# Patient Record
Sex: Male | Born: 1950 | Race: Black or African American | Hispanic: No | Smoking: Former smoker
Health system: Southern US, Community
[De-identification: ages and names within clinical notes are randomized; demographics above are authoritative.]

## PROBLEM LIST (undated history)

## (undated) DIAGNOSIS — K219 Gastro-esophageal reflux disease without esophagitis: Secondary | ICD-10-CM

## (undated) DIAGNOSIS — E039 Hypothyroidism, unspecified: Secondary | ICD-10-CM

## (undated) DIAGNOSIS — I10 Essential (primary) hypertension: Secondary | ICD-10-CM

## (undated) DIAGNOSIS — D3A026 Benign carcinoid tumor of the rectum: Secondary | ICD-10-CM

## (undated) DIAGNOSIS — N400 Enlarged prostate without lower urinary tract symptoms: Secondary | ICD-10-CM

## (undated) DIAGNOSIS — R7303 Prediabetes: Secondary | ICD-10-CM

## (undated) DIAGNOSIS — K922 Gastrointestinal hemorrhage, unspecified: Secondary | ICD-10-CM

## (undated) DIAGNOSIS — E785 Hyperlipidemia, unspecified: Secondary | ICD-10-CM

## (undated) HISTORY — DX: Essential (primary) hypertension: I10

## (undated) HISTORY — DX: Gastrointestinal hemorrhage, unspecified: K92.2

## (undated) HISTORY — DX: Hyperlipidemia, unspecified: E78.5

## (undated) HISTORY — DX: Benign prostatic hyperplasia without lower urinary tract symptoms: N40.0

## (undated) HISTORY — DX: Prediabetes: R73.03

## (undated) HISTORY — DX: Benign carcinoid tumor of the rectum: D3A.026

## (undated) HISTORY — PX: TONSILLECTOMY: SUR1361

## (undated) HISTORY — DX: Hypothyroidism, unspecified: E03.9

---

## 2006-04-02 ENCOUNTER — Emergency Department (HOSPITAL_COMMUNITY): Admission: EM | Admit: 2006-04-02 | Discharge: 2006-04-03 | Payer: Self-pay | Admitting: Emergency Medicine

## 2009-10-24 ENCOUNTER — Emergency Department (HOSPITAL_COMMUNITY): Admission: EM | Admit: 2009-10-24 | Discharge: 2009-10-24 | Payer: Self-pay | Admitting: Emergency Medicine

## 2009-10-27 ENCOUNTER — Emergency Department (HOSPITAL_COMMUNITY): Admission: EM | Admit: 2009-10-27 | Discharge: 2009-10-27 | Payer: Self-pay | Admitting: Emergency Medicine

## 2009-10-28 ENCOUNTER — Ambulatory Visit (HOSPITAL_COMMUNITY): Admission: RE | Admit: 2009-10-28 | Discharge: 2009-10-28 | Payer: Self-pay | Admitting: Emergency Medicine

## 2011-02-21 LAB — CBC
HCT: 39.3 % (ref 39.0–52.0)
Hemoglobin: 13.2 g/dL (ref 13.0–17.0)
MCHC: 33.7 g/dL (ref 30.0–36.0)
MCV: 88.7 fL (ref 78.0–100.0)
RDW: 12.5 % (ref 11.5–15.5)

## 2011-02-21 LAB — DIFFERENTIAL
Basophils Absolute: 0 10*3/uL (ref 0.0–0.1)
Basophils Relative: 1 % (ref 0–1)
Eosinophils Absolute: 0.6 10*3/uL (ref 0.0–0.7)
Eosinophils Relative: 8 % — ABNORMAL HIGH (ref 0–5)
Monocytes Absolute: 0.5 10*3/uL (ref 0.1–1.0)

## 2011-02-21 LAB — BASIC METABOLIC PANEL
BUN: 15 mg/dL (ref 6–23)
CO2: 31 mEq/L (ref 19–32)
Glucose, Bld: 92 mg/dL (ref 70–99)
Potassium: 3.8 mEq/L (ref 3.5–5.1)
Sodium: 139 mEq/L (ref 135–145)

## 2012-09-02 LAB — COMPREHENSIVE METABOLIC PANEL
AST: 24 U/L
Albumin: 5.3
BUN: 10 mg/dL (ref 4–21)
Calcium: 9.6 mg/dL
Chloride: 105 mmol/L
Glucose: 85 mg/dL
LDL Cholesterol: 164 mg/dL
Potassium: 4.2 mmol/L
Sodium: 140 mmol/L (ref 137–147)

## 2012-09-02 LAB — CBC: HCT: 41 %

## 2012-09-26 ENCOUNTER — Encounter: Payer: Self-pay | Admitting: *Deleted

## 2012-10-15 ENCOUNTER — Encounter: Payer: Self-pay | Admitting: Gastroenterology

## 2012-10-15 ENCOUNTER — Ambulatory Visit (INDEPENDENT_AMBULATORY_CARE_PROVIDER_SITE_OTHER): Payer: Self-pay | Admitting: Gastroenterology

## 2012-10-15 ENCOUNTER — Other Ambulatory Visit: Payer: Self-pay | Admitting: Gastroenterology

## 2012-10-15 VITALS — BP 154/72 | HR 61 | Temp 97.2°F | Ht 66.0 in | Wt 203.4 lb

## 2012-10-15 DIAGNOSIS — K429 Umbilical hernia without obstruction or gangrene: Secondary | ICD-10-CM

## 2012-10-15 DIAGNOSIS — R195 Other fecal abnormalities: Secondary | ICD-10-CM

## 2012-10-15 NOTE — Patient Instructions (Signed)
We have scheduled you for a colonoscopy with Dr. Darrick Penna. See separate instructions.  Hernia A hernia occurs when an internal organ pushes out through a weak spot in the abdominal wall. Hernias most commonly occur in the groin and around the navel. Hernias often can be pushed back into place (reduced). Most hernias tend to get worse over time. Some abdominal hernias can get stuck in the opening (irreducible or incarcerated hernia) and cannot be reduced. An irreducible abdominal hernia which is tightly squeezed into the opening is at risk for impaired blood supply (strangulated hernia). A strangulated hernia is a medical emergency. Because of the risk for an irreducible or strangulated hernia, surgery may be recommended to repair a hernia. CAUSES   Heavy lifting.  Prolonged coughing.  Straining to have a bowel movement.  A cut (incision) made during an abdominal surgery. HOME CARE INSTRUCTIONS   Bed rest is not required. You may continue your normal activities.  Avoid lifting more than 10 pounds (4.5 kg) or straining.  Cough gently. If you are a smoker it is best to stop. Even the best hernia repair can break down with the continual strain of coughing. Even if you do not have your hernia repaired, a cough will continue to aggravate the problem.  Do not wear anything tight over your hernia. Do not try to keep it in with an outside bandage or truss. These can damage abdominal contents if they are trapped within the hernia sac.  Eat a normal diet.  Avoid constipation. Straining over long periods of time will increase hernia size and encourage breakdown of repairs. If you cannot do this with diet alone, stool softeners may be used. SEEK IMMEDIATE MEDICAL CARE IF:   You have a fever.  You develop increasing abdominal pain.  You feel nauseous or vomit.  Your hernia is stuck outside the abdomen, looks discolored, feels hard, or is tender.  You have any changes in your bowel habits or in  the hernia that are unusual for you.  You have increased pain or swelling around the hernia.  You cannot push the hernia back in place by applying gentle pressure while lying down. MAKE SURE YOU:   Understand these instructions.  Will watch your condition.  Will get help right away if you are not doing well or get worse. Document Released: 11/06/2005 Document Revised: 01/29/2012 Document Reviewed: 06/25/2008 [facility] Patient Information 2013 [place], [place].

## 2012-10-15 NOTE — Assessment & Plan Note (Signed)
Colonoscopy in the near future for Hemoccult-positive stool.  I have discussed the risks, alternatives, benefits with regards to but not limited to the risk of reaction to medication, bleeding, infection, perforation and the patient is agreeable to proceed. Written consent to be obtained.

## 2012-10-15 NOTE — Progress Notes (Signed)
Faxed to PCP

## 2012-10-15 NOTE — Assessment & Plan Note (Signed)
Small umbilical hernia. Discussed warning signs. Handout provided.

## 2012-10-15 NOTE — Progress Notes (Signed)
Primary Care Physician:  Tylene Fantasia., PA  Primary Gastroenterologist:  Jonette Eva, MD  Chief Complaint  Patient presents with  . Rectal Bleeding    Heme + cards    HPI:  Tim Cunningham is a 61 y.o. male here to schedule colonoscopy. Recently evaluated [facility] [employer]. He was Hemoccult-positive. No constipation, diarrhea, melena, brbpr.  Occasional soreness around umbilicus. Noticed with lifting. No heartburn. No vomiting, dysphagia. No weight loss. No prior colonoscopy.  Colon cancer in extended family member, maternal great uncle.  Current Outpatient Prescriptions  Medication Sig Dispense Refill  . aspirin 81 MG tablet Take 81 mg by mouth daily.      . pravastatin (PRAVACHOL) 10 MG tablet Take 10 mg by mouth daily.        Allergies as of 10/15/2012  . (No Known Allergies)    Past Medical History  Diagnosis Date  . Hyperlipidemia     Past Surgical History  Procedure Date  . Tonsillectomy     Family History  Problem Relation Age of Onset  . Colon cancer Other     maternal great uncle  . Lung cancer Other     uncles    History   Social History  . Marital Status: Married    Spouse Name: N/A    Number of Children: 5  . Years of Education: N/A   Occupational History  . unemployed    Social History Main Topics  . Smoking status: Former Games developer  . Smokeless tobacco: Not on file     Comment: quit 1999  . Alcohol Use: Yes     Comment: 1-2 drinks a month  . Drug Use: No  . Sexually Active: Not on file   Other Topics Concern  . Not on file   Social History Narrative  . No narrative on file      ROS:  General: Negative for anorexia, weight loss, fever, chills, fatigue, weakness. Eyes: Negative for vision changes.  ENT: Negative for hoarseness, difficulty swallowing , nasal congestion. CV: Negative for chest pain, angina, palpitations, dyspnea on exertion, peripheral edema.  Respiratory: Negative for dyspnea at rest,  dyspnea on exertion, cough, sputum, wheezing.  GI: See history of present illness. GU:  Negative for dysuria, hematuria, urinary incontinence, urinary frequency, nocturnal urination.  MS: Negative for joint pain, low back pain.  Derm: Negative for rash or itching.  Neuro: Negative for weakness, abnormal sensation, seizure, frequent headaches, memory loss, confusion.  Psych: Negative for anxiety, depression, suicidal ideation, hallucinations.  Endo: Negative for unusual weight change.  Heme: Negative for bruising or bleeding. Allergy: Negative for rash or hives.    Physical Examination:  BP 154/72  Pulse 61  Temp 97.2 F (36.2 C) (Temporal)  Ht 5\' 6"  (1.676 m)  Wt 203 lb 6.4 oz (92.262 kg)  BMI 32.83 kg/m2   General: Well-nourished, well-developed in no acute distress.  Head: Normocephalic, atraumatic.   Eyes: Conjunctiva pink, no icterus. Mouth: Oropharyngeal mucosa moist and pink , no lesions erythema or exudate. Neck: Supple without thyromegaly, masses, or lymphadenopathy.  Lungs: Clear to auscultation bilaterally.  Heart: Regular rate and rhythm, no murmurs rubs or gallops.  Abdomen: Bowel sounds are normal, nontender, nondistended, no hepatosplenomegaly or masses, no abdominal bruits, no rebound or guarding.  Pea-sized easily reducible umbilical hernia. Rectal: defer Extremities: No lower extremity edema. No clubbing or deformities.  Neuro: Alert and oriented x 4 , grossly normal neurologically.  Skin: Warm and dry, no rash  or jaundice.   Psych: Alert and cooperative, normal mood and affect.  Labs: From 09/02/2012. White blood cell count 6400, hemoglobin 13.7, MCV 86.4, platelets 202,000, sodium 140, potassium 4.2, BUN 10, creatinine 0.79, total bilirubin 0.6, alkaline phosphatase 76, AST 24, ALT 26, albumin 5.3, glucose 85, TSH 5.131, PSA 2.96, total cholesterol 238, HDL 35, LDL 164.  Imaging Studies: No results found.

## 2012-10-20 DIAGNOSIS — D3A026 Benign carcinoid tumor of the rectum: Secondary | ICD-10-CM

## 2012-10-20 HISTORY — DX: Benign carcinoid tumor of the rectum: D3A.026

## 2012-10-25 ENCOUNTER — Encounter (HOSPITAL_COMMUNITY): Payer: Self-pay | Admitting: Pharmacy Technician

## 2012-10-31 MED ORDER — SODIUM CHLORIDE 0.45 % IV SOLN
INTRAVENOUS | Status: DC
  Administered 2012-11-01: 12:00:00 via INTRAVENOUS

## 2012-11-01 ENCOUNTER — Encounter (HOSPITAL_COMMUNITY): Payer: Self-pay

## 2012-11-01 ENCOUNTER — Encounter (HOSPITAL_COMMUNITY)
Admission: RE | Disposition: A | Discharge status: Home / Self Care | Payer: Self-pay | Source: Ambulatory Visit | Attending: Gastroenterology

## 2012-11-01 ENCOUNTER — Ambulatory Visit (HOSPITAL_COMMUNITY)
Admission: RE | Admit: 2012-11-01 | Discharge: 2012-11-01 | Disposition: A | Discharge status: Home / Self Care | Payer: Self-pay | Source: Ambulatory Visit | Attending: Gastroenterology | Admitting: Gastroenterology

## 2012-11-01 DIAGNOSIS — K2990 Gastroduodenitis, unspecified, without bleeding: Secondary | ICD-10-CM | POA: Insufficient documentation

## 2012-11-01 DIAGNOSIS — K5730 Diverticulosis of large intestine without perforation or abscess without bleeding: Secondary | ICD-10-CM

## 2012-11-01 DIAGNOSIS — D126 Benign neoplasm of colon, unspecified: Secondary | ICD-10-CM

## 2012-11-01 DIAGNOSIS — D129 Benign neoplasm of anus and anal canal: Secondary | ICD-10-CM | POA: Insufficient documentation

## 2012-11-01 DIAGNOSIS — K2970 Gastritis, unspecified, without bleeding: Secondary | ICD-10-CM | POA: Insufficient documentation

## 2012-11-01 DIAGNOSIS — K648 Other hemorrhoids: Secondary | ICD-10-CM | POA: Insufficient documentation

## 2012-11-01 DIAGNOSIS — R195 Other fecal abnormalities: Secondary | ICD-10-CM

## 2012-11-01 DIAGNOSIS — D3A029 Benign carcinoid tumor of the large intestine, unspecified portion: Secondary | ICD-10-CM | POA: Insufficient documentation

## 2012-11-01 DIAGNOSIS — D128 Benign neoplasm of rectum: Secondary | ICD-10-CM | POA: Insufficient documentation

## 2012-11-01 DIAGNOSIS — K2980 Duodenitis without bleeding: Secondary | ICD-10-CM

## 2012-11-01 DIAGNOSIS — D649 Anemia, unspecified: Secondary | ICD-10-CM

## 2012-11-01 DIAGNOSIS — K222 Esophageal obstruction: Secondary | ICD-10-CM | POA: Insufficient documentation

## 2012-11-01 DIAGNOSIS — K429 Umbilical hernia without obstruction or gangrene: Secondary | ICD-10-CM

## 2012-11-01 HISTORY — PX: COLONOSCOPY: SHX5424

## 2012-11-01 HISTORY — PX: ESOPHAGOGASTRODUODENOSCOPY: SHX5428

## 2012-11-01 SURGERY — COLONOSCOPY
Anesthesia: Moderate Sedation

## 2012-11-01 MED ORDER — MIDAZOLAM HCL 5 MG/5ML IJ SOLN
INTRAMUSCULAR | Status: AC
  Filled 2012-11-01: qty 10

## 2012-11-01 MED ORDER — BUTAMBEN-TETRACAINE-BENZOCAINE 2-2-14 % EX AERO
INHALATION_SPRAY | CUTANEOUS | Status: DC | PRN
  Administered 2012-11-01: 2 via TOPICAL

## 2012-11-01 MED ORDER — MEPERIDINE HCL 100 MG/ML IJ SOLN
INTRAMUSCULAR | Status: AC
  Filled 2012-11-01: qty 2

## 2012-11-01 MED ORDER — MEPERIDINE HCL 100 MG/ML IJ SOLN
INTRAMUSCULAR | Status: DC | PRN
  Administered 2012-11-01 (×2): 25 mg via INTRAVENOUS

## 2012-11-01 MED ORDER — MIDAZOLAM HCL 5 MG/5ML IJ SOLN
INTRAMUSCULAR | Status: DC | PRN
  Administered 2012-11-01: 2 mg via INTRAVENOUS
  Administered 2012-11-01 (×2): 1 mg via INTRAVENOUS
  Administered 2012-11-01: 2 mg via INTRAVENOUS

## 2012-11-01 MED ORDER — STERILE WATER FOR IRRIGATION IR SOLN
Status: DC | PRN
  Administered 2012-11-01: 12:00:00

## 2012-11-01 NOTE — Op Note (Addendum)
[facility] [address] [place], [address]   COLONOSCOPY PROCEDURE REPORT  PATIENT: Tim Cunningham, Tim Cunningham  MR#: [id number] BIRTHDATE: 03-06-1951 , 61  yrs. old GENDER: Male ENDOSCOPIST: Jonette Eva, MD REFERRED [id number] Muse, PA PROCEDURE DATE:  11/01/2012 PROCEDURE:   Colonoscopy with cold biopsy polypectomy and Colonoscopy with biopsy INDICATIONS:anemia, HEME POS STOOLS.  TAKES ASA. MEDICATIONS: Demerol 50 mg IV and Versed 5 mg IV  DESCRIPTION OF PROCEDURE:    Physical exam was performed.  Informed consent was obtained from the patient after explaining the benefits, risks, and alternatives to procedure.  The patient was connected to monitor and placed in left lateral position. Continuous oxygen was provided by nasal cannula and IV medicine administered through an indwelling cannula.  After administration of sedation and rectal exam, the patients rectum was intubated and the Pentax Colonoscope [id number]  colonoscope was advanced under direct visualization to the ileum.  The scope was removed slowly by carefully examining the color, texture, anatomy, and integrity mucosa on the way out.  The patient was recovered in endoscopy and discharged home in satisfactory condition.     COLON FINDINGS: Two smooth sessile polyps ranging between 3-71mm in size were found in the sigmoid colon and rectum.  A polypectomy was performed with cold forceps.  , Small lipoma was found at the hepatic flexure and in rectum seen upon the retroflexed view. Multiple biopsies were performed using cold forceps.  , Moderate diverticulosis was noted throughout the entire examined colon.  , Small internal hemorrhoids were found.  , and The mucosa appeared normal in the terminal ileum.   20 cm visualized.  PREP QUALITY: good. CECAL W/D TIME: 24 minutes  COMPLICATIONS: None  ENDOSCOPIC IMPRESSION: 1.   Two sessile polyps ranging between 3-13mm in size were found in the sigmoid colon and  rectum; polypectomy was performed with cold forceps 2.   Small lipoma WITH PILLOW LIKE TEXTURE at the hepatic flexure and in rectum seen upon the retroflexed view; multiple TUNNEL biopsies were performed using cold forceps 3.   Moderate diverticulosis was noted throughout the entire examined colon 4.   Small internal hemorrhoids-MAY ACCOUNT FOR HEME POS STOOLS     RECOMMENDATIONS: AWAIT BIOPSY HIGH FIBER DIET NO SOURCE FOR ANEMIA IDENTIFIED, PROCEED TO EGD TCS IN 10 YEARS       _______________________________ Rosalie DoctorJonette Eva, MD 11/01/2012 2:21 PM Revised: 11/01/2012 2:21 PM    PATIENT NAME:  Tim Cunningham, Tim Cunningham MR#: [id number]

## 2012-11-01 NOTE — Op Note (Signed)
[facility] [address] [place], [address]   ENDOSCOPY PROCEDURE REPORT  PATIENT: Tim Cunningham, Tim Cunningham  MR#: [id number] BIRTHDATE: 1951-01-08 , 61  yrs. old GENDER: Male  ENDOSCOPIST: Jonette Eva, MD REFERRED [id number] Muse, PA  PROCEDURE DATE: 11/01/2012 PROCEDURE:   EGD w/ biopsy  INDICATIONS:anemia, heme pos stools. TAKES ASPIRIN DAILY. MEDICATIONS: TCS+ Versed 1mg  IV TOPICAL ANESTHETIC:   Cetacaine Spray  DESCRIPTION OF PROCEDURE:     Physical exam was performed.  Informed consent was obtained from the patient after explaining the benefits, risks, and alternatives to the procedure.  The patient was connected to the monitor and placed in the left lateral position.  Continuous oxygen was provided by nasal cannula and IV medicine administered through an indwelling cannula.  After administration of sedation, the patients esophagus was intubated and the EC-3890Li ([id number]), EG-2990i ([id number]), and EG-2990i ([id number])  endoscope was advanced under direct visualization to the second portion of the duodenum.  The scope was removed slowly by carefully examining the color, texture, anatomy, and integrity of the mucosa on the way out.  The patient was recovered in endoscopy and discharged home in satisfactory condition.    ESOPHAGUS: A mildly severe Schatzki ring was found at the gastroesophageal junction.   A small hiatal hernia was noted.  STOMACH: Mild non-erosive gastritis (inflammation) was found in the gastric antrum.  Multiple biopsies were performed using cold forceps.  DUODENUM: Duodenitis with bleeding was noted in the duodenal bulb. The duodenal mucosa showed no abnormalities in the 2nd part of the duodenum.  Cold forcep biopsies were taken in the second portion.   COMPLICATIONS:   None  ENDOSCOPIC IMPRESSION: Schatzki ring was found MILD GASTRITIS/DUODENIIS NO OBVIOUS SOURCE FOR ANEMIA IDENTIFIE. HEME POS STOOLS MAY BE DUE TO  GASTRITIS/DUODENITIS/HEMORRHOIDS.   RECOMMENDATIONS: FOLLOW A HIGH FIBER/LOW FAT DIET.  AVOID ITEMS THAT CAUSE BLOATING.   BIOPSY RESULTS SHOULD BE BACK IN 7 DAYS.  If no source for your ANEMIA/HEME POS STOOLS found, then PT NEEDS [facility] ENDOSCOPY.  FOLLOW UP IN 3 MOS.   REPEAT EXAM:   _______________________________ Rosalie DoctorJonette Eva, MD 11/01/2012 2:19 PM       PATIENT NAME:  Marcelis, Wissner MR#: [id number]

## 2012-11-01 NOTE — H&P (Signed)
  Primary Care Physician:  Tylene Fantasia., PA Primary Gastroenterologist:  Dr. Darrick Penna  Pre-Procedure History & Physical: HPI:  Tim Cunningham is a 61 y.o. male here for Anemia/heme pos stools.  Past Medical History  Diagnosis Date  . Hyperlipidemia     Past Surgical History  Procedure Date  . Tonsillectomy     Prior to Admission medications   Medication Sig Start Date End Date Taking? Authorizing Provider  aspirin 81 MG tablet Take 81 mg by mouth every other day.    Yes Historical Provider, MD  pravastatin (PRAVACHOL) 10 MG tablet Take 10 mg by mouth daily.   Yes Historical Provider, MD    Allergies as of 10/15/2012  . (No Known Allergies)    Family History  Problem Relation Age of Onset  . Colon cancer Other     maternal great uncle  . Lung cancer Other     uncles    History   Social History  . Marital Status: Divorced    Spouse Name: N/A    Number of Children: 5  . Years of Education: N/A   Occupational History  . unemployed    Social History Main Topics  . Smoking status: Former Smoker -- 0.5 packs/day for 10 years    Types: Cigarettes    Quit date: 11/01/1981  . Smokeless tobacco: Not on file     Comment: quit 1999  . Alcohol Use: Yes     Comment: 1-2 drinks a month  . Drug Use: No  . Sexually Active: Yes    Birth Control/ Protection: None   Other Topics Concern  . Not on file   Social History Narrative  . No narrative on file    Review of Systems: See HPI, otherwise negative ROS   Physical Exam: BP 154/81  Pulse 63  Temp 97.7 F (36.5 C) (Oral)  Resp 16  Ht 5\' 6"  (1.676 m)  Wt 203 lb (92.08 kg)  BMI 32.76 kg/m2  SpO2 97% General:   Alert,  pleasant and cooperative in NAD Head:  Normocephalic and atraumatic. Neck:  Supple; Lungs:  Clear throughout to auscultation.    Heart:  Regular rate and rhythm. Abdomen:  Soft, nontender and nondistended. Normal bowel sounds, without guarding, and without rebound.   Neurologic:  Alert and   oriented x4;  grossly normal neurologically.  Impression/Plan:    HEME POS STOOLS/anemia HB 13.2 ON ASA  PLAN:  1.TCS?EGD

## 2012-11-05 ENCOUNTER — Encounter (HOSPITAL_COMMUNITY): Payer: Self-pay | Admitting: Gastroenterology

## 2012-11-06 ENCOUNTER — Telehealth: Payer: Self-pay | Admitting: Gastroenterology

## 2012-11-11 MED ORDER — OMEPRAZOLE 20 MG PO CPDR: DELAYED_RELEASE_CAPSULE | ORAL | Status: DC

## 2012-11-11 NOTE — Telephone Encounter (Signed)
Called patient TO DISCUSS RESULTS. LVM-CALL [phone] TO DISCUSS.  His stomach Bx shows mild gastritis. Start OMEPRAZOLE EVERY MORNING. He had A HYPERPLASTIC POLYP removed from hIS colon. 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 HE NEEDS A CT ABD/PELVIS W/ IVC. He needs a RECTAL ULTRASOUND(DR. OUTLAW) TO EXCISE THE LESION. HE WILL NEED TO SEE DR. Mariel Sleet.

## 2012-11-12 ENCOUNTER — Other Ambulatory Visit: Payer: Self-pay | Admitting: Gastroenterology

## 2012-11-12 ENCOUNTER — Telehealth (HOSPITAL_COMMUNITY): Payer: Self-pay | Admitting: Oncology

## 2012-11-12 DIAGNOSIS — K629 Disease of anus and rectum, unspecified: Secondary | ICD-10-CM

## 2012-11-12 NOTE — Telephone Encounter (Addendum)
PT Called TO DISCUSS RESULTS. EXPLAINED FINDINGS ON EGD/TCS. NEEDS TO TAKE OMEPRAZOLE TO PREVENT REFLUX AND PREVENT ULCERS. NEEDS CARCINOID IN THE RECTUM TO BE ADDRESSED. ASSURED PT IT IS NOT SERIOUS BUT IT NEEDS TO BE ADDRESSED. EXPLAINED HE WILL NEED A CT SCAN, AND APPTS WITH DRS. OUTLAW AND NEIJSTROM.  PT'S MILD ANEMIA MOST LIKELY DUE TO NSAID GASTRITIS +/- RECTAL CARCINOID. OPV IN 3 MOS E 30 W/ SLF DX: ANEMIA, RECTAL CARCINOID.

## 2012-11-15 ENCOUNTER — Encounter: Payer: Self-pay | Admitting: Gastroenterology

## 2012-11-15 NOTE — Telephone Encounter (Signed)
Patient is scheduled for CT abd/pel on Tues Dec 31st at 11:00 Referrals have been sent to Dr. Mariel Sleet and Dr. Dulce Sellar and Patient is aware

## 2012-11-15 NOTE — Telephone Encounter (Signed)
Pt is aware of OV on 3/6 at 10 with SF and appt card was mailed

## 2012-11-19 ENCOUNTER — Ambulatory Visit (HOSPITAL_COMMUNITY)
Admission: RE | Admit: 2012-11-19 | Discharge: 2012-11-19 | Disposition: A | Discharge status: Home / Self Care | Payer: Self-pay | Source: Ambulatory Visit | Attending: Gastroenterology | Admitting: Gastroenterology

## 2012-11-19 DIAGNOSIS — K6289 Other specified diseases of anus and rectum: Secondary | ICD-10-CM | POA: Insufficient documentation

## 2012-11-19 DIAGNOSIS — K629 Disease of anus and rectum, unspecified: Secondary | ICD-10-CM

## 2012-11-19 MED ORDER — IOHEXOL 300 MG/ML  SOLN
100.0000 mL | Freq: Once | INTRAMUSCULAR | Status: AC | PRN
  Administered 2012-11-19: 100 mL via INTRAVENOUS

## 2012-11-21 NOTE — Telephone Encounter (Signed)
PLEASE CALL PT.  HIS CT DID NOT SHOW ANY EVIDENCE FOR THE CARCINOID TUMOR INHIS ABDOMEN OR PELVIS. HE SHOULD COMPLETE THE RECTAL U/S AND NEW PT VISIT WITH DRS. OUTLAW/NEIJSTROM. OPV W/ SLF IN [facility] 2014.

## 2012-11-21 NOTE — Telephone Encounter (Signed)
Results faxed to PCP, appt made 

## 2012-11-21 NOTE — Telephone Encounter (Signed)
LMOM to call.

## 2012-11-22 ENCOUNTER — Telehealth: Payer: Self-pay | Admitting: Gastroenterology

## 2012-11-22 NOTE — Telephone Encounter (Signed)
LMOM to call.

## 2012-11-22 NOTE — Telephone Encounter (Signed)
Pt returned call and was informed.  

## 2012-11-22 NOTE — Telephone Encounter (Signed)
I received a call from Dr Hulen Shouts office this morning. They were letting us know that patient had no showed his appointment yesterday where we had referred him.

## 2012-11-23 NOTE — Telephone Encounter (Signed)
Contact pt to [facility]

## 2012-11-25 NOTE — Telephone Encounter (Signed)
Patient is R/S for 01/27 with Dr. Dulce Sellar, Patients daughter Lowanda Foster would like to talk to Dr. Darrick Penna she has questions Regarding her father and Mr. Novelo is giving permission for Dr. Darrick Penna to talk to her she can be reached at [phone]

## 2012-11-25 NOTE — Telephone Encounter (Signed)
REVIEWED.  

## 2012-12-02 ENCOUNTER — Ambulatory Visit (HOSPITAL_COMMUNITY): Payer: Self-pay | Admitting: Oncology

## 2012-12-02 NOTE — Telephone Encounter (Signed)
SPOKE TO PT'S DAUGHTER. EXPLAINED FINDINGS IN ENDOSCOPY. SHE HAD QUESTIONS ABOUT GALLSTONES ON CT, HIATAL HERNIA, AND MANAGEMENT OF HIS RECTAL CARCINOID. ANSWERED QUESTIONS. SHE VOICED HER UNDERSTANDING. HIATAL HERNIA DON'T USUALLY CAUSE GREAT CONCERN OR NEED SURGERY. THEY DO NOT LIMIT HIS ACTIVITY. HE ONLY NEEDS SURGERY FOR GALLSTONES IF HE IS SYMPTOMATIC. HE MAY HAVE HIS CARCINOID REMOVED VIA RUS OR SURGERY. HE SHOULD SEE AN ONC MD DUE TO THE FACT THAT CARCINOID IS A TUMOR.

## 2012-12-09 ENCOUNTER — Encounter (HOSPITAL_COMMUNITY): Payer: Self-pay | Admitting: Pharmacy Technician

## 2012-12-11 ENCOUNTER — Ambulatory Visit (HOSPITAL_COMMUNITY)
Admission: RE | Admit: 2012-12-11 | Discharge: 2012-12-11 | Disposition: A | Discharge status: Home / Self Care | Payer: Self-pay | Source: Ambulatory Visit | Attending: Gastroenterology | Admitting: Gastroenterology

## 2012-12-11 ENCOUNTER — Encounter (HOSPITAL_COMMUNITY): Payer: Self-pay

## 2012-12-11 ENCOUNTER — Encounter (HOSPITAL_COMMUNITY)
Admission: RE | Disposition: A | Discharge status: Home / Self Care | Payer: Self-pay | Source: Ambulatory Visit | Attending: Gastroenterology

## 2012-12-11 ENCOUNTER — Encounter (HOSPITAL_COMMUNITY): Payer: Self-pay | Admitting: *Deleted

## 2012-12-11 DIAGNOSIS — K620 Anal polyp: Secondary | ICD-10-CM | POA: Insufficient documentation

## 2012-12-11 DIAGNOSIS — K621 Rectal polyp: Secondary | ICD-10-CM | POA: Insufficient documentation

## 2012-12-11 DIAGNOSIS — D3A026 Benign carcinoid tumor of the rectum: Secondary | ICD-10-CM | POA: Insufficient documentation

## 2012-12-11 HISTORY — PX: EUS: SHX5427

## 2012-12-11 HISTORY — DX: Gastro-esophageal reflux disease without esophagitis: K21.9

## 2012-12-11 SURGERY — ULTRASOUND, LOWER GI TRACT, ENDOSCOPIC
Anesthesia: Moderate Sedation

## 2012-12-11 MED ORDER — MIDAZOLAM HCL 10 MG/2ML IJ SOLN
INTRAMUSCULAR | Status: AC
  Filled 2012-12-11: qty 4

## 2012-12-11 MED ORDER — FENTANYL CITRATE 0.05 MG/ML IJ SOLN
INTRAMUSCULAR | Status: DC | PRN
  Administered 2012-12-11 (×2): 25 ug via INTRAVENOUS

## 2012-12-11 MED ORDER — FENTANYL CITRATE 0.05 MG/ML IJ SOLN
INTRAMUSCULAR | Status: AC
  Filled 2012-12-11: qty 4

## 2012-12-11 MED ORDER — SPOT INK MARKER SYRINGE KIT
PACK | SUBMUCOSAL | Status: AC
  Filled 2012-12-11: qty 5

## 2012-12-11 MED ORDER — DIPHENHYDRAMINE HCL 50 MG/ML IJ SOLN
INTRAMUSCULAR | Status: AC
  Filled 2012-12-11: qty 1

## 2012-12-11 MED ORDER — SODIUM CHLORIDE 0.9 % IV SOLN
INTRAVENOUS | Status: DC
  Administered 2012-12-11: 500 mL via INTRAVENOUS

## 2012-12-11 MED ORDER — MIDAZOLAM HCL 10 MG/2ML IJ SOLN
INTRAMUSCULAR | Status: DC | PRN
  Administered 2012-12-11 (×2): 2 mg via INTRAVENOUS

## 2012-12-11 NOTE — H&P (Signed)
Patient interval history reviewed.  Patient examined again.  There has been no change from documented H/P dated 11/22/12 (scanned into chart from our office) except as documented above.  Assessment:  1.  Rectal carcinoid.  Plan:  1.  Endorectal ultrasound to better assess size and potential resectability. 2.  Sigmoidoscopy with polypectomy of carcinoid, if seems appropriate after endorectal ultrasound. 3.  Risks (bleeding, infection, bowel perforation that could require surgery, sedation-related changes in cardiopulmonary systems), benefits (identification and possible treatment of source of symptoms, exclusion of certain causes of symptoms), and alternatives (watchful waiting, radiographic imaging studies, empiric medical treatment) of endorectal ultrasound as well as possible sigmoidoscopy with polypectomy and tattoo of polypectomy site were explained to patient/daughter in detail, and patient wishes to proceed.

## 2012-12-11 NOTE — Op Note (Signed)
[facility] [address] [place], [address]   OPERATIVE PROCEDURE REPORT  PATIENT: Tim Cunningham, Tim Cunningham  MR#: [id number] BIRTHDATE: [birth year]  GENDER: Male ENDOSCOPIST: Willis Modena, MD REFERRED BY:  Jonette Eva, M.D. PROCEDURE DATE:  12/11/2012 PROCEDURE:   Endorectal ultrasound; sigmoidoscopy with snare polypectomy; sigmoidoscopy with submucosal injection ASA CLASS:   Class II INDICATIONS:1.  rectal carcinoid. MEDICATIONS: Fentanyl 50 mcg IV, Versed 4 mg IV  DESCRIPTION OF PROCEDURE:   After the risks benefits and alternatives of the procedure were thoroughly explained, informed consent was obtained.  Throughout the procedure, the patients blood pressure, pulse and oxygen saturations were monitored continuously. Under direct visualization, the Radial EUS [id number]  endoscope was introduced through the anus  and advanced to the sigmoid colon .  Water was used as necessary to provide an acoustic interface. Imaging was obtained at 7.5 and . Upon completion of the imaging, water was removed and the patient was sent to the recovery room in satisfactory condition.    FINDINGS:   Digital rectal exam normal.  Radial EUS scope inserted into the rectum, and water gently instilled in the lumen to facilitate acoustic coupling.  The polyp was endoscopically and endoultrasonographically visualized in the distal rectum, best seen in retroflexed views; approximately 8mm in maximal diameter, originates in submucosa and extends into the mucosa; much of the submucosa and the entire muscularis propria are deep to the lesion. There is no neighboring adenopathy. After completion of this exam, scopes were exchanged in favor of a diagnostic endoscope.  The polyp was removed with snare cautery without difficulty; 3cc of [place] Ink were injected around the rim of the polypectomy site. There were no immediate complications.  ENDOSCOPIC IMPRESSION: Small distal rectal  carcinoid, removed and polypectomy site tattooed as above.  RECOMMENDATIONS: 1.  Watch for potential complications of procedure. 2.  Await polypectomy results. 3.  Will discuss with Dr. Darrick Penna. 4.  Consider repeat visualization of this area by Dr. Darrick Penna in 1-2 years, perhaps in conjunction with a complete colonoscopy.   _______________________________ eSigned:  Willis Modena, MD 12/11/2012 9:14 AM   CC:

## 2012-12-12 ENCOUNTER — Encounter (HOSPITAL_COMMUNITY): Payer: Self-pay | Admitting: Gastroenterology

## 2012-12-19 ENCOUNTER — Telehealth: Payer: Self-pay | Admitting: *Deleted

## 2012-12-19 NOTE — Telephone Encounter (Signed)
Called. All circuits are busy now.

## 2012-12-19 NOTE — Telephone Encounter (Signed)
Called pt's number. He said he has no questions, but his daughter would like to speak with [place] and it is OK. Said her number was in our files. I called the number for Maurilio Puryear at [phone] and was told that no one by that name lives there.

## 2012-12-19 NOTE — Telephone Encounter (Signed)
I called pt back and told him to have his daughter call me and I will be glad to speak with her.

## 2012-12-19 NOTE — Telephone Encounter (Signed)
Britney, pt's daughter returned my call. She wanted to know why pt was scheduled for Dr. 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 She has some questions and would like for Dr. Darrick Penna to call her sometime tomorrow at [phone].

## 2012-12-19 NOTE — Telephone Encounter (Signed)
Ms Nill called today regarding her dad. She would like a call back from Dr Darrick Penna as to why he is going to see Dr Mariel Sleet. Please follow up. Thank you.

## 2012-12-20 NOTE — Telephone Encounter (Addendum)
CALLED PT'S DAUGHTER.  I have attempted without success to contact this patient by phone. NO VOICEMAIL SETUP.

## 2012-12-23 NOTE — Telephone Encounter (Signed)
I called Tim Cunningham. She said she will be at the following phone number until 3:00 PM today. [phone].

## 2012-12-24 NOTE — Telephone Encounter (Signed)
CALLED PT'S DAUGHTER.  I have attempted without success to contact this patient by phone. NO VOICEMAIL SETUP. 

## 2012-12-25 ENCOUNTER — Encounter (HOSPITAL_COMMUNITY): Payer: Self-pay | Admitting: Oncology

## 2012-12-25 ENCOUNTER — Encounter (HOSPITAL_COMMUNITY): Payer: Self-pay | Attending: Oncology | Admitting: Oncology

## 2012-12-25 VITALS — BP 126/73 | HR 63 | Temp 97.3°F | Resp 18 | Ht 66.5 in | Wt 199.0 lb

## 2012-12-25 DIAGNOSIS — K449 Diaphragmatic hernia without obstruction or gangrene: Secondary | ICD-10-CM | POA: Insufficient documentation

## 2012-12-25 DIAGNOSIS — C7A026 Malignant carcinoid tumor of the rectum: Secondary | ICD-10-CM

## 2012-12-25 DIAGNOSIS — Z09 Encounter for follow-up examination after completed treatment for conditions other than malignant neoplasm: Secondary | ICD-10-CM | POA: Insufficient documentation

## 2012-12-25 DIAGNOSIS — D3A026 Benign carcinoid tumor of the rectum: Secondary | ICD-10-CM | POA: Insufficient documentation

## 2012-12-25 DIAGNOSIS — E7800 Pure hypercholesterolemia, unspecified: Secondary | ICD-10-CM | POA: Insufficient documentation

## 2012-12-25 DIAGNOSIS — Z859 Personal history of malignant neoplasm, unspecified: Secondary | ICD-10-CM | POA: Insufficient documentation

## 2012-12-25 DIAGNOSIS — K5900 Constipation, unspecified: Secondary | ICD-10-CM | POA: Insufficient documentation

## 2012-12-25 DIAGNOSIS — D3A00 Benign carcinoid tumor of unspecified site: Secondary | ICD-10-CM

## 2012-12-25 NOTE — Progress Notes (Signed)
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 Dulce Sellar however with tumor nests seen at the resection border. He presented to the health Department and was found to have blood in his stool after a routine evaluation. He was then referred to Dr. Darrick Penna who did EGD and colonoscopy. EGD revealed benign findings with mild gastritis/duodenitis. Colonoscopy however revealed 2 polyps one in the sigmoid colon one of the rectum that were removed. The rectal lesion revealed carcinoid. He was then referred to Dr. Dulce Sellar on January 22 try to remove the base of the lesion. The above findings were found. He is asymptomatic presently and was asymptomatic before the above procedure. #2 hypercholesterolemia on therapy #3 excessive weight #4 hiatal hernia #5 gallstones, asymptomatic #6 large callus medial right foot secondary to his work #7 occasional constipation  Very pleasant gentleman accompanied by his daughter [place]. He is totally asymptomatic on oncology review of systems. Specifically he has no flushing, no diarrhea, no loss of appetite, no weight reduction etc.  Both parents are deceased. He was one of 5 sons born to his parents. They know nothing about the health history of his father. His mother died of some type of intra-abdominal cancer. This occurred in 2000. He smoked for approximately 15 years one half pack of cigarettes a day. He does not use alcohol. He is retired. He worked at [employer] for many years.  BP 126/73  Pulse 63  Temp 97.3 F (36.3 C) (Oral)  Resp 18  Ht 5' 6.5" (1.689 m)  Wt 199 lb (90.266 kg)  BMI 31.64 kg/m2  Pleasant gentleman in no acute distress. Teeth in fair repair. Throat is clear. Pupils equally round and reactive to light. He is alert and oriented. Eyes appear to have cataracts bilaterally. Facial symmetry is intact. He has no obvious thyromegaly. Lungs are clear. Heart shows a regular rhythm and rate without murmur rub or gallop. Abdomen is  soft and nontender without hepatosplenomegaly. Bowel sounds are diminished but present. He has no leg edema. Pulses are 2+ and symmetrical. Medial right foot shows one and half centimeter callus extending 1 cm off the skin line.  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 He would like to try the same his rectum if possible. CAT scan with dye did not reveal metastatic disease. He needs a serotonin level and urine for 5 HIAA. I would like to discuss his case with Dr. Dulce Sellar. We may get a general surgical consultation potentially. Presently he appears asymptomatic from these gallstones as well. We will see him back and he will bring his urine Friday and we will check his blood work on that same day.

## 2012-12-25 NOTE — Patient Instructions (Addendum)
[facility] Discharge Instructions  RECOMMENDATIONS MADE BY THE CONSULTANT AND ANY TEST RESULTS WILL BE SENT TO YOUR REFERRING PHYSICIAN.  EXAM FINDINGS BY THE PHYSICIAN TODAY AND SIGNS OR SYMPTOMS TO REPORT TO CLINIC OR PRIMARY PHYSICIAN: Exam and discussion by MD.  Tim Cunningham had a carcinoid tumor in your rectum.  This is a less common area for this to occur.  It's a low-grade malignancy and we will need to check some blood work and have you bring in a 24 hour urine collection.  If the levels are abnormal we may need to do some additional testing.   MEDICATIONS PRESCRIBED:  none  INSTRUCTIONS GIVEN AND DISCUSSED: Report changes in bowel habits, diarrhea, flushing sensation, etc.  SPECIAL INSTRUCTIONS/FOLLOW-UP: Return in 6 months with another urine collection and blood work and to be seen in follow-up by PA.  Thank you for choosing [facility] to provide your oncology and hematology care.  To afford each patient quality time with our providers, please arrive at least 15 minutes before your scheduled appointment time.  With your help, our goal is to use those 15 minutes to complete the necessary work-up to ensure our physicians have the information they need to help with your evaluation and healthcare recommendations.    Effective January 1st, 2014, we ask that you re-schedule your appointment with our physicians should you arrive 10 or more minutes late for your appointment.  We strive to give you quality time with our providers, and arriving late affects you and other patients whose appointments are after yours.    Again, thank you for choosing [facility].  Our hope is that these requests will decrease the amount of time that you wait before being seen by our physicians.       _____________________________________________________________  Should you have questions after your visit to [facility], please contact our office at [phone]  [phone] between the hours of 8:30 a.m. and 5:00 p.m.  Voicemails left after 4:30 p.m. will not be returned until the following business day.  For prescription refill requests, have your pharmacy contact our office with your prescription refill request.      24-Hour Urine Collection HOME CARE  When you get up in the morning on the day you do this test, pee (urinate) in the toilet and flush. Make a note of the time. This will be your start time on the day of collection and the end time on the next morning.   From then on, save all your pee (urine) in the plastic jug that was given to you.   You should stop collecting your pee 24 hours after you started.   If the plastic jug that is given to you already has liquid in it, that is okay. Do not throw out the liquid or rinse out the jug. Some tests need the liquid to be added to your pee.   Keep your plastic jug cool (in an ice chest or the refrigerator) during the test.   When the 24 hours is over, bring your plastic jug to the clinic lab. Keep the jug cool (in an ice chest) while you are bringing it to the lab.  Document Released: 02/02/2009 Document Revised: 01/29/2012 Document Reviewed: 02/02/2009 [facility] Patient Information 2013 [place], [place].   Carcinoid Syndrome Carcinoid syndrome is a group of symptoms (the main one is flushing) caused by tumors called carcinoids. Carcinoids secrete hormones (serotonin) and chemicals that cause the symptoms. This happens  after they have spread (metastasized). The tumors can occur anywhere. But about 70% of them start out in the appendix or small bowel. Carcinoid syndrome usually affects adults ages 63 to 107, and affects both sexes equally. Carcinoids grow slowly and are often not cancerous (benign). But they can spread and are then cancerous (malignant). In a few cases, carcinoid cells spread to other body parts. There they produce secondary, hormone-producing (serotonin) tumors.   CAUSES   There are no  preventive measures which can be taken to prevent this illness. But smoking, increasing age, and a family history for this type of tumor seem to be connected with increased occurrence. Symptoms are caused by secretions of the tumor. Some of the triggers which cause the tumor to release the hormone and bring on symptoms are:  Heavy exercise.   Tomatoes.   Pineapple.   Alcohol.   Plums.   Walnuts.   Bananas.   Avocados.   Cheese.  SYMPTOMS  Many people have no symptoms. The main symptom is flushing (like a hot flash) of the head and neck. Others may have symptoms that include:  Flushed skin on the head and neck.   Diarrhea with abdominal cramps.   Irregular heartbeat.   Low blood pressure.   Unexplained weight loss.   Watery eyes.   Respiratory symptoms similar to asthma.   Nausea and vomiting.   Sexual dysfunction in men.   Disease of the heart valves.  DIAGNOSIS   Your caregiver will do a physical exam and ask questions about your symptoms. A number of medical tests will be done.    Nonfunctioning carcinoids. This is a carcinoid which is not producing hormones or symptoms. These can be detected similarly to other growths by angiography, CT, or MRI (all specialized x-rays), depending on the site. Small-bowel carcinoids may show abnormalities on barium x-ray studies. A final diagnosis is made by taking a part of the growth out and looking at it under a microscope.   Functioning carcinoids. These produce hormones and problems. They are suspected on the basis of the symptoms. The diagnosis is confirmed by a urine check which shows increased amounts of the serotonin breakdown product, 5-hydroxyindoleacetic acid (5-HIAA). A test is performed after the patient has stayed away from serotonin-containing foods (for example, bananas, tomatoes, plums, avocados, pineapples, eggplant, and walnuts) for 3 days to avoid false-positive results. On the 3rd day, a 24-h urine sample is  collected for testing. Normal excretion of 5-HIAA is < 10 mg/day (< 52 mol/day). In patients with carcinoid syndrome it is usually > 50 mg/day (> 260 mol/ day).   Tests with calcium gluconate, catecholamines, pentagastrin, or alcohol have been used to cause flushing. These may help in the diagnosis in some patients but must be performed with care.   Localization of the tumor may require an extensive evaluation, including laparotomy. A liver scan may help show spreading (metastases).   The rare tumors mentioned above must be excluded by the correct examinations.  The tests first help diagnose the cancer and then determine if it has spread (staging).   TREATMENT   Treatment varies and depends on:  The location and size of the tumor.   Any spread of the cancer.   Your health, age, and preferences.  Treatment may include:  Surgery.   Anticancer medications (chemotherapy).   Other medications.  Treatments listed below may be used to control symptoms:  Antidiarrheal medications.   Medications to prevent serotonin production.   Cortisone medications to  reduce inflammation.   Medications to prevent flushed skin.   Anticancer medications (they do not cure these tumors, but may help symptoms).   Avoid foods that trigger symptoms.   Include at least 2 servings of protein a day.   Multivitamins and niacin supplements.   Do not drink alcohol.   Resume your normal activities once symptoms improve. Avoid strenuous exercise.  Surgery is curative if the entire tumor can be removed. Sometimes it can spread to local lymph nodes (your glands), which is cured with surgical removal. If surgery is not completely successful, the symptoms can often be helped with the medications listed above. Chemotherapy or medical treatment of the tumor is usually unsuccessful.   RELATED COMPLICATIONS  High blood pressure.   Bowel obstruction.   Disease of the heart valves.   Renal failure.   Risk  for stroke, blood clots, and similar disorders.   Heart failure.   Hives (angioedema).  If the carcinoid tumor can be removed completely, there can be a cure. Even removing a large part of a tumor that has spread, helps cut down on symptoms. This is because there is less tumor size to produce the hormone causing the symptoms. FOR MORE INFORMATION National Cancer Institute: www.cancer.gov Document Released: 06/20/2004 Document Revised: 01/29/2012 Document Reviewed: 10/30/2008 [facility] Patient Information 2013 [place], [place].

## 2012-12-27 ENCOUNTER — Encounter (HOSPITAL_BASED_OUTPATIENT_CLINIC_OR_DEPARTMENT_OTHER): Payer: Self-pay

## 2012-12-27 DIAGNOSIS — C7A026 Malignant carcinoid tumor of the rectum: Secondary | ICD-10-CM

## 2012-12-27 DIAGNOSIS — D3A00 Benign carcinoid tumor of unspecified site: Secondary | ICD-10-CM

## 2012-12-27 LAB — COMPREHENSIVE METABOLIC PANEL
ALT: 35 U/L (ref 0–53)
Calcium: 9.9 mg/dL (ref 8.4–10.5)
Creatinine, Ser: 0.93 mg/dL (ref 0.50–1.35)
GFR calc Af Amer: >90 mL/min (ref >90)
GFR calc non Af Amer: 88 mL/min — ABNORMAL LOW (ref >90)
Glucose, Bld: 103 mg/dL — ABNORMAL HIGH (ref 70–99)
Sodium: 138 mEq/L (ref 135–145)
Total Protein: 7.8 g/dL (ref 6.0–8.3)

## 2012-12-27 LAB — CBC WITH DIFFERENTIAL/PLATELET
Basophils Absolute: 0 10*3/uL (ref 0.0–0.1)
Eosinophils Absolute: 0.2 10*3/uL (ref 0.0–0.7)
Eosinophils Relative: 3 % (ref 0–5)
HCT: 41 % (ref 39.0–52.0)
Lymphs Abs: 1.9 10*3/uL (ref 0.7–4.0)
MCH: 29.3 pg (ref 26.0–34.0)
MCV: 87 fL (ref 78.0–100.0)
Monocytes Absolute: 0.7 10*3/uL (ref 0.1–1.0)
Platelets: 183 10*3/uL (ref 150–400)
RDW: 12.5 % (ref 11.5–15.5)

## 2012-12-27 NOTE — Progress Notes (Signed)
Tim Cunningham presented for labwork. Labs per MD order drawn via Peripheral Line 25 gauge needle inserted in RAC.  Procedure without incident.  Patient tolerated procedure well.  Urine collection brought in by patient, new specimen bottle given to patient for future labs.

## 2012-12-31 LAB — SEROTONIN SERUM: Serotonin, Serum: 127 ng/mL (ref 56–244)

## 2013-01-03 ENCOUNTER — Other Ambulatory Visit (HOSPITAL_COMMUNITY): Payer: Self-pay | Admitting: Oncology

## 2013-01-03 NOTE — Progress Notes (Signed)
After discussing his case with Dr. Dulce Sellar, we both feel that he can be followed with periodic sigmoidoscopies/colonoscopies for this carcinoid process. We both agree that he does not need definitive surgical intervention other than when he has had done already. It is of note that his serum seroton and urine for 5 HIAA as well as in the normal range.  We may never need to definitively operated on this gentleman potentially.

## 2013-01-04 ENCOUNTER — Other Ambulatory Visit: Payer: Self-pay

## 2013-01-06 ENCOUNTER — Ambulatory Visit (HOSPITAL_COMMUNITY): Payer: Self-pay | Admitting: Oncology

## 2013-01-06 ENCOUNTER — Encounter (HOSPITAL_COMMUNITY): Payer: Self-pay | Admitting: Oncology

## 2013-01-16 NOTE — Progress Notes (Signed)
TCS DEC 2013 LIPOMA, CARCINOID in rectum, HYPERPLASTIC POLYPS CT DEC 2013 NO METASTATIC DISEASE JAN 2014 ENDOCSCOPIC MUCOSAL RESECTION (WO)  EGD DEC 2013 GASTRITIS, NL DUO Bx   REVIEWED.

## 2013-01-22 ENCOUNTER — Encounter: Payer: Self-pay | Admitting: Gastroenterology

## 2013-01-23 ENCOUNTER — Encounter: Payer: Self-pay | Admitting: Gastroenterology

## 2013-01-23 ENCOUNTER — Ambulatory Visit (INDEPENDENT_AMBULATORY_CARE_PROVIDER_SITE_OTHER): Payer: Self-pay | Admitting: Gastroenterology

## 2013-01-23 VITALS — BP 153/71 | HR 63 | Temp 98.4°F | Ht 66.0 in | Wt 204.2 lb

## 2013-01-23 DIAGNOSIS — R195 Other fecal abnormalities: Secondary | ICD-10-CM

## 2013-01-23 DIAGNOSIS — Z1211 Encounter for screening for malignant neoplasm of colon: Secondary | ICD-10-CM | POA: Insufficient documentation

## 2013-01-23 DIAGNOSIS — D3A026 Benign carcinoid tumor of the rectum: Secondary | ICD-10-CM

## 2013-01-23 MED ORDER — OMEPRAZOLE 20 MG PO CPDR: 20.0000 mg | DELAYED_RELEASE_CAPSULE | Freq: Every morning | ORAL | Status: DC

## 2013-01-23 NOTE — Progress Notes (Signed)
  Subjective:    Patient ID: Tim Cunningham, male    DOB: 12-22-1950, 62 y.o.   MRN: [id number]  PCP: [facility] 1o ONC: Mariel Sleet ADDITIONAL GI DOC: OUTLAW  HPI PT SAW DR. Dulce Sellar FOR EXCISION. MARGINS STILL HAD TUMOR. SAW DR. Mariel Sleet, WHO FELT TOTAL EXCISION NEEDED. URINE 5-HIAA AND BLOOD:SEROTININ LEVELS ARE NL. NEXT APPT WITH [facility]-AUG 2014.  PT DENIES FEVER, CHILLS, BRBPR, nausea, vomiting, melena, diarrhea, constipation, abd pain, problems swallowing, problems with sedation DURING LAST TCS/EGD, heartburn or indigestion.  Past Medical History  Diagnosis Date  . Hyperlipidemia   . GERD (gastroesophageal reflux disease)   . 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 Tonsillectomy    . Colonoscopy  11/01/2012    WUJ:WJXBJ internal hemorrhoids/Moderate diverticulosis/Small lipoma WITH PILLOW LIKE TEXTURE at the hepatic flexure/Two sessile polyps ranging between 3-59mm   . Esophagogastroduodenoscopy  11/01/2012    YNW:GNFAOZHY ring was found/MILD GASTRITIS/DUODENIIS  . Eus  12/11/2012    RECTAL CARCINOID EXCISED-MARGINS NOT CLEAR    No Known Allergies  Current Outpatient Prescriptions  Medication Sig Dispense Refill  . aspirin EC 81 MG tablet Take 81 mg by mouth every other day.      Marland Kitchen azelastine (ASTELIN) 137 MCG/SPRAY nasal spray Place 1 spray into the nose daily as needed. For allergies.      Marland Kitchen loratadine (CLARITIN) 10 MG tablet Take 10 mg by mouth daily as needed. For allergies.      . Multiple Vitamin (MULTIVITAMIN WITH MINERALS) TABS Take 1 tablet by mouth every morning.      Marland Kitchen omeprazole (PRILOSEC) 20 MG capsule Take 20 mg by mouth every morning.      . pravastatin (PRAVACHOL) 20 MG tablet Take 20 mg by mouth every morning.       No current facility-administered medications for this visit.       Review of Systems     Objective:   Physical Exam  Vitals reviewed. Constitutional: He is oriented to person, place, and time. He  appears well-nourished. No distress.  HENT:  Head: Normocephalic and atraumatic.  Mouth/Throat: Oropharynx is clear and moist. No oropharyngeal exudate.  Eyes: Pupils are equal, round, and reactive to light. No scleral icterus.  Neck: Normal range of motion. Neck supple.  Cardiovascular: Normal rate, regular rhythm and normal heart sounds.   Pulmonary/Chest: Effort normal and breath sounds normal. No respiratory distress.  Abdominal: Bowel sounds are normal. He exhibits no distension.  Musculoskeletal: He exhibits no edema.  Lymphadenopathy:    He has no cervical adenopathy.  Neurological: He is alert and oriented to person, place, and time.  NO FOCAL DEFICITS   Psychiatric: He has a normal mood and affect.          Assessment & Plan:

## 2013-01-23 NOTE — Patient Instructions (Signed)
THE RECTAL LESION WAS NOT COMPLETELY REMOVED.  I WILL CALL YOUR DAUGHTER AFTER I SPEAK WITH DRS. OUTLAW AND NEIJSTROM.  FOLLOW A HIGH FIBER DIET. SEE INFO BELOW.  CONTINUE PRILOSEC EVERY MORNING.  FOLLOW UP IN 6 MOS.  High-Fiber Diet A high-fiber diet changes your normal diet to include more whole grains, legumes, fruits, and vegetables. Changes in the diet involve replacing refined carbohydrates with unrefined foods. The calorie level of the diet is essentially unchanged. The Dietary Reference Intake (recommended amount) for adult males is 38 grams per day. For adult females, it is 25 grams per day. Pregnant and lactating women should consume 28 grams of fiber per day. Fiber is the intact part of a plant that is not broken down during digestion. Functional fiber is fiber that has been isolated from the plant to provide a beneficial effect in the body. PURPOSE  Increase stool bulk.   Ease and regulate bowel movements.   Lower cholesterol.  INDICATIONS THAT YOU NEED MORE FIBER  Constipation and hemorrhoids.   Uncomplicated diverticulosis (intestine condition) and irritable bowel syndrome.   Weight management.   As a protective measure against hardening of the arteries (atherosclerosis), diabetes, and cancer.   GUIDELINES FOR INCREASING FIBER IN THE DIET  Start adding fiber to the diet slowly. A gradual increase of about 5 more grams (2 slices of whole-wheat bread, 2 servings of most fruits or vegetables, or 1 bowl of high-fiber cereal) per day is best. Too rapid an increase in fiber may result in constipation, flatulence, and bloating.   Drink enough water and fluids to keep your urine clear or pale yellow. Water, juice, or caffeine-free drinks are recommended. Not drinking enough fluid may cause constipation.   Eat a variety of high-fiber foods rather than one type of fiber.   Try to increase your intake of fiber through using high-fiber foods rather than fiber pills or  supplements that contain small amounts of fiber.   The goal is to change the types of food eaten. Do not supplement your present diet with high-fiber foods, but replace foods in your present diet.  INCLUDE A VARIETY OF FIBER SOURCES  Replace refined and processed grains with whole grains, canned fruits with fresh fruits, and incorporate other fiber sources. White rice, white breads, and most bakery goods contain little or no fiber.   Brown whole-grain rice, buckwheat oats, and many fruits and vegetables are all good sources of fiber. These include: broccoli, Brussels sprouts, cabbage, cauliflower, beets, sweet potatoes, white potatoes (skin on), carrots, tomatoes, eggplant, squash, berries, fresh fruits, and dried fruits.   Cereals appear to be the richest source of fiber. Cereal fiber is found in whole grains and bran. Bran is the fiber-rich outer coat of cereal grain, which is largely removed in refining. In whole-grain cereals, the bran remains. In breakfast cereals, the largest amount of fiber is found in those with "bran" in their names. The fiber content is sometimes indicated on the label.   You may need to include additional fruits and vegetables each day.   In baking, for 1 cup white flour, you may use the following substitutions:   1 cup whole-wheat flour minus 2 tablespoons.   1/2 cup white flour plus 1/2 cup whole-wheat flour.

## 2013-01-23 NOTE — Progress Notes (Signed)
Faxed to PCP, Dr Mariel Sleet, Dr Dulce Sellar

## 2013-01-23 NOTE — Assessment & Plan Note (Addendum)
   WILL DISCUSS FUTURE MANAGEMENT AND NEED FOR COMPLETE EXCSIONS WITH DRs: NEIJSTROM/OUTLAW. REVIEWED MANAGEMENT FROM 2010 TO PRESENT. PT UNDERSTAND THE MARGINS ARE NOT CLEAR. WILL CALL DAUGHTER AFTER I SPEAK WITH DRS, OUTLAW AND NEIJSTROM.   OPV IN 6 MOS. CBC IN 6 MOS.

## 2013-01-23 NOTE — Assessment & Plan Note (Signed)
NEXT SCREENING TCS 2023 HIGH FIBER DIET

## 2013-01-23 NOTE — Assessment & Plan Note (Signed)
DUE TO GASTRITIS,POLYPS, CARCINOID OF THW RECTUM. HB IMPROVED TO 13.8.  CONTINUE OMP. REFILL x1 YEAR.

## 2013-02-07 NOTE — Progress Notes (Signed)
Reminders in epic °

## 2013-06-11 ENCOUNTER — Other Ambulatory Visit (HOSPITAL_COMMUNITY): Payer: Self-pay

## 2013-06-18 ENCOUNTER — Other Ambulatory Visit (HOSPITAL_COMMUNITY): Payer: Self-pay

## 2013-06-25 ENCOUNTER — Ambulatory Visit (HOSPITAL_COMMUNITY): Payer: Self-pay | Admitting: Oncology

## 2013-07-01 ENCOUNTER — Encounter (HOSPITAL_COMMUNITY): Payer: Self-pay

## 2013-07-03 ENCOUNTER — Telehealth: Payer: Self-pay | Admitting: Gastroenterology

## 2013-07-03 ENCOUNTER — Encounter: Payer: Self-pay | Admitting: Gastroenterology

## 2013-07-03 NOTE — Telephone Encounter (Signed)
Pt is on Sept recall for OV with SF and to have CBC

## 2013-07-04 ENCOUNTER — Encounter (HOSPITAL_COMMUNITY): Payer: Self-pay | Attending: Internal Medicine

## 2013-07-04 DIAGNOSIS — C7A026 Malignant carcinoid tumor of the rectum: Secondary | ICD-10-CM

## 2013-07-04 DIAGNOSIS — D3A00 Benign carcinoid tumor of unspecified site: Secondary | ICD-10-CM

## 2013-07-04 DIAGNOSIS — Z09 Encounter for follow-up examination after completed treatment for conditions other than malignant neoplasm: Secondary | ICD-10-CM | POA: Insufficient documentation

## 2013-07-04 LAB — CBC
HCT: 40.8 % (ref 39.0–52.0)
MCH: 29.5 pg (ref 26.0–34.0)
MCHC: 33.3 g/dL (ref 30.0–36.0)
MCV: 88.5 fL (ref 78.0–100.0)
Platelets: 181 10*3/uL (ref 150–400)
RDW: 12.6 % (ref 11.5–15.5)

## 2013-07-04 LAB — COMPREHENSIVE METABOLIC PANEL
ALT: 33 U/L (ref 0–53)
AST: 31 U/L (ref 0–37)
Albumin: 4.1 g/dL (ref 3.5–5.2)
Alkaline Phosphatase: 95 U/L (ref 39–117)
BUN: 11 mg/dL (ref 6–23)
Chloride: 99 mEq/L (ref 96–112)
Potassium: 3.7 mEq/L (ref 3.5–5.1)
Sodium: 136 mEq/L (ref 135–145)
Total Bilirubin: 0.7 mg/dL (ref 0.3–1.2)
Total Protein: 7.6 g/dL (ref 6.0–8.3)

## 2013-07-04 NOTE — Telephone Encounter (Addendum)
PLEASE CALL PT. We will wait to see his results from the blood he had drawn today.

## 2013-07-04 NOTE — Progress Notes (Signed)
Tim Cunningham presented for labwork. Labs per MD order drawn via Peripheral Line 23 gauge needle inserted in right antecubital.  Good blood return present. Procedure without incident.  Needle removed intact. Patient tolerated procedure well.

## 2013-07-04 NOTE — Telephone Encounter (Signed)
Pt just had a CBC drawn today from oncologist.

## 2013-07-04 NOTE — Telephone Encounter (Signed)
Called and LMOM that we will review CBC from oncologist.

## 2013-07-07 LAB — SEROTONIN SERUM: Serotonin, Serum: 150 ng/mL (ref 56–244)

## 2013-07-16 ENCOUNTER — Encounter (HOSPITAL_BASED_OUTPATIENT_CLINIC_OR_DEPARTMENT_OTHER): Payer: Self-pay | Admitting: Oncology

## 2013-07-16 ENCOUNTER — Encounter (HOSPITAL_COMMUNITY): Payer: Self-pay | Admitting: Oncology

## 2013-07-16 VITALS — BP 149/66 | HR 58 | Temp 97.5°F | Resp 14 | Wt 196.0 lb

## 2013-07-16 DIAGNOSIS — D3A026 Benign carcinoid tumor of the rectum: Secondary | ICD-10-CM

## 2013-07-16 DIAGNOSIS — C7A026 Malignant carcinoid tumor of the rectum: Secondary | ICD-10-CM

## 2013-07-16 NOTE — Patient Instructions (Addendum)
[facility] Discharge Instructions  RECOMMENDATIONS MADE BY THE CONSULTANT AND ANY TEST RESULTS WILL BE SENT TO YOUR REFERRING PHYSICIAN.  EXAM FINDINGS BY THE PHYSICIAN TODAY AND SIGNS OR SYMPTOMS TO REPORT TO CLINIC OR PRIMARY PHYSICIAN: Exam and discussion by Dellis Anes, [name].  Will need to get you scheduled for CT Scan of your abdomen and pelvis.  MEDICATIONS PRESCRIBED:  none  INSTRUCTIONS GIVEN AND DISCUSSED: Report increased bowel movements, pain or other problems.  SPECIAL INSTRUCTIONS/FOLLOW-UP: CT of Abdomen and Pelvis in next few weeks and in 6months blood work, 24 hour Urine collection and follow-up.   Thank you for choosing [facility] to provide your oncology and hematology care.  To afford each patient quality time with our providers, please arrive at least 15 minutes before your scheduled appointment time.  With your help, our goal is to use those 15 minutes to complete the necessary work-up to ensure our physicians have the information they need to help with your evaluation and healthcare recommendations.    Effective January 1st, 2014, we ask that you re-schedule your appointment with our physicians should you arrive 10 or more minutes late for your appointment.  We strive to give you quality time with our providers, and arriving late affects you and other patients whose appointments are after yours.    Again, thank you for choosing [facility].  Our hope is that these requests will decrease the amount of time that you wait before being seen by our physicians.       _____________________________________________________________  Should you have questions after your visit to [facility], please contact our office at [phone] between the hours of 8:30 a.m. and 5:00 p.m.  Voicemails left after 4:30 p.m. will not be returned until the following business day.  For prescription refill requests, have your pharmacy  contact our office with your prescription refill request.

## 2013-07-16 NOTE — Progress Notes (Signed)
MUSE,[name], [name] [address] [place] [address]  Carcinoid tumor of rectum - Plan: CBC with Differential, Comprehensive metabolic panel, Serotonin serum, 5 HIAA, quantitative, urine, 24 hour, Chromogranin A, CT Abdomen Pelvis W Contrast  CURRENT THERAPY: Surveillance  INTERVAL HISTORY: Tim Cunningham 62 y.o. 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 Dulce Sellar however with tumor nests seen at the resection border. He presented to the health Department and was found to have blood in his stool after a routine evaluation. He was then referred to Dr. Darrick Penna who did EGD and colonoscopy. EGD revealed benign findings with mild gastritis/duodenitis. Colonoscopy however revealed 2 polyps one in the sigmoid colon one of the rectum that were removed. The rectal lesion revealed carcinoid. He was then referred to Dr. Dulce Sellar on 12/11/2012 try to remove the base of the lesion. The above findings were found. He remains asymptomatic presently and was asymptomatic before the above procedure.    According to Dr. Mariel Sleet on his noted dated 01/03/2013: "After discussing his case with Dr. Dulce Sellar, we both feel that he can be followed with periodic sigmoidoscopies/colonoscopies for this carcinoid process. We both agree that he does not need definitive surgical intervention other than when he has had done already. It is of note that his serum seroton and urine for 5 HIAA as well as in the normal range.  We may never need to definitively operated on this gentleman potentially."  I personally reviewed and went over laboratory results with the patient.  His lab work is solid and WNL.    I personally reviewed and went over radiographic studies with the patient.  His last CT scan was in Dec 2013.    The patient denies any complaints and ROS questioning is negative.  He is accompanied with his daughter.  I provided them education regarding lab results,  future surveillance plan, and education regarding carcinoid tumor.  Surveillance protocol:   CT abd/pelvis with contrast scan every 6 months x 2-3 years and then annually or clinically indicated. Labs every 6 months: CBC diff, CMET, Serum serotonin, Chromogranin A, and 24 hour urine collection for 5HIAA Sigmoidoscopy or other visualization technique by GI specialist, Dr. Darrick Penna, at her discretion.   Past Medical History  Diagnosis Date  . Hyperlipidemia   . GERD (gastroesophageal reflux disease)   . 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     has No Known Allergies.  Tim Cunningham does not currently have medications on file.  Past Surgical History  Procedure Laterality Date  . Tonsillectomy    . Colonoscopy  11/01/2012    UEA:VWUJW internal hemorrhoids/Moderate diverticulosis/Small lipoma WITH PILLOW LIKE TEXTURE at the hepatic flexure/Two sessile polyps ranging between 3-39mm   . Esophagogastroduodenoscopy  11/01/2012    JXB:JYNWGNFA ring was found/MILD GASTRITIS/DUODENIIS  . Eus  12/11/2012    RECTAL CARCINOID EXCISED-MARGINS NOT CLEAR    Denies any headaches, dizziness, double vision, fevers, chills, night sweats, nausea, vomiting, diarrhea, constipation, chest pain, heart palpitations, shortness of breath, blood in stool, black tarry stool, urinary pain, urinary burning, urinary frequency, hematuria.   PHYSICAL EXAMINATION  ECOG PERFORMANCE STATUS: 0 - Asymptomatic  Filed Vitals:   07/16/13 0921  BP: 149/66  Pulse: 58  Temp: 97.5 F (36.4 C)  Resp: 14  GENERAL:alert, no distress, well nourished, well developed, comfortable, cooperative, obese and smiling SKIN: skin color, texture, turgor are normal, no rashes or significant lesions HEAD: Normocephalic, No masses, lesions, tenderness or abnormalities EYES: normal, PERRLA,  EOMI, Conjunctiva are pink and non-injected EARS: External ears normal OROPHARYNX:mucous membranes are moist  NECK: supple, no adenopathy, thyroid normal size, non-tender, without nodularity, no stridor, non-tender, trachea midline LYMPH:  no palpable lymphadenopathy, no hepatosplenomegaly BREAST:not examined LUNGS: clear to auscultation and percussion HEART: regular rate & rhythm, no murmurs, no gallops, S1 normal and S2 normal ABDOMEN:abdomen soft, non-tender, normal bowel sounds, no masses or organomegaly and no hepatosplenomegaly, small umbilical hernia with defect measuring 1 cm or less. BACK: Back symmetric, no curvature., No CVA tenderness EXTREMITIES:less then 2 second capillary refill, no joint deformities, effusion, or inflammation, no edema, no skin discoloration, no clubbing, no cyanosis  NEURO: alert & oriented x 3 with fluent speech, no focal motor/sensory deficits, gait normal    LABORATORY DATA: CBC    Component Value Date/Time   WBC 6.3 07/04/2013 0850   RBC 4.61 07/04/2013 0850   HGB 13.6 07/04/2013 0850   HCT 40.8 07/04/2013 0850   HCT 41 09/02/2012 1056   PLT 181 07/04/2013 0850   MCV 88.5 07/04/2013 0850   MCV 86.4 09/02/2012 1056   MCH 29.5 07/04/2013 0850   MCHC 33.3 07/04/2013 0850   RDW 12.6 07/04/2013 0850   LYMPHSABS 1.9 12/27/2012 1114   MONOABS 0.7 12/27/2012 1114   EOSABS 0.2 12/27/2012 1114   BASOSABS 0.0 12/27/2012 1114      Chemistry      Component Value Date/Time   NA 136 07/04/2013 0850   NA 140 09/02/2012 1058   K 3.7 07/04/2013 0850   K 4.2 09/02/2012 1058   CL 99 07/04/2013 0850   CL 105 09/02/2012 1058   CO2 27 07/04/2013 0850   BUN 11 07/04/2013 0850   BUN 10 09/02/2012 1058   CREATININE 0.82 07/04/2013 0850   CREATININE 0.79 09/02/2012 1058   GLU 85 09/02/2012 1058      Component Value Date/Time   CALCIUM 9.5 07/04/2013 0850   CALCIUM 9.6 09/02/2012 1058   ALKPHOS 95 07/04/2013 0850   ALKPHOS 76 09/02/2012 1058   AST 31 07/04/2013 0850   AST 24  09/02/2012 1058   ALT 33 07/04/2013 0850   BILITOT 0.7 07/04/2013 0850   BILITOT 0.6 09/02/2012 1058     Results for Tim Cunningham, Tim Cunningham (MRN [id number]) as of 07/16/2013 09:23  Ref. Range 07/04/2013 08:50  Serotonin, Serum Latest Range: 56-244 ng/mL 150   Results for Tim Cunningham, Tim Cunningham (MRN [id number]) as of 07/16/2013 09:23  Ref. Range 07/04/2013 08:50  5-HIAA, 24 Hr Urine Latest Range: <=6.0 mg/24 h 3.7     PATHOLOGY:  12/11/2012  Diagnosis Rectum, polyp(s) - LOW GRADE, WELL DIFFERENTIATED NEUROENDOCRINE TUMOR (CARCINOID) (0.8 CM). - TUMOR FOCALLY EXTENDS TO CAUTERIZED TISSUE EDGE/POLYPECTOMY MARGIN. - SEE COMMENT. Microscopic Comment The morphology is diagnostic of carcinoid tumor. There are no atypical findings identified. Although the tumor is almost entirely excised, there are a few nests of tumor that are focally present at the cauterized tissue edge/polypectomy margin. (CR:kh 12-12-12) [place] RUND DO Pathologist, Electronic Signature (Case signed 12/12/2012)    ASSESSMENT:  1. 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 Dulce Sellar however with tumor nests seen at the resection border. He presented to the health Department and was found to have blood in his stool after a routine evaluation. He was  then referred to Dr. Darrick Penna who did EGD and colonoscopy. EGD revealed benign findings with mild gastritis/duodenitis. Colonoscopy however revealed 2 polyps one in the sigmoid colon one of the rectum that were removed. The rectal lesion revealed carcinoid. He was then referred to Dr. Dulce Sellar on 12/11/2012 try to remove the base of the lesion. The above findings were found. He remains asymptomatic presently and was asymptomatic before the above procedure. 2. Small umbilical hernia, asymptomatic.  Patient Active Problem List   Diagnosis Date Noted  . Colon cancer screening 01/23/2013  . Carcinoid tumor of rectum 12/25/2012  . Umbilical hernia without obstruction or gangrene 10/15/2012  .  Heme positive stool 10/15/2012    PLAN:  1. I personally reviewed and went over laboratory results with the patient. 2. I personally reviewed and went over radiographic studies with the patient. 3. Labs in 6 months: CBC diff, CMET, Serum serotonin, Chromogranin A, and 24 hour urine collection for 5-HIAA 4. CT scan of abd/pelvis with contrast in 1-2 months for surveillance. 5. Defer frequency of visualization of rectum/colon to GI, Dr. Darrick Penna.  6. Return in 6 months for follow-up   THERAPY PLAN:  Since there is a lack of evidence for a solid surveillance program with those with Carcinoid Tumor, will loosely base our protocol with this patient on NCCN guidelines.  Will perform CT of abd/pelvis every 6 months x 2-3 years and then annually or clinically indicated.  Will perform labs every 6 months as mentioned above.  Will defer frequency of sigmoidoscopy/colonoscopy to GI specialist, Dr. Darrick Penna.  We will see him bi-annually, unless there are issues moving forward.   All questions were answered. The patient knows to call the clinic with any problems, questions or concerns. We can certainly see the patient much sooner if necessary.  Patient and plan discussed with Dr. Erline Hau and he is in agreement with the aforementioned.   KEFALAS,THOMAS

## 2013-08-11 ENCOUNTER — Ambulatory Visit (HOSPITAL_COMMUNITY): Payer: Self-pay

## 2013-08-26 ENCOUNTER — Ambulatory Visit (HOSPITAL_COMMUNITY)
Admission: RE | Admit: 2013-08-26 | Discharge: 2013-08-26 | Disposition: A | Discharge status: Home / Self Care | Payer: Self-pay | Source: Ambulatory Visit | Attending: Oncology | Admitting: Oncology

## 2013-08-26 DIAGNOSIS — K5730 Diverticulosis of large intestine without perforation or abscess without bleeding: Secondary | ICD-10-CM | POA: Insufficient documentation

## 2013-08-26 DIAGNOSIS — K429 Umbilical hernia without obstruction or gangrene: Secondary | ICD-10-CM | POA: Insufficient documentation

## 2013-08-26 DIAGNOSIS — D3A026 Benign carcinoid tumor of the rectum: Secondary | ICD-10-CM | POA: Insufficient documentation

## 2013-08-26 MED ORDER — IOHEXOL 300 MG/ML  SOLN
100.0000 mL | Freq: Once | INTRAMUSCULAR | Status: AC | PRN
  Administered 2013-08-26: 100 mL via INTRAVENOUS

## 2013-09-25 ENCOUNTER — Other Ambulatory Visit: Payer: Self-pay

## 2014-01-16 ENCOUNTER — Encounter (HOSPITAL_COMMUNITY): Payer: Self-pay | Attending: Hematology and Oncology

## 2014-01-16 ENCOUNTER — Other Ambulatory Visit (HOSPITAL_COMMUNITY): Payer: Self-pay | Admitting: Oncology

## 2014-01-16 DIAGNOSIS — D3A026 Benign carcinoid tumor of the rectum: Secondary | ICD-10-CM | POA: Insufficient documentation

## 2014-01-16 DIAGNOSIS — E876 Hypokalemia: Secondary | ICD-10-CM

## 2014-01-16 DIAGNOSIS — C7A026 Malignant carcinoid tumor of the rectum: Secondary | ICD-10-CM

## 2014-01-16 LAB — COMPREHENSIVE METABOLIC PANEL
ALT: 36 U/L (ref 0–53)
AST: 31 U/L (ref 0–37)
Albumin: 4.1 g/dL (ref 3.5–5.2)
Alkaline Phosphatase: 91 U/L (ref 39–117)
BUN: 10 mg/dL (ref 6–23)
CALCIUM: 9.4 mg/dL (ref 8.4–10.5)
CO2: 29 meq/L (ref 19–32)
CREATININE: 0.96 mg/dL (ref 0.50–1.35)
Chloride: 102 mEq/L (ref 96–112)
GFR, EST NON AFRICAN AMERICAN: 86 mL/min — AB (ref >90)
GLUCOSE: 104 mg/dL — AB (ref 70–99)
Potassium: 3.6 mEq/L — ABNORMAL LOW (ref 3.7–5.3)
SODIUM: 141 meq/L (ref 137–147)
TOTAL PROTEIN: 7.8 g/dL (ref 6.0–8.3)
Total Bilirubin: 0.6 mg/dL (ref 0.3–1.2)

## 2014-01-16 LAB — CBC WITH DIFFERENTIAL/PLATELET
BASOS ABS: 0 10*3/uL (ref 0.0–0.1)
Basophils Relative: 1 % (ref 0–1)
EOS ABS: 0.3 10*3/uL (ref 0.0–0.7)
EOS PCT: 5 % (ref 0–5)
HEMATOCRIT: 41.8 % (ref 39.0–52.0)
Hemoglobin: 14.1 g/dL (ref 13.0–17.0)
LYMPHS ABS: 1.6 10*3/uL (ref 0.7–4.0)
LYMPHS PCT: 29 % (ref 12–46)
MCH: 29.8 pg (ref 26.0–34.0)
MCHC: 33.7 g/dL (ref 30.0–36.0)
MCV: 88.4 fL (ref 78.0–100.0)
MONO ABS: 0.4 10*3/uL (ref 0.1–1.0)
Monocytes Relative: 8 % (ref 3–12)
Neutro Abs: 3.2 10*3/uL (ref 1.7–7.7)
Neutrophils Relative %: 58 % (ref 43–77)
PLATELETS: 181 10*3/uL (ref 150–400)
RBC: 4.73 MIL/uL (ref 4.22–5.81)
RDW: 12.4 % (ref 11.5–15.5)
WBC: 5.5 10*3/uL (ref 4.0–10.5)

## 2014-01-16 MED ORDER — POTASSIUM CHLORIDE ER 10 MEQ PO TBCR: 10.0000 meq | EXTENDED_RELEASE_TABLET | Freq: Two times a day (BID) | ORAL | Status: DC

## 2014-01-16 NOTE — Progress Notes (Signed)
Labs drawn today for cbc/diff,cmp,serotonin,chromogranin A

## 2014-01-18 LAB — SEROTONIN SERUM: Serotonin, Serum: 93 ng/mL (ref 56–244)

## 2014-01-22 LAB — CHROMOGRANIN A: Chromogranin A: 6.4 ng/mL (ref 1.9–15.0)

## 2014-01-26 ENCOUNTER — Encounter (HOSPITAL_COMMUNITY): Payer: Self-pay | Attending: Hematology and Oncology

## 2014-01-26 ENCOUNTER — Encounter (HOSPITAL_COMMUNITY): Payer: Self-pay

## 2014-01-26 VITALS — BP 172/84 | HR 57 | Temp 97.6°F | Resp 18 | Wt 193.5 lb

## 2014-01-26 DIAGNOSIS — D3A026 Benign carcinoid tumor of the rectum: Secondary | ICD-10-CM | POA: Insufficient documentation

## 2014-01-26 DIAGNOSIS — K429 Umbilical hernia without obstruction or gangrene: Secondary | ICD-10-CM

## 2014-01-26 NOTE — Addendum Note (Signed)
Addended by: Joie Bimler on: 01/26/2014 10:21 AM   Modules accepted: Orders

## 2014-01-26 NOTE — Progress Notes (Signed)
Tim Cunningham  OFFICE PROGRESS NOTE  Tim Cunningham,Tim D., [name] [address] [place]  [address]  DIAGNOSIS: No diagnosis found.  Chief Complaint  Patient presents with  . Rectal carcinoid, positive margin on resection, no postop tr    No postop treatment.    CURRENT THERAPY: Watchful expectation.  INTERVAL HISTORY: Tim Cunningham 63 y.o. male returns for followup of rectal carcinoma, status post incomplete resection transanally in February of 2014 been followed conservatively. Last CAT scan on 33/12/9516 showed an umbilical hernia with possible gallstones. GI followup was recommended at the time of his last visit in August 2014.  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 Appetite is good with no nausea, vomiting, diarrhea, constipation, melena, hematochezia, hematuria, fever, night sweats, skin rash, lower extremity swelling or redness, chest pain, PND, orthopnea, headache, or seizures.  MEDICAL HISTORY: Past Medical History  Diagnosis Date  . Hyperlipidemia   . GERD (gastroesophageal reflux disease)   . 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  Colonoscopy  revealed 2 polyps one in the sigmoid colon one of the rectum that were removed. The rectal lesion revealed carcinoid. He was then referred to Dr. Paulita Fujita on 12/11/2012 try to remove the base of the lesion. 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 Paulita Fujita however with tumor nests seen at the resection border.  According to Dr. Tressie Stalker on his noted dated 01/03/2013: "After discussing his case with Dr. Paulita Fujita, we both feel that he can be followed with periodic sigmoidoscopies/colonoscopies for this carcinoid process. We both agree that he does not need definitive surgical intervention other than when  he has had done already. It is of note that his serum seroton and urine for 5 HIAA as well as in the normal range. We may never need to definitively operated on this gentleman potentially Surveillance protocol:  CT abd/pelvis with contrast scan every 6 months x 2-3 years and then annually or clinically indicated.  Labs every 6 months: CBC diff, CMET, Serum serotonin, Chromogranin A, and 24 hour urine collection for 5HIAA  Sigmoidoscopy or other visualization technique by GI specialist, Dr. Oneida Alar, at her discretion.     ALLERGIES:  has No Known Allergies.  MEDICATIONS: has a current medication list which includes the following prescription(s): aspirin ec, azelastine, loratadine, multivitamin with minerals, omeprazole, potassium chloride, and pravastatin.  SURGICAL HISTORY:  Past Surgical History  Procedure Laterality Date  . Tonsillectomy    . Colonoscopy  11/01/2012    ACZ:YSAYT internal hemorrhoids/Moderate diverticulosis/Small lipoma WITH PILLOW LIKE TEXTURE at the hepatic flexure/Two sessile polyps ranging between 3-38m   . Esophagogastroduodenoscopy  11/01/2012    SKZS:WFUXNATFring was found/MILD GASTRITIS/DUODENIIS  . Eus  12/11/2012    RECTAL CARCINOID EXCISED-MARGINS NOT CLEAR    FAMILY HISTORY: family history includes Cancer in his maternal uncle and mother; Colon cancer in his other; Lung cancer in his other.  SOCIAL HISTORY:  reports that he quit smoking about 32 years ago. His smoking use included Cigarettes. He has a 5 pack-year smoking history. He has never used smokeless tobacco. He reports that he drinks alcohol. He reports that he does not use illicit drugs.  REVIEW OF SYSTEMS:  Other than that discussed above is noncontributory.  PHYSICAL EXAMINATION: ECOG PERFORMANCE STATUS: 0 -  Asymptomatic  There were no vitals taken for this visit.  GENERAL:alert, no distress and comfortable SKIN: skin color, texture, turgor are normal, no rashes or significant lesions EYES:  PERLA; Conjunctiva are pink and non-injected, sclera clear OROPHARYNX:no exudate, no erythema on lips, buccal mucosa, or tongue. NECK: supple, thyroid normal size, non-tender, without nodularity. No masses CHEST:  Normal AP diameter with no breast masses. LYMPH:  no palpable lymphadenopathy in the cervical, axillary or inguinal LUNGS: clear to auscultation and percussion with normal breathing effort HEART: regular rate & rhythm and no murmurs. No diastolic murmur. ABDOMEN:abdomen soft, non-tender and normal bowel sounds MUSCULOSKELETAL:no cyanosis of digits and no clubbing. Range of motion normal.  NEURO: alert & oriented x 3 with fluent speech, no focal motor/sensory deficits   LABORATORY DATA: Infusion on 01/16/2014  Component Date Value Ref Range Status  . WBC 01/16/2014 5.5  4.0 - 10.5 K/uL Final  . RBC 01/16/2014 4.73  4.22 - 5.81 MIL/uL Final  . Hemoglobin 01/16/2014 14.1  13.0 - 17.0 g/dL Final  . HCT 01/16/2014 41.8  39.0 - 52.0 % Final  . MCV 01/16/2014 88.4  78.0 - 100.0 fL Final  . MCH 01/16/2014 29.8  26.0 - 34.0 pg Final  . MCHC 01/16/2014 33.7  30.0 - 36.0 g/dL Final  . RDW 01/16/2014 12.4  11.5 - 15.5 % Final  . Platelets 01/16/2014 181  150 - 400 K/uL Final  . Neutrophils Relative % 01/16/2014 58  43 - 77 % Final  . Neutro Abs 01/16/2014 3.2  1.7 - 7.7 K/uL Final  . Lymphocytes Relative 01/16/2014 29  12 - 46 % Final  . Lymphs Abs 01/16/2014 1.6  0.7 - 4.0 K/uL Final  . Monocytes Relative 01/16/2014 8  3 - 12 % Final  . Monocytes Absolute 01/16/2014 0.4  0.1 - 1.0 K/uL Final  . Eosinophils Relative 01/16/2014 5  0 - 5 % Final  . Eosinophils Absolute 01/16/2014 0.3  0.0 - 0.7 K/uL Final  . Basophils Relative 01/16/2014 1  0 - 1 % Final  . Basophils Absolute 01/16/2014 0.0  0.0 - 0.1 K/uL Final  . Sodium 01/16/2014 141  137 - 147 mEq/L Final  . Potassium 01/16/2014 3.6* 3.7 - 5.3 mEq/L Final  . Chloride 01/16/2014 102  96 - 112 mEq/L Final  . CO2 01/16/2014 29  19 -  32 mEq/L Final  . Glucose, Bld 01/16/2014 104* 70 - 99 mg/dL Final  . BUN 01/16/2014 10  6 - 23 mg/dL Final  . Creatinine, Ser 01/16/2014 0.96  0.50 - 1.35 mg/dL Final  . Calcium 01/16/2014 9.4  8.4 - 10.5 mg/dL Final  . Total Protein 01/16/2014 7.8  6.0 - 8.3 g/dL Final  . Albumin 01/16/2014 4.1  3.5 - 5.2 g/dL Final  . AST 01/16/2014 31  0 - 37 U/L Final  . ALT 01/16/2014 36  0 - 53 U/L Final  . Alkaline Phosphatase 01/16/2014 91  39 - 117 U/L Final  . Total Bilirubin 01/16/2014 0.6  0.3 - 1.2 mg/dL Final  . GFR calc non Af Amer 01/16/2014 86* >90 mL/min Final  . GFR calc Af Amer 01/16/2014 >90  >90 mL/min Final   Comment: (NOTE)                          The eGFR has been calculated using the CKD EPI equation.  This calculation has not been validated in all clinical situations.                          eGFR's persistently <90 mL/min signify possible Chronic Kidney                          Disease.  . Serotonin, Serum 01/16/2014 93  56 - 244 ng/mL Final   Performed at [facility]  . Chromogranin A 01/16/2014 6.4  1.9 - 15.0 ng/mL Final   Comment: (NOTE)                          This test was performed using a laboratory developed                          electrochemiluminescent method. Values obtained with different assay                          methods cannot be used interchangeably. Chromogranin A levels,                          regardless of value, should not be interpreted as absolute evidence of                          the presence or absence of disease.                          This test was developed and its performance characteristics have been                          determined by [employer], [place]. Performance characteristics refer to the analytical                          performance of the test.                          Performed at [facility]: for SHEP, PORTER ([id number]) Patient Name: BALDOMERO, MIRARCHI Accession #: [id number] DOB: 11-30-1950 Age: 62 Gender: M Client Name [facility] Collected Date: 12/11/2012 Received Date: 12/11/2012 Physician:William M. Paulita Fujita, MD Chart #: MRN # : [id number] Physician cc: Race: B Visit #: [id number] REPORT OF SURGICAL PATHOLOGY FINAL DIAGNOSIS Diagnosis Rectum, polyp(s) - LOW GRADE, WELL DIFFERENTIATED NEUROENDOCRINE TUMOR (CARCINOID) (0.8 CM). - TUMOR FOCALLY EXTENDS TO CAUTERIZED TISSUE EDGE/POLYPECTOMY MARGIN. - SEE COMMENT. Microscopic Comment The morphology is diagnostic of carcinoid tumor. There are no atypical findings identified. Although the tumor is almost entirely excised, there are a few nests of tumor that are focally present at the cauterized tissue edge/polypectomy margin. (CR:kh 12-12-12) [place] RUND DO Pathologist, Electronic Signature (Case signed 12/12/2012) Specimen Gross and Clinical Information Specimen(s) Obtained: Rectum, polyp(s) Specimen Clinical Information polyp/rectal carcinoid (cm) Gross Received in formalin is a 0.8 x 0.7 x 0.4 cm rubbery, tan red mucosal polyp. The base is inked and the polyp  is sectioned and entirely submitted in one cassette. (GP:eps 12/11/12) Report signed out from the following location(s) [address] [place], [place], [place] [address]. CLIA #: [id number], [place], [place], [place] [address]. CLIA #:  Urinalysis No results found for this basename: colorurine, appearanceur, labspec, phurine, glucoseu, hgbur, bilirubinur, ketonesur, proteinur, urobilinogen, nitrite, leukocytesur    RADIOGRAPHIC STUDIES: No results found.  ASSESSMENT:  1. 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 Paulita Fujita however with tumor nests seen at the resection border, no evidence of disease with plans to see Dr. Paulita Fujita within the next month for repeat  anoscopy. 2. Small umbilical hernia, asymptomatic   PLAN:  #1. Followup in 6 months with CBC, chem profile, chromogranin A level, and serotonin level.   All questions were answered. The patient knows to call the clinic with any problems, questions or concerns. We can certainly see the patient much sooner if necessary.   I spent 25 minutes counseling the patient face to face. The total time spent in the appointment was 30 minutes.    Doroteo Bradford, MD 01/26/2014 9:18 AM

## 2014-01-26 NOTE — Patient Instructions (Signed)
[facility] Discharge Instructions  RECOMMENDATIONS MADE BY THE CONSULTANT AND ANY TEST RESULTS WILL BE SENT TO YOUR REFERRING PHYSICIAN.  EXAM FINDINGS BY THE PHYSICIAN TODAY AND SIGNS OR SYMPTOMS TO REPORT TO CLINIC OR PRIMARY PHYSICIAN: Exam and findings as discussed by Dr. Barnet Glasgow.  MEDICATIONS PRESCRIBED:  None  INSTRUCTIONS/FOLLOW-UP:  Return in 6 months for a follow-up visit with the doctor and blood work.  Thank you for choosing [facility] to provide your oncology and hematology care.  To afford each patient quality time with our providers, please arrive at least 15 minutes before your scheduled appointment time.  With your help, our goal is to use those 15 minutes to complete the necessary work-up to ensure our physicians have the information they need to help with your evaluation and healthcare recommendations.    Effective January 1st, 2014, we ask that you re-schedule your appointment with our physicians should you arrive 10 or more minutes late for your appointment.  We strive to give you quality time with our providers, and arriving late affects you and other patients whose appointments are after yours.    Again, thank you for choosing [facility].  Our hope is that these requests will decrease the amount of time that you wait before being seen by our physicians.       _____________________________________________________________  Should you have questions after your visit to [facility], please contact our office at [phone] between the hours of 8:30 a.m. and 5:00 p.m.  Voicemails left after 4:30 p.m. will not be returned until the following business day.  For prescription refill requests, have your pharmacy contact our office with your prescription refill request.

## 2014-02-10 NOTE — Telephone Encounter (Signed)
Reminder in epic °

## 2014-02-10 NOTE — Telephone Encounter (Addendum)
CONTACT PT TO [facility] E30 RECTAL CARCINOID IN OCT 2015.

## 2014-02-16 ENCOUNTER — Other Ambulatory Visit: Payer: Self-pay

## 2014-02-16 MED ORDER — OMEPRAZOLE 20 MG PO CPDR: 20.0000 mg | DELAYED_RELEASE_CAPSULE | Freq: Every morning | ORAL | Status: DC

## 2014-07-22 ENCOUNTER — Encounter: Payer: Self-pay | Admitting: Gastroenterology

## 2014-07-24 ENCOUNTER — Encounter (HOSPITAL_COMMUNITY): Payer: BC Managed Care – PPO | Attending: Hematology and Oncology

## 2014-07-24 DIAGNOSIS — E785 Hyperlipidemia, unspecified: Secondary | ICD-10-CM | POA: Insufficient documentation

## 2014-07-24 DIAGNOSIS — K429 Umbilical hernia without obstruction or gangrene: Secondary | ICD-10-CM | POA: Insufficient documentation

## 2014-07-24 DIAGNOSIS — C7A026 Malignant carcinoid tumor of the rectum: Secondary | ICD-10-CM | POA: Insufficient documentation

## 2014-07-24 DIAGNOSIS — F430 Acute stress reaction: Secondary | ICD-10-CM | POA: Diagnosis not present

## 2014-07-24 DIAGNOSIS — D3A026 Benign carcinoid tumor of the rectum: Secondary | ICD-10-CM

## 2014-07-24 LAB — CBC WITH DIFFERENTIAL/PLATELET
Basophils Absolute: 0 10*3/uL (ref 0.0–0.1)
Basophils Relative: 1 % (ref 0–1)
EOS PCT: 3 % (ref 0–5)
Eosinophils Absolute: 0.2 10*3/uL (ref 0.0–0.7)
HCT: 40.3 % (ref 39.0–52.0)
HEMOGLOBIN: 13.8 g/dL (ref 13.0–17.0)
LYMPHS ABS: 1.8 10*3/uL (ref 0.7–4.0)
Lymphocytes Relative: 32 % (ref 12–46)
MCH: 30 pg (ref 26.0–34.0)
MCHC: 34.2 g/dL (ref 30.0–36.0)
MCV: 87.6 fL (ref 78.0–100.0)
Monocytes Absolute: 0.4 10*3/uL (ref 0.1–1.0)
Monocytes Relative: 7 % (ref 3–12)
Neutro Abs: 3.3 10*3/uL (ref 1.7–7.7)
Neutrophils Relative %: 57 % (ref 43–77)
Platelets: 162 10*3/uL (ref 150–400)
RBC: 4.6 MIL/uL (ref 4.22–5.81)
RDW: 12.7 % (ref 11.5–15.5)
WBC: 5.8 10*3/uL (ref 4.0–10.5)

## 2014-07-24 LAB — COMPREHENSIVE METABOLIC PANEL
ALK PHOS: 88 U/L (ref 39–117)
ALT: 28 U/L (ref 0–53)
AST: 26 U/L (ref 0–37)
Albumin: 4 g/dL (ref 3.5–5.2)
Anion gap: 11 (ref 5–15)
BUN: 12 mg/dL (ref 6–23)
CALCIUM: 9.2 mg/dL (ref 8.4–10.5)
CO2: 27 mEq/L (ref 19–32)
Chloride: 103 mEq/L (ref 96–112)
Creatinine, Ser: 0.91 mg/dL (ref 0.50–1.35)
GFR, EST NON AFRICAN AMERICAN: 88 mL/min — AB (ref >90)
GLUCOSE: 103 mg/dL — AB (ref 70–99)
POTASSIUM: 3.8 meq/L (ref 3.7–5.3)
Sodium: 141 mEq/L (ref 137–147)
Total Bilirubin: 0.4 mg/dL (ref 0.3–1.2)
Total Protein: 7.4 g/dL (ref 6.0–8.3)

## 2014-07-24 NOTE — Progress Notes (Signed)
Tim Cunningham presented for labwork. Labs per MD order drawn via Peripheral Line 25 gauge needle inserted in rt ac.  Good blood return present. Procedure without incident.  Needle removed intact. Patient tolerated procedure well.

## 2014-07-28 LAB — SEROTONIN SERUM: Serotonin, Serum: 185 ng/mL (ref 56–244)

## 2014-07-29 ENCOUNTER — Encounter (HOSPITAL_COMMUNITY): Payer: Self-pay

## 2014-07-29 ENCOUNTER — Encounter (HOSPITAL_BASED_OUTPATIENT_CLINIC_OR_DEPARTMENT_OTHER): Payer: BC Managed Care – PPO

## 2014-07-29 VITALS — BP 142/77 | HR 58 | Temp 97.9°F | Resp 18 | Wt 193.0 lb

## 2014-07-29 DIAGNOSIS — D3A026 Benign carcinoid tumor of the rectum: Secondary | ICD-10-CM | POA: Diagnosis not present

## 2014-07-29 LAB — CHROMOGRANIN A: Chromogranin A: 5 ng/mL (ref <=15)

## 2014-07-29 NOTE — Progress Notes (Signed)
[facility]  OFFICE PROGRESS NOTE  MUSE,[name], [name] [address] [place] [address]  DIAGNOSIS: No diagnosis found.  No chief complaint on file.   CURRENT THERAPY: Watchful expectation  INTERVAL HISTORY: Tim Cunningham 63 y.o. 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 Last CAT scan on 17/02/9448 showed an umbilical hernia with possible gallstones. He offers no new complaints. Appetite is good with regular bowel movements and no episodes of incontinence or rectal bleeding. He denies any abdominal pain, nausea, vomiting, diarrhea, constipation, lower extremity swelling or redness, skin rash, hot flashes, cough, wheezing, PND, orthopnea, palpitations, skin rash, headache, or seizures.    MEDICAL HISTORY: Past Medical History  Diagnosis Date  . Hyperlipidemia   . GERD (gastroesophageal reflux disease)   . 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    ALLERGIES:  has No Known Allergies.  MEDICATIONS: has a current medication list which includes the following prescription(s): aspirin ec, azelastine, loratadine, multivitamin with minerals, omeprazole, and pravastatin.  SURGICAL HISTORY:  Past Surgical History  Procedure Laterality Date  . Tonsillectomy    . Colonoscopy  11/01/2012    QPR:FFMBW internal hemorrhoids/Moderate diverticulosis/Small lipoma WITH PILLOW LIKE TEXTURE at the hepatic flexure/Two sessile polyps ranging between 3-47m   . Esophagogastroduodenoscopy  11/01/2012    SGYK:ZLDJTTSVring was found/MILD GASTRITIS/DUODENIIS  . Eus  12/11/2012    RECTAL CARCINOID EXCISED-MARGINS NOT CLEAR    FAMILY HISTORY: family history  includes Cancer in his maternal uncle and mother; Colon cancer in his other; Lung cancer in his other.  SOCIAL HISTORY:  reports that he quit smoking about 32 years ago. His smoking use included Cigarettes. He has a 5 pack-year smoking history. He has never used smokeless tobacco. He reports that he drinks alcohol. He reports that he does not use illicit drugs.  REVIEW OF SYSTEMS:  Other than that discussed above is noncontributory.  PHYSICAL EXAMINATION: ECOG PERFORMANCE STATUS: 0 - Asymptomatic  Blood pressure 142/77, pulse 58, temperature 97.9 F (36.6 C), temperature source Oral, resp. rate 18, weight 193 lb (87.544 kg).  GENERAL:alert, no distress and comfortable SKIN: skin color, texture, turgor are normal, no rashes or significant lesions EYES: PERLA; Conjunctiva are pink and non-injected, sclera clear SINUSES: No redness or tenderness over maxillary or ethmoid sinuses OROPHARYNX:no exudate, no erythema on lips, buccal mucosa, or tongue. NECK: supple, thyroid normal size, non-tender, without nodularity. No masses CHEST: Increased AP diameter with no breast masses. LYMPH:  no palpable lymphadenopathy in the cervical, axillary or inguinal LUNGS: clear to auscultation and percussion with normal breathing effort HEART: regular rate & rhythm and no murmurs. ABDOMEN:abdomen soft, non-tender and normal bowel sounds. Small reducible umbilical hernia. MUSCULOSKELETAL:no cyanosis of digits and no clubbing. Range of motion normal.  NEURO: alert & oriented x 3 with fluent speech, no focal motor/sensory deficits   LABORATORY DATA: Lab on 07/24/2014  Component Date Value Ref Range Status  . WBC 07/24/2014 5.8  4.0 - 10.5 K/uL Final  . RBC 07/24/2014 4.60  4.22 - 5.81 MIL/uL Final  . Hemoglobin 07/24/2014 13.8  13.0 - 17.0 g/dL Final  . HCT 07/24/2014 40.3  39.0 - 52.0 % Final  . MCV 07/24/2014  87.6  78.0 - 100.0 fL Final  . MCH 07/24/2014 30.0  26.0 - 34.0 pg Final  . MCHC 07/24/2014  34.2  30.0 - 36.0 g/dL Final  . RDW 07/24/2014 12.7  11.5 - 15.5 % Final  . Platelets 07/24/2014 162  150 - 400 K/uL Final  . Neutrophils Relative % 07/24/2014 57  43 - 77 % Final  . Neutro Abs 07/24/2014 3.3  1.7 - 7.7 K/uL Final  . Lymphocytes Relative 07/24/2014 32  12 - 46 % Final  . Lymphs Abs 07/24/2014 1.8  0.7 - 4.0 K/uL Final  . Monocytes Relative 07/24/2014 7  3 - 12 % Final  . Monocytes Absolute 07/24/2014 0.4  0.1 - 1.0 K/uL Final  . Eosinophils Relative 07/24/2014 3  0 - 5 % Final  . Eosinophils Absolute 07/24/2014 0.2  0.0 - 0.7 K/uL Final  . Basophils Relative 07/24/2014 1  0 - 1 % Final  . Basophils Absolute 07/24/2014 0.0  0.0 - 0.1 K/uL Final  . Sodium 07/24/2014 141  137 - 147 mEq/L Final  . Potassium 07/24/2014 3.8  3.7 - 5.3 mEq/L Final  . Chloride 07/24/2014 103  96 - 112 mEq/L Final  . CO2 07/24/2014 27  19 - 32 mEq/L Final  . Glucose, Bld 07/24/2014 103* 70 - 99 mg/dL Final  . BUN 07/24/2014 12  6 - 23 mg/dL Final  . Creatinine, Ser 07/24/2014 0.91  0.50 - 1.35 mg/dL Final  . Calcium 07/24/2014 9.2  8.4 - 10.5 mg/dL Final  . Total Protein 07/24/2014 7.4  6.0 - 8.3 g/dL Final  . Albumin 07/24/2014 4.0  3.5 - 5.2 g/dL Final  . AST 07/24/2014 26  0 - 37 U/L Final  . ALT 07/24/2014 28  0 - 53 U/L Final  . Alkaline Phosphatase 07/24/2014 88  39 - 117 U/L Final  . Total Bilirubin 07/24/2014 0.4  0.3 - 1.2 mg/dL Final  . GFR calc non Af Amer 07/24/2014 88* >90 mL/min Final  . GFR calc Af Amer 07/24/2014 >90  >90 mL/min Final   Comment: (NOTE)                          The eGFR has been calculated using the CKD EPI equation.                          This calculation has not been validated in all clinical situations.                          eGFR's persistently <90 mL/min signify possible Chronic Kidney                          Disease.  . Anion gap 07/24/2014 11  5 - 15 Final  . 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  ASSESSMENT:  1. 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 Paulita Fujita however with tumor nests seen at the resection border, no evidence of disease with plans to see Dr.Fields for followup. 2. Small umbilical hernia,  asymptomatic    PLAN:  #1. Followup with Dr. Oneida Alar for repeat anoscopy. #2. Followup in 6 months with CBC, chem profile, chromogranin A, serotonin level.   All questions were answered. The patient knows to call the clinic with any problems, questions or concerns. We can certainly see the patient much sooner if necessary.   I spent 25 minutes counseling the patient face to face. The total time spent in the appointment was 30 minutes.    Doroteo Bradford, MD 07/29/2014 9:32 AM  DISCLAIMER:  This note was dictated with voice recognition software.  Similar sounding words can inadvertently be transcribed inaccurately and may not be corrected upon review.

## 2014-07-29 NOTE — Patient Instructions (Signed)
[facility] Discharge Instructions  RECOMMENDATIONS MADE BY THE CONSULTANT AND ANY TEST RESULTS WILL BE SENT TO YOUR REFERRING PHYSICIAN.  Call Dr. Oneida Alar office [phone]) to schedule an anoscopy.  Return here in 6 months for office visit and blood work.   Thank you for choosing [facility] to provide your oncology and hematology care.  To afford each patient quality time with our providers, please arrive at least 15 minutes before your scheduled appointment time.  With your help, our goal is to use those 15 minutes to complete the necessary work-up to ensure our physicians have the information they need to help with your evaluation and healthcare recommendations.    Effective January 1st, 2014, we ask that you re-schedule your appointment with our physicians should you arrive 10 or more minutes late for your appointment.  We strive to give you quality time with our providers, and arriving late affects you and other patients whose appointments are after yours.    Again, thank you for choosing [facility].  Our hope is that these requests will decrease the amount of time that you wait before being seen by our physicians.       _____________________________________________________________  Should you have questions after your visit to [facility], please contact our office at [phone] between the hours of 8:30 a.m. and 4:30 p.m.  Voicemails left after 4:30 p.m. will not be returned until the following business day.  For prescription refill requests, have your pharmacy contact our office with your prescription refill request.    _______________________________________________________________  We hope that we have given you very good care.  You may receive a patient satisfaction survey in the mail, please complete it and return it as soon as possible.  We value your  feedback!  _______________________________________________________________  Have you asked about our STAR program?  STAR stands for Survivorship Training and Rehabilitation, and this is a nationally recognized cancer care program that focuses on survivorship and rehabilitation.  Cancer and cancer treatments may cause problems, such as, pain, making you feel tired and keeping you from doing the things that you need or want to do. Cancer rehabilitation can help. Our goal is to reduce these troubling effects and help you have the best quality of life possible.  You may receive a survey from a nurse that asks questions about your current state of health.  Based on the survey results, all eligible patients will be referred to the [facility] program for an evaluation so we can better serve you!  A frequently asked questions sheet is available upon request.

## 2014-07-29 NOTE — Addendum Note (Signed)
Addended by: Joie Bimler on: 07/29/2014 11:22 AM   Modules accepted: Orders

## 2014-11-04 ENCOUNTER — Encounter: Payer: Self-pay | Admitting: Gastroenterology

## 2014-11-04 ENCOUNTER — Other Ambulatory Visit: Payer: Self-pay

## 2014-11-04 ENCOUNTER — Ambulatory Visit (INDEPENDENT_AMBULATORY_CARE_PROVIDER_SITE_OTHER): Payer: Medicare HMO | Admitting: Gastroenterology

## 2014-11-04 VITALS — BP 156/74 | HR 54 | Temp 98.0°F | Ht 67.0 in | Wt 201.0 lb

## 2014-11-04 DIAGNOSIS — D3A026 Benign carcinoid tumor of the rectum: Secondary | ICD-10-CM

## 2014-11-04 NOTE — Assessment & Plan Note (Signed)
NO SIGNS OR SYMPTOMS OF ACTIVE DISEASE. PRIOR TCS HYPERPLASTIC POLYPS  FOLLOW UP RECTAL U/S TO ASSESS FOR RECURRENCE AND POSSIBLE EXCISION DISCUSSED PROCEDURE, & BENEFITS AS WELL AS ALTERNATIVES. FOLLOW UP IN 1 YEAR.

## 2014-11-04 NOTE — Progress Notes (Signed)
cc'ed to pcp °

## 2014-11-04 NOTE — Progress Notes (Signed)
   Subjective:    Patient ID: Tim Cunningham, male    DOB: 1951-09-03, 63 y.o.   MRN: [id number]  MUSE,[name], [name] GETTING A REGULAR.   HPI Pt here for follow up. Had RUS DEC 2014 BUT MARGINS NOT CLEAN. BMs: 1-2X/DAY.  PT DENIES FEVER, CHILLS, HEMATOCHEZIA, nausea, vomiting, melena, diarrhea, CHEST PAIN, SHORTNESS OF BREATH,  CHANGE IN BOWEL IN HABITS, constipation, abdominal pain, problems swallowing, OR heartburn or indigestion.  Past Medical History  Diagnosis Date  . Hyperlipidemia   . GERD (gastroesophageal reflux disease)   . 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 Tonsillectomy    . Colonoscopy  11/01/2012    YKD:XIPJA internal hemorrhoids/Moderate diverticulosis/Small lipoma WITH PILLOW LIKE TEXTURE at the hepatic flexure/Two sessile polyps ranging between 3-71mm   . Esophagogastroduodenoscopy  11/01/2012    [id number] ring was found/MILD GASTRITIS/DUODENIIS  . Eus  12/11/2012    RECTAL CARCINOID EXCISED-MARGINS NOT CLEAR   No Known Allergies  Current Outpatient Prescriptions  Medication Sig Dispense Refill  . aspirin EC 81 MG tablet Take 81 mg by mouth every other day.    Marland Kitchen azelastine (ASTELIN) 137 MCG/SPRAY nasal spray Place 1 spray into the nose daily as needed. For allergies.    Marland Kitchen loratadine (CLARITIN) 10 MG tablet Take 10 mg by mouth daily as needed. For allergies.    . Multiple Vitamin (MULTIVITAMIN WITH MINERALS) TABS Take 1 tablet by mouth every morning.    Marland Kitchen omeprazole (PRILOSEC) 20 MG capsule Take 1 capsule (20 mg total) by mouth every morning.    . pravastatin (PRAVACHOL) 20 MG tablet Take 20 mg by mouth every morning.      Review of Systems     Objective:   Physical Exam  Constitutional: He is oriented to person, place, and time. He appears well-developed and well-nourished. No distress.  HENT:  Head: Normocephalic and atraumatic.  Mouth/Throat: Oropharynx is clear and moist. No oropharyngeal  exudate.  Eyes: Pupils are equal, round, and reactive to light. No scleral icterus.  Neck: Normal range of motion. Neck supple.  Cardiovascular: Normal rate, regular rhythm and normal heart sounds.   Pulmonary/Chest: Effort normal and breath sounds normal. No respiratory distress.  Abdominal: Soft. Bowel sounds are normal. He exhibits no distension. There is no tenderness.  Musculoskeletal: He exhibits no edema.  Lymphadenopathy:    He has no cervical adenopathy.  Neurological: He is alert and oriented to person, place, and time.  Psychiatric: He has a normal mood and affect.  Vitals reviewed.         Assessment & Plan:

## 2014-11-04 NOTE — Patient Instructions (Signed)
COMPLETE RECTAL ULTRASOUND WITH DR. Paulita Fujita.  FOLLOW A HIGH FIBER DIET. SEE INFO BELOW.  FOLLOW UP IN 1 YEAR.    High-Fiber Diet A high-fiber diet changes your normal diet to include more whole grains, legumes, fruits, and vegetables. Changes in the diet involve replacing refined carbohydrates with unrefined foods. The calorie level of the diet is essentially unchanged. The Dietary Reference Intake (recommended amount) for adult males is 38 grams per day. For adult females, it is 25 grams per day. Pregnant and lactating women should consume 28 grams of fiber per day. Fiber is the intact part of a plant that is not broken down during digestion. Functional fiber is fiber that has been isolated from the plant to provide a beneficial effect in the body. PURPOSE  Increase stool bulk.   Ease and regulate bowel movements.   Lower cholesterol.  INDICATIONS THAT YOU NEED MORE FIBER  Constipation and hemorrhoids.   Uncomplicated diverticulosis (intestine condition) and irritable bowel syndrome.   Weight management.   As a protective measure against hardening of the arteries (atherosclerosis), diabetes, and cancer.   GUIDELINES FOR INCREASING FIBER IN THE DIET  Start adding fiber to the diet slowly. A gradual increase of about 5 more grams (2 slices of whole-wheat bread, 2 servings of most fruits or vegetables, or 1 bowl of high-fiber cereal) per day is best. Too rapid an increase in fiber may result in constipation, flatulence, and bloating.   Drink enough water and fluids to keep your urine clear or pale yellow. Water, juice, or caffeine-free drinks are recommended. Not drinking enough fluid may cause constipation.   Eat a variety of high-fiber foods rather than one type of fiber.   Try to increase your intake of fiber through using high-fiber foods rather than fiber pills or supplements that contain small amounts of fiber.   The goal is to change the types of food eaten. Do not supplement  your present diet with high-fiber foods, but replace foods in your present diet.  INCLUDE A VARIETY OF FIBER SOURCES  Replace refined and processed grains with whole grains, canned fruits with fresh fruits, and incorporate other fiber sources. White rice, white breads, and most bakery goods contain little or no fiber.   Brown whole-grain rice, buckwheat oats, and many fruits and vegetables are all good sources of fiber. These include: broccoli, Brussels sprouts, cabbage, cauliflower, beets, sweet potatoes, white potatoes (skin on), carrots, tomatoes, eggplant, squash, berries, fresh fruits, and dried fruits.   Cereals appear to be the richest source of fiber. Cereal fiber is found in whole grains and bran. Bran is the fiber-rich outer coat of cereal grain, which is largely removed in refining. In whole-grain cereals, the bran remains. In breakfast cereals, the largest amount of fiber is found in those with "bran" in their names. The fiber content is sometimes indicated on the label.   You may need to include additional fruits and vegetables each day.   In baking, for 1 cup white flour, you may use the following substitutions:   1 cup whole-wheat flour minus 2 tablespoons.   1/2 cup white flour plus 1/2 cup whole-wheat flour.

## 2014-11-20 HISTORY — PX: FLEXIBLE SIGMOIDOSCOPY: SHX1649

## 2014-12-23 ENCOUNTER — Ambulatory Visit (INDEPENDENT_AMBULATORY_CARE_PROVIDER_SITE_OTHER): Payer: Medicare HMO | Admitting: Podiatry

## 2014-12-23 ENCOUNTER — Ambulatory Visit (INDEPENDENT_AMBULATORY_CARE_PROVIDER_SITE_OTHER): Payer: Medicare HMO

## 2014-12-23 ENCOUNTER — Encounter: Payer: Self-pay | Admitting: Podiatry

## 2014-12-23 VITALS — BP 177/83 | HR 52 | Resp 12 | Ht 67.0 in | Wt 185.0 lb

## 2014-12-23 DIAGNOSIS — D492 Neoplasm of unspecified behavior of bone, soft tissue, and skin: Secondary | ICD-10-CM

## 2014-12-23 NOTE — Progress Notes (Signed)
   Subjective:    Patient ID: Tim Cunningham, male    DOB: 12-28-1950, 64 y.o.   MRN: [id number]  HPI Comments: N growth L right medial midfoot D 8 years or more O repetitive pressing of pedal C growth, painful to pressure, and callous A pressure and enclosed shoe pressure T [place] podiatrist evaluated no treatment, but recommended surgery  Foot Pain Associated symptoms include a sore throat.   Patient is complaining of difficulty tolerating oh shoe because of a long history of a soft tissue mass on the medial right arch. He says the soft tissue mass is not increased in size over time, however, has become more painful.   Review of Systems  HENT: Positive for sore throat.   Eyes: Positive for itching.  Endocrine: Positive for cold intolerance.  Skin:       Change nails  All other systems reviewed and are negative.      Objective:   Physical Exam  Orientated 3 patient presents with son-in-law  Vascular: DP pulses 2/4 bilaterally PT pulses 2/4 bilaterally  Neurological: Ankle reflex equal and reactive bilaterally Vibratory sensation intact bilaterally Sensation to 10 g monofilament wire intact 5/5 bilaterally  Dermatological: This is firm palpable fibrous lesion in the medial plantar right mid foot approximate 25 mm in diameter. The Lesion does not appear to be adherent to the underlying soft tissue. The lesion is adherent and contiguous with the overlying skin. There is no erythema, edema surrounding this lesion  Musculoskeletal: Pes planus bilaterally There is no restriction ankle, subtalar, midtarsal joints bilaterally  X-ray examination right foot  Intact bony structure without a fracture and/or dislocation Increased soft tissue density approximate 21 mm medial plantar arch There is no overlying bony activity associated with increased soft tissue density  Radiographic impression: No acute bony abnormality noted Increased soft tissue density medial/plantar  right arch     Assessment & Plan:   Assessment: Soft tissue mass right foot  Plan: As  patient is having increasing difficulty tolerating close shoes he is willing to consider the option of excision of this soft tissue mass. I referred him to Dr. Earleen Newport for further evaluation and possible surgical treatment

## 2014-12-23 NOTE — Patient Instructions (Signed)
Our office will reschedule you for consultation with Dr. Earleen Newport concerning the soft tissue growth on your right foot

## 2014-12-30 ENCOUNTER — Ambulatory Visit (INDEPENDENT_AMBULATORY_CARE_PROVIDER_SITE_OTHER): Payer: Medicare HMO | Admitting: Podiatry

## 2014-12-30 ENCOUNTER — Encounter: Payer: Self-pay | Admitting: Podiatry

## 2014-12-30 VITALS — BP 152/82 | HR 59 | Resp 18

## 2014-12-30 DIAGNOSIS — M799 Soft tissue disorder, unspecified: Secondary | ICD-10-CM | POA: Diagnosis not present

## 2014-12-30 DIAGNOSIS — M7989 Other specified soft tissue disorders: Secondary | ICD-10-CM

## 2015-01-04 ENCOUNTER — Encounter: Payer: Self-pay | Admitting: Podiatry

## 2015-01-04 NOTE — Progress Notes (Signed)
Patient ID: Tim Cunningham, male   DOB: 1951/08/31, 64 y.o.   MRN: [id number]  Subjective: 64 year old male presents to the office for surgical consultation of a soft tissue mass on the right medial midfoot. He states this has been present for several years however his been increasing in size. He states that the area is painful, particularly with pressure in shoe gear. He previously has seen another podiatrist outside of our practice in [place] discussed surgery but had no other treatment. He states that the area calluses over top of the lesion. No other complaints at this time.  Objective: AAO 3, NAD DP/PT pulses palpable, CRT less than 3 seconds Protective sensation intact with Simms Weinstein monofilament, vibratory sensation intact, Achilles tendon reflex intact. There is a soft tissue mass present along the medial aspect of the right midfoot measuring approximate 2 x 2 centimeters in length then soft tissue masses appear to be hard in nature and it does appear to be adherent to the overlying skin. The overlying skin does have a chronic reddish discoloration to the lesion. The masses appear to have a uniform consistency. There is a small hyperkeratotic lesion on the central aspect of the mass. There is no tenderness to the upon palpation of the mass at this time. There is no surrounding erythema, edema, increase in warmth. No other soft tissue masses are identified. No other areas of tenderness bilateral lower extremities. MMT 5/5, ROM WNL No open lesions or pre-ulcer lesions identified.  Assessment: 64 year old male right foot soft tissue mass  Plan: -Treatment options were discussed both surgical and conservative including alternatives, risks, complications. -At this time before proceed with surgical intervention will obtain an MRI. -Discussed surgical intervention with the patient which would include excision of the mass with hopeful primary closure versus grafting if needed. -Follow-up  after the MRI. In the meantime, encouraged to call the office with any questions, concerns, change in symptoms.

## 2015-01-14 ENCOUNTER — Telehealth: Payer: Self-pay | Admitting: *Deleted

## 2015-01-14 ENCOUNTER — Ambulatory Visit
Admission: RE | Admit: 2015-01-14 | Discharge: 2015-01-14 | Disposition: A | Discharge status: Home / Self Care | Payer: Commercial Managed Care - HMO | Source: Ambulatory Visit | Attending: Podiatry | Admitting: Podiatry

## 2015-01-14 DIAGNOSIS — M7989 Other specified soft tissue disorders: Secondary | ICD-10-CM

## 2015-01-14 MED ORDER — GADOBENATE DIMEGLUMINE 529 MG/ML IV SOLN
17.0000 mL | Freq: Once | INTRAVENOUS | Status: AC | PRN
  Administered 2015-01-14: 17 mL via INTRAVENOUS

## 2015-01-14 NOTE — Telephone Encounter (Signed)
[facility] [phone] request prior authorization for pt's 400pm this afternoon for MRI with and without contrast.  Pt's insurance [employer] does not have record of prior authorization [phone].

## 2015-01-14 NOTE — Telephone Encounter (Signed)
I called [employer] to get authorization of MRI.  I started out speaking to [facility] and she transferred me to [facility].  I answered all clinical questions.  MRI was authorized from 01/14/15 to 02/14/15.  Authorization number is [id number].  I called and left her a message that the MRI was authorized, I gave her the authorization number and time period.  Call if you have any further questions or concerns.

## 2015-01-18 ENCOUNTER — Encounter (HOSPITAL_COMMUNITY): Payer: Self-pay | Admitting: *Deleted

## 2015-01-18 ENCOUNTER — Telehealth: Payer: Self-pay | Admitting: *Deleted

## 2015-01-18 ENCOUNTER — Emergency Department (HOSPITAL_COMMUNITY)
Admission: EM | Admit: 2015-01-18 | Discharge: 2015-01-19 | Disposition: A | Discharge status: Home / Self Care | Payer: Medicare HMO | Attending: Emergency Medicine | Admitting: Emergency Medicine

## 2015-01-18 DIAGNOSIS — Z79899 Other long term (current) drug therapy: Secondary | ICD-10-CM | POA: Insufficient documentation

## 2015-01-18 DIAGNOSIS — K219 Gastro-esophageal reflux disease without esophagitis: Secondary | ICD-10-CM | POA: Diagnosis not present

## 2015-01-18 DIAGNOSIS — J069 Acute upper respiratory infection, unspecified: Secondary | ICD-10-CM | POA: Insufficient documentation

## 2015-01-18 DIAGNOSIS — E785 Hyperlipidemia, unspecified: Secondary | ICD-10-CM | POA: Insufficient documentation

## 2015-01-18 DIAGNOSIS — Z87891 Personal history of nicotine dependence: Secondary | ICD-10-CM | POA: Insufficient documentation

## 2015-01-18 DIAGNOSIS — Z7982 Long term (current) use of aspirin: Secondary | ICD-10-CM | POA: Insufficient documentation

## 2015-01-18 DIAGNOSIS — Z86018 Personal history of other benign neoplasm: Secondary | ICD-10-CM | POA: Insufficient documentation

## 2015-01-18 DIAGNOSIS — J029 Acute pharyngitis, unspecified: Secondary | ICD-10-CM | POA: Diagnosis present

## 2015-01-18 LAB — RAPID STREP SCREEN (MED CTR MEBANE ONLY): STREPTOCOCCUS, GROUP A SCREEN (DIRECT): NEGATIVE

## 2015-01-18 MED ORDER — DEXAMETHASONE SODIUM PHOSPHATE 4 MG/ML IJ SOLN
10.0000 mg | Freq: Once | INTRAMUSCULAR | Status: AC
  Administered 2015-01-18: 10 mg via INTRAMUSCULAR
  Filled 2015-01-18: qty 3

## 2015-01-18 NOTE — ED Notes (Addendum)
Sore throat, cough, nasal congestion , hoarse

## 2015-01-18 NOTE — Discharge Instructions (Signed)
Continue to take the medication you started. Use a cool mist vaporizer, use chloraseptic spray and salt water gargles. Follow up with your doctor. Return here as needed for worsening symptoms.   Cool Mist Vaporizers Vaporizers may help relieve the symptoms of a cough and cold. They add moisture to the air, which helps mucus to become thinner and less sticky. This makes it easier to breathe and cough up secretions. Cool mist vaporizers do not cause serious burns like hot mist vaporizers, which may also be called steamers or humidifiers. Vaporizers have not been proven to help with colds. You should not use a vaporizer if you are allergic to mold. HOME CARE INSTRUCTIONS  Follow the package instructions for the vaporizer.  Do not use anything other than distilled water in the vaporizer.  Do not run the vaporizer all of the time. This can cause mold or bacteria to grow in the vaporizer.  Clean the vaporizer after each time it is used.  Clean and dry the vaporizer well before storing it.  Stop using the vaporizer if worsening respiratory symptoms develop. Document Released: 08/03/2004 Document Revised: 11/11/2013 Document Reviewed: 03/26/2013 [facility] Patient Information 2015 [place], [place]. This information is not intended to replace advice given to you by your health care provider. Make sure you discuss any questions you have with your health care provider.  Upper Respiratory Infection, Adult An upper respiratory infection (URI) is also sometimes known as the common cold. The upper respiratory tract includes the nose, sinuses, throat, trachea, and bronchi. Bronchi are the airways leading to the lungs. Most people improve within 1 week, but symptoms can last up to 2 weeks. A residual cough may last even longer.  CAUSES Many different viruses can infect the tissues lining the upper respiratory tract. The tissues become irritated and inflamed and often become very moist. Mucus production is also  common. A cold is contagious. You can easily spread the virus to others by oral contact. This includes kissing, sharing a glass, coughing, or sneezing. Touching your mouth or nose and then touching a surface, which is then touched by another person, can also spread the virus. SYMPTOMS  Symptoms typically develop 1 to 3 days after you come in contact with a cold virus. Symptoms vary from person to person. They may include:  Runny nose.  Sneezing.  Nasal congestion.  Sinus irritation.  Sore throat.  Loss of voice (laryngitis).  Cough.  Fatigue.  Muscle aches.  Loss of appetite.  Headache.  Low-grade fever. DIAGNOSIS  You might diagnose your own cold based on familiar symptoms, since most people get a cold 2 to 3 times a year. Your caregiver can confirm this based on your exam. Most importantly, your caregiver can check that your symptoms are not due to another disease such as strep throat, sinusitis, pneumonia, asthma, or epiglottitis. Blood tests, throat tests, and X-rays are not necessary to diagnose a common cold, but they may sometimes be helpful in excluding other more serious diseases. Your caregiver will decide if any further tests are required. RISKS AND COMPLICATIONS  You may be at risk for a more severe case of the common cold if you smoke cigarettes, have chronic heart disease (such as heart failure) or lung disease (such as asthma), or if you have a weakened immune system. The very young and very old are also at risk for more serious infections. Bacterial sinusitis, middle ear infections, and bacterial pneumonia can complicate the common cold. The common cold can worsen asthma and  chronic obstructive pulmonary disease (COPD). Sometimes, these complications can require emergency medical care and may be life-threatening. PREVENTION  The best way to protect against getting a cold is to practice good hygiene. Avoid oral or hand contact with people with cold symptoms. Wash your  hands often if contact occurs. There is no clear evidence that vitamin C, vitamin E, echinacea, or exercise reduces the chance of developing a cold. However, it is always recommended to get plenty of rest and practice good nutrition. TREATMENT  Treatment is directed at relieving symptoms. There is no cure. Antibiotics are not effective, because the infection is caused by a virus, not by bacteria. Treatment may include:  Increased fluid intake. Sports drinks offer valuable electrolytes, sugars, and fluids.  Breathing heated mist or steam (vaporizer or shower).  Eating chicken soup or other clear broths, and maintaining good nutrition.  Getting plenty of rest.  Using gargles or lozenges for comfort.  Controlling fevers with ibuprofen or acetaminophen as directed by your caregiver.  Increasing usage of your inhaler if you have asthma. Zinc gel and zinc lozenges, taken in the first 24 hours of the common cold, can shorten the duration and lessen the severity of symptoms. Pain medicines may help with fever, muscle aches, and throat pain. A variety of non-prescription medicines are available to treat congestion and runny nose. Your caregiver can make recommendations and may suggest nasal or lung inhalers for other symptoms.  HOME CARE INSTRUCTIONS   Only take over-the-counter or prescription medicines for pain, discomfort, or fever as directed by your caregiver.  Use a warm mist humidifier or inhale steam from a shower to increase air moisture. This may keep secretions moist and make it easier to breathe.  Drink enough water and fluids to keep your urine clear or pale yellow.  Rest as needed.  Return to work when your temperature has returned to normal or as your caregiver advises. You may need to stay home longer to avoid infecting others. You can also use a face mask and careful hand washing to prevent spread of the virus. SEEK MEDICAL CARE IF:   After the first few days, you feel you are  getting worse rather than better.  You need your caregiver's advice about medicines to control symptoms.  You develop chills, worsening shortness of breath, or brown or red sputum. These may be signs of pneumonia.  You develop yellow or brown nasal discharge or pain in the face, especially when you bend forward. These may be signs of sinusitis.  You develop a fever, swollen neck glands, pain with swallowing, or white areas in the back of your throat. These may be signs of strep throat. SEEK IMMEDIATE MEDICAL CARE IF:   You have a fever.  You develop severe or persistent headache, ear pain, sinus pain, or chest pain.  You develop wheezing, a prolonged cough, cough up blood, or have a change in your usual mucus (if you have chronic lung disease).  You develop sore muscles or a stiff neck. Document Released: 05/02/2001 Document Revised: 01/29/2012 Document Reviewed: 02/11/2014 [facility] Patient Information 2015 [place], [place]. This information is not intended to replace advice given to you by your health care provider. Make sure you discuss any questions you have with your health care provider.

## 2015-01-18 NOTE — Telephone Encounter (Signed)
"  Somebody called me and asked me to call and schedule an appointment.  I had my test done."  I will transfer you to a scheduler and she will schedule you to see Dr. Jacqualyn Posey for MRI results.  "Okay, thank you."

## 2015-01-18 NOTE — ED Provider Notes (Signed)
CSN: [id number]     Arrival date & time 01/18/15  2225 History   First MD Initiated Contact with Patient 01/18/15 2238     Chief Complaint  Patient presents with  . Sore Throat     (Consider location/radiation/quality/duration/timing/severity/associated sxs/prior Treatment) Patient is a 64 y.o. male presenting with pharyngitis. The history is provided by the patient.  Sore Throat This is a new problem. The current episode started yesterday. The problem occurs constantly. The problem has been gradually worsening. Associated symptoms include coughing (dry) and a sore throat. Pertinent negatives include no chest pain, chills, fever, nausea, rash, urinary symptoms or vomiting.   TEDDRICK MALLARI is a 64 y.o. male who presents to the ED with sore throat, hoarseness,cough and nasal congestion that started yesterday. He has been taking OTC medications without relief.   Past Medical History  Diagnosis Date  . Hyperlipidemia   . GERD (gastroesophageal reflux disease)   . 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 Tonsillectomy    . Colonoscopy  11/01/2012    SLF: SML IH, RECTAL CARCINOID, HYPEPRPLASTIC POLYPS(2), Moderate TICS/Small lipoma IN HF  . Esophagogastroduodenoscopy  11/01/2012    YIR:SWNIOEVO ring was found/MILD GASTRITIS/DUODENIIS  . Eus  12/11/2012    RECTAL CARCINOID EXCISED-MARGINS NOT CLEAR   Family History  Problem Relation Age of Onset  . Colon cancer Other     maternal great uncle  . Lung cancer Other     uncles  . Cancer Mother   . Cancer Maternal Uncle    History  Substance Use Topics  . Smoking status: Former Smoker -- 0.50 packs/day for 10 years    Types: Cigarettes    Quit date: 11/01/1981  . Smokeless tobacco: Never Used     Comment: quit 1999  . Alcohol Use: Yes     Comment: 1-2 drinks a month    Review of Systems  Constitutional: Negative for fever and chills.  HENT: Positive for sore throat.    Respiratory: Positive for cough (dry).   Cardiovascular: Negative for chest pain.  Gastrointestinal: Negative for nausea and vomiting.  Skin: Negative for rash.  all other systems negative    Allergies  Review of patient's allergies indicates no known allergies.  Home Medications   Prior to Admission medications   Medication Sig Start Date End Date Taking? Authorizing Provider  aspirin EC 81 MG tablet Take 81 mg by mouth every other day.   Yes Historical Provider, MD  azelastine (ASTELIN) 137 MCG/SPRAY nasal spray Place 1 spray into the nose daily as needed. For allergies.   Yes Historical Provider, MD  loratadine (CLARITIN) 10 MG tablet Take 10 mg by mouth daily as needed. For allergies.   Yes Historical Provider, MD  Multiple Vitamin (MULTIVITAMIN WITH MINERALS) TABS Take 1 tablet by mouth every morning.   Yes Historical Provider, MD  omeprazole (PRILOSEC) 20 MG capsule Take 1 capsule (20 mg total) by mouth every morning. 02/16/14  Yes Mahala Menghini, [name]  Phenyleph-Doxylamine-DM-APAP 5-6.25-10-325 MG/15ML LIQD Take 5-10 mLs by mouth daily as needed (FOR COUGH AND CONGESTION).   Yes Historical Provider, MD  pravastatin (PRAVACHOL) 20 MG tablet Take 20 mg by mouth every morning.   Yes Historical Provider, MD   BP 147/77 mmHg  Pulse 68  Temp(Src) 97.4 F (36.3 C) (Oral)  Resp 17  Ht 5\' 8"  (1.727 m)  Wt 185 lb (83.915 kg)  BMI 28.14 kg/m2  SpO2 95%  Physical Exam  Constitutional: He is oriented to person, place, and time. He appears well-developed and well-nourished.  HENT:  Head: Normocephalic and atraumatic.  Right Ear: Tympanic membrane normal.  Left Ear: Tympanic membrane normal.  Nose: Nose normal.  Mouth/Throat: Uvula is midline and mucous membranes are normal. Posterior oropharyngeal erythema: mild.  No pharyngeal or tonsillar exudate  Eyes: Conjunctivae and EOM are normal. Pupils are equal, round, and reactive to light.  Neck: Normal range of motion. Neck supple.   Cardiovascular: Normal rate and regular rhythm.   Pulmonary/Chest: Effort normal. He has no wheezes. He has no rales.  Abdominal: Soft. There is no tenderness.  Musculoskeletal: Normal range of motion.  Lymphadenopathy:    He has no cervical adenopathy.  Neurological: He is alert and oriented to person, place, and time. No cranial nerve deficit.  Skin: Skin is warm and dry.  Psychiatric: He has a normal mood and affect. His behavior is normal.  Nursing note and vitals reviewed.   ED Course  Procedures (including critical care time) Decadron 10 mg IM Labs Review Results for orders placed or performed during the hospital encounter of 01/18/15 (from the past 24 hour(s))  Rapid strep screen     Status: None   Collection Time: 01/18/15 10:57 PM  Result Value Ref Range   Streptococcus, Group A Screen (Direct) NEGATIVE NEGATIVE    This patient was examined by me and by Dr. Roxanne Mins.   MDM  64 y.o. male with URI symptoms. Stable for d/c without respiratory problems.  O2 SAT 99% on R/A and lungs clear. Sore throat without difficulty swallowing and no tonsillar abscess and negative strep screen. Will treat for viral URI. Patient to follow up with his PCP. He will return here as needed for worsening symptoms.   Final diagnoses:  URI, acute      Ashley Murrain, NP 93/23/55 [id number]  Delora Fuel, MD 02/54/27 0623

## 2015-01-21 LAB — CULTURE, GROUP A STREP: Strep A Culture: NEGATIVE

## 2015-01-26 ENCOUNTER — Other Ambulatory Visit (HOSPITAL_COMMUNITY): Payer: BC Managed Care – PPO

## 2015-01-27 ENCOUNTER — Ambulatory Visit: Payer: Commercial Managed Care - HMO | Admitting: Podiatry

## 2015-01-28 ENCOUNTER — Encounter (HOSPITAL_COMMUNITY): Payer: Medicare HMO | Attending: Hematology & Oncology | Admitting: Hematology & Oncology

## 2015-01-28 ENCOUNTER — Encounter (HOSPITAL_BASED_OUTPATIENT_CLINIC_OR_DEPARTMENT_OTHER): Payer: Medicare HMO

## 2015-01-28 ENCOUNTER — Encounter (HOSPITAL_COMMUNITY): Payer: Self-pay | Admitting: Hematology & Oncology

## 2015-01-28 VITALS — BP 146/80 | HR 74 | Temp 98.0°F | Resp 16 | Wt 193.1 lb

## 2015-01-28 DIAGNOSIS — D3A026 Benign carcinoid tumor of the rectum: Secondary | ICD-10-CM | POA: Insufficient documentation

## 2015-01-28 DIAGNOSIS — C7A026 Malignant carcinoid tumor of the rectum: Secondary | ICD-10-CM

## 2015-01-28 DIAGNOSIS — Z23 Encounter for immunization: Secondary | ICD-10-CM

## 2015-01-28 DIAGNOSIS — Z0000 Encounter for general adult medical examination without abnormal findings: Secondary | ICD-10-CM

## 2015-01-28 LAB — COMPREHENSIVE METABOLIC PANEL
ALK PHOS: 108 U/L (ref 39–117)
ALT: 83 U/L — AB (ref 0–53)
AST: 46 U/L — ABNORMAL HIGH (ref 0–37)
Albumin: 4.2 g/dL (ref 3.5–5.2)
Anion gap: 5 (ref 5–15)
BUN: 12 mg/dL (ref 6–23)
CO2: 28 mmol/L (ref 19–32)
CREATININE: 0.96 mg/dL (ref 0.50–1.35)
Calcium: 9.2 mg/dL (ref 8.4–10.5)
Chloride: 104 mmol/L (ref 96–112)
GFR calc Af Amer: >90 mL/min (ref >90)
GFR calc non Af Amer: 85 mL/min — ABNORMAL LOW (ref >90)
Glucose, Bld: 105 mg/dL — ABNORMAL HIGH (ref 70–99)
POTASSIUM: 3.8 mmol/L (ref 3.5–5.1)
SODIUM: 137 mmol/L (ref 135–145)
Total Bilirubin: 0.9 mg/dL (ref 0.3–1.2)
Total Protein: 7.3 g/dL (ref 6.0–8.3)

## 2015-01-28 LAB — CBC WITH DIFFERENTIAL/PLATELET
BASOS PCT: 0 % (ref 0–1)
Basophils Absolute: 0 10*3/uL (ref 0.0–0.1)
EOS ABS: 0.2 10*3/uL (ref 0.0–0.7)
Eosinophils Relative: 3 % (ref 0–5)
HCT: 40.2 % (ref 39.0–52.0)
Hemoglobin: 13.3 g/dL (ref 13.0–17.0)
LYMPHS ABS: 0.7 10*3/uL (ref 0.7–4.0)
Lymphocytes Relative: 12 % (ref 12–46)
MCH: 29.6 pg (ref 26.0–34.0)
MCHC: 33.1 g/dL (ref 30.0–36.0)
MCV: 89.3 fL (ref 78.0–100.0)
MONO ABS: 0.9 10*3/uL (ref 0.1–1.0)
Monocytes Relative: 15 % — ABNORMAL HIGH (ref 3–12)
NEUTROS PCT: 70 % (ref 43–77)
Neutro Abs: 4 10*3/uL (ref 1.7–7.7)
PLATELETS: 154 10*3/uL (ref 150–400)
RBC: 4.5 MIL/uL (ref 4.22–5.81)
RDW: 12.7 % (ref 11.5–15.5)
WBC: 5.7 10*3/uL (ref 4.0–10.5)

## 2015-01-28 MED ORDER — INFLUENZA VAC SPLIT QUAD 0.5 ML IM SUSY
0.5000 mL | PREFILLED_SYRINGE | Freq: Once | INTRAMUSCULAR | Status: AC
  Administered 2015-01-28: 0.5 mL via INTRAMUSCULAR
  Filled 2015-01-28: qty 0.5

## 2015-01-28 NOTE — Progress Notes (Signed)
Tim Cunningham presents today for injection per MD orders. Flu Vaccine administered SQ in left deltoid. Administration without incident. Patient tolerated well.

## 2015-01-28 NOTE — Patient Instructions (Addendum)
[facility] Discharge Instructions  RECOMMENDATIONS MADE BY THE CONSULTANT AND ANY TEST RESULTS WILL BE SENT TO YOUR REFERRING PHYSICIAN.  Exam and discussion by Dr. Whitney Muse. Will check some labs today and will call you if there's anything of concern. Report uncontrolled diarrhea or other concerns.  Follow-up in 4 months with labs and office visit.  Thank you for choosing [facility] to provide your oncology and hematology care.  To afford each patient quality time with our provider, please arrive at least 15 minutes before your scheduled appointment time.    You need to re-schedule your appointment should you arrive 10 or more minutes late.  We strive to give you quality time with our providers, and arriving late affects you and other patients whose appointments are after yours.  Also, if you no show three or more times for appointments you may be dismissed from the clinic at the providers discretion.     Again, thank you for choosing [facility].  Our hope is that these requests will decrease the amount of time that you wait before being seen by our physicians.       _____________________________________________________________  Should you have questions after your visit to [facility], please contact our office at [phone] between the hours of 8:30 a.m. and 4:30 p.m.  Voicemails left after 4:30 p.m. will not be returned until the following business day.  For prescription refill requests, have your pharmacy contact our office.

## 2015-01-28 NOTE — Progress Notes (Signed)
LABS DRAWN

## 2015-01-28 NOTE — Progress Notes (Signed)
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 Paulita Fujita however with tumor nests seen at the resection border, no evidence of disease with plans to see Dr.Fields for followup.  CURRENT THERAPY: Observation  INTERVAL HISTORY: Tim Cunningham 64 y.o. male returns for follow-up of the diagnosis of carcinoid found on a screening colonoscopy. He has no complaints today. He describes his appetite as good. His bowel movements are normal. His energy level is excellent. He just had a screening colonoscopy with Dr. Oneida Alar in January.  MEDICAL HISTORY: Past Medical History  Diagnosis Date  . Hyperlipidemia   . GERD (gastroesophageal reflux disease)   . Carcinoid tumor of rectum DEC 3295    has Umbilical hernia without obstruction or gangrene; Heme positive stool; Carcinoid tumor of rectum; and Colon cancer screening on his problem list.     has No Known Allergies.  Tim Cunningham does not currently have medications on file.  SURGICAL HISTORY: Past Surgical History  Procedure Laterality Date  . Tonsillectomy    . Colonoscopy  11/01/2012    SLF: SML IH, RECTAL CARCINOID, HYPEPRPLASTIC POLYPS(2), Moderate TICS/Small lipoma IN HF  . Esophagogastroduodenoscopy  11/01/2012    JOA:CZYSAYTK ring was found/MILD GASTRITIS/DUODENIIS  . Eus  12/11/2012    RECTAL CARCINOID EXCISED-MARGINS NOT CLEAR    SOCIAL HISTORY: History   Social History  . Marital Status: Divorced    Spouse Name: N/A  . Number of Children: 5  . Years of Education: N/A   Occupational History  . unemployed    Social History Main Topics  . Smoking status: Former Smoker -- 0.50 packs/day for 10 years    Types: Cigarettes    Quit date: 11/01/1981  . Smokeless tobacco: Never Used     Comment: quit 1999  . Alcohol Use: Yes     Comment: 1-2 drinks a month  . Drug Use: No  . Sexual Activity: Yes     Birth Control/ Protection: None   Other Topics Concern  . Not on file   Social History Narrative    FAMILY HISTORY: Family History  Problem Relation Age of Onset  . Colon cancer Other     maternal great uncle  . Lung cancer Other     uncles  . Cancer Mother   . Cancer Maternal Uncle     Review of Systems  Constitutional: Negative for fever, chills, weight loss and malaise/fatigue.  HENT: Negative for congestion, hearing loss, nosebleeds, sore throat and tinnitus.   Eyes: Negative for blurred vision, double vision, pain and discharge.  Respiratory: Negative for cough, hemoptysis, sputum production, shortness of breath and wheezing.   Cardiovascular: Negative for chest pain, palpitations, claudication, leg swelling and PND.  Gastrointestinal: Negative for heartburn, nausea, vomiting, abdominal pain, diarrhea, constipation, blood in stool and melena.  Genitourinary: Negative for dysuria, urgency, frequency and hematuria.  Musculoskeletal: Negative for myalgias, joint pain and falls.  Skin: Negative for itching and rash.  Neurological: Negative for dizziness, tingling, tremors, sensory change, speech change, focal weakness, seizures, loss of consciousness, weakness and headaches.  Endo/Heme/Allergies: Does not bruise/bleed easily.  Psychiatric/Behavioral: Negative for depression, suicidal ideas, memory loss and substance abuse. The patient is not nervous/anxious and does not have insomnia.     PHYSICAL EXAMINATION  ECOG PERFORMANCE STATUS: 0 - Asymptomatic  There were no vitals filed for this visit.  Physical Exam  Constitutional: He is oriented to person, place, and time and well-developed, well-nourished, and in no distress.  HENT:  Head: Normocephalic and atraumatic.  Nose: Nose normal.  Mouth/Throat: Oropharynx is clear and moist. No oropharyngeal exudate.  Eyes: Conjunctivae and EOM are normal. Pupils are equal, round, and reactive to light. Right eye exhibits no  discharge. Left eye exhibits no discharge. No scleral icterus.  Neck: Normal range of motion. Neck supple. No tracheal deviation present. No thyromegaly present.  Cardiovascular: Normal rate, regular rhythm and normal heart sounds.  Exam reveals no gallop and no friction rub.   No murmur heard. Pulmonary/Chest: Effort normal and breath sounds normal. He has no wheezes. He has no rales.  Abdominal: Soft. Bowel sounds are normal. He exhibits no distension and no mass. There is no tenderness. There is no rebound and no guarding.  Musculoskeletal: Normal range of motion. He exhibits no edema.  Lymphadenopathy:    He has no cervical adenopathy.  Neurological: He is alert and oriented to person, place, and time. He has normal reflexes. No cranial nerve deficit. Gait normal. Coordination normal.  Skin: Skin is warm and dry. No rash noted.  Psychiatric: Mood, memory, affect and judgment normal.  Nursing note and vitals reviewed.   LABORATORY DATA:  CBC    Component Value Date/Time   WBC 5.8 07/24/2014 0834   RBC 4.60 07/24/2014 0834   HGB 13.8 07/24/2014 0834   HCT 40.3 07/24/2014 0834   HCT 41 09/02/2012 1056   PLT 162 07/24/2014 0834   MCV 87.6 07/24/2014 0834   MCV 86.4 09/02/2012 1056   MCH 30.0 07/24/2014 0834   MCHC 34.2 07/24/2014 0834   RDW 12.7 07/24/2014 0834   LYMPHSABS 1.8 07/24/2014 0834   MONOABS 0.4 07/24/2014 0834   EOSABS 0.2 07/24/2014 0834   BASOSABS 0.0 07/24/2014 0834   CMP     Component Value Date/Time   NA 141 07/24/2014 0834   NA 140 09/02/2012 1058   K 3.8 07/24/2014 0834   K 4.2 09/02/2012 1058   CL 103 07/24/2014 0834   CL 105 09/02/2012 1058   CO2 27 07/24/2014 0834   GLUCOSE 103* 07/24/2014 0834   BUN 12 07/24/2014 0834   BUN 10 09/02/2012 1058   CREATININE 0.91 07/24/2014 0834   CREATININE 0.79 09/02/2012 1058   CALCIUM 9.2 07/24/2014 0834   CALCIUM 9.6 09/02/2012 1058   PROT 7.4 07/24/2014 0834   PROT 7.0 09/02/2012 1058   ALBUMIN 4.0  07/24/2014 0834   AST 26 07/24/2014 0834   AST 24 09/02/2012 1058   ALT 28 07/24/2014 0834   ALKPHOS 88 07/24/2014 0834   ALKPHOS 76 09/02/2012 1058   BILITOT 0.4 07/24/2014 0834   BILITOT 0.6 09/02/2012 1058   GFRNONAA 88* 07/24/2014 0834   GFRAA >90 07/24/2014 0834     PENDING LABS:   RADIOGRAPHIC STUDIES:  No results found.   PATHOLOGY:  FINAL DIAGNOSIS Diagnosis 11/01/12 1. Colon, biopsy, lipoma at hepatic flexure and rectum - SUBMUCOSAL CARCINOID TUMOR. - SUBMUCOSAL MATURE ADIPOSE TISSUE. PLEASE SEE COMMENT. 2. Colon, polyp(s), sigmoid and rectal - HYPERPLASTIC POLYP(S). NO ADENOMATOUS CHANGE OR MALIGNANCY IDENTIFIED. 3. Duodenum, NOS biopsy - BENIGN SMALL BOWEL MUCOSA. NO VILLOUS ATROPHY, INFLAMMATION OR OTHER ABNORMALITIES PRESENT. 4. Stomach, biopsy - GASTRIC BODY AND ANTRAL TYPE MUCOSA WITH ASSOCIATED MINIMAL CHRONIC INFLAMMATION AND REACTIVE CHANGES. - NO EVIDENCE OF HELICOBACTER PYLORI, INTESTINAL METAPLASIA, DYSPLASIA OR MALIGNANCY. Microscopic Comment 1. There are clusters and nests of neoplastic cells with uniform nuclei  and inconspicuous nucleoli. There is no evidence of necrosis or significant mitotic activity identified in this material. The overall morphologic features are diagnostic for carcinoid tumor. The tumor involves the biopsy edges. Clinical correlation is recommended. Case was discussed with Dr. Oneida Alar on 11/04/12. 3. There is small bowel mucosa with normal villous architecture and no objective increase in inflammation. No villous atrophy, active inflammation or other significant changes identified. 4. A Warthin-Starry stain is performed to determine the possibility of the presence of Helicobacter pylori. The Warthin-Starry stain is negative for organisms of Helicobacter pylori. (HCL:eps 11/04/12) Aldona Bar MD Pathologist, Electronic Signature (Case signed 11/04/2012) Specimen Gross and Clinical Information FINAL DIAGNOSIS Diagnosis   12/11/2012 Rectum, polyp(s) - LOW GRADE, WELL DIFFERENTIATED NEUROENDOCRINE TUMOR (CARCINOID) (0.8 CM). - TUMOR FOCALLY EXTENDS TO CAUTERIZED TISSUE EDGE/POLYPECTOMY MARGIN. - SEE COMMENT. Microscopic Comment The morphology is diagnostic of carcinoid tumor. There are no atypical findings identified. Although the tumor is almost entirely excised, there are a few nests of tumor that are focally present at the cauterized tissue edge/polypectomy margin. (CR:kh 12-12-12) Mali RUND DO Pathologist, Electronic   ASSESSMENT and THERAPY PLAN:   CARCINOID  64 year old male with carcinoid found on a biopsy during colonoscopy in 2013. He has just undergone a repeat colonoscopy in January with no abnormality noted. He underwent an excision of the neuroendocrine tumor with Dr. Paulita Fujita in January 2014. Tumor extended to the cauterized tissue edge and polypectomy margin. He has no obvious evidence of progressive disease/recurrence. Laboratory studies today are pending. I advised the patient will notify him of his laboratory study results when they become available. I will see him back again in 4 months with repeat labs.  All questions were answered. The patient knows to call the clinic with any problems, questions or concerns. We can certainly see the patient much sooner if necessary. This note was electronically signed. Molli Hazard 01/28/2015

## 2015-01-29 ENCOUNTER — Encounter: Payer: Self-pay | Admitting: Podiatry

## 2015-01-29 ENCOUNTER — Ambulatory Visit (INDEPENDENT_AMBULATORY_CARE_PROVIDER_SITE_OTHER): Payer: Medicare HMO | Admitting: Podiatry

## 2015-01-29 ENCOUNTER — Telehealth (HOSPITAL_COMMUNITY): Payer: Self-pay

## 2015-01-29 VITALS — BP 135/80 | HR 92

## 2015-01-29 DIAGNOSIS — D492 Neoplasm of unspecified behavior of bone, soft tissue, and skin: Secondary | ICD-10-CM | POA: Diagnosis not present

## 2015-01-29 NOTE — Telephone Encounter (Signed)
Message left for patient to call back to schedule redraw of Serotonin level (due to lab error test was not done).

## 2015-01-29 NOTE — Progress Notes (Signed)
   Subjective:    Patient ID: Tim Cunningham, male    DOB: 11-Jul-1951, 64 y.o.   MRN: [id number]  HPI 64 year old male presents the office they discuss MRI results as well as to discuss surgical intervention. He denies any acute changes since last appointment and no other complaints at this time.   Review of Systems  All other systems reviewed and are negative.      Objective:   Physical Exam AAO 3, NAD DP/PT pulses palpable, CRT less than 3 seconds Protective sensation intact with Simms Weinstein monofilament, decreased vibratory sensation, Achilles tendon reflex intact. Soft tissue mass present on the medial aspect of the right foot along the midfoot measuring approximately 2 x 2 centimeters. The soft tissue mass appears to be firm in nature. There is a small hyperkeratotic lesion along the dorsal aspect of the lesion. The mass is not mobile. It does have uniform consistency. There is mild tenderness on palpation of the mass. There is no surrounding erythema, edema, increase in warmth. The surrounding skin appears to be intact. No other lesions identified bilaterally. No open lesions or pre-ulcer lesions identified elsewhere. No pain with calf compression, swelling, warmth, erythema.      Assessment & Plan:  64 year old male with right foot soft tissue mass -MRI results were discussed the patient which revealed and encapsulated lesion in the subcutaneous tissues of the right foot which is nonspecific but has benign features. Differential considerations include lesion such as a fibroma or old epidermal inclusion cyst. -Surgical intervention was discussed the patient including alternatives, risks, complications. The proposed surgery to the patient was excision of the soft tissue mass. However given the location a size to the mass discussed wound closure with the patient. Discussed with him and I would attempt primary closure the wound however there may be a need for a graft. Discussed with  him possible Integra graft if needed. I discussed with him the postoperative course which would most likely involve nonweightbearing however this would be determined postoperatively. I discussed with him risks of the surgery which include, but not limited to, infection, bleeding, pain, swelling, need for further surgery, delayed or nonhealing, painful or ugly scar, numbness or sensation changes, reoccurrence, over/under correction, nonhealing wound or wound dehiscence, DVT/PE. Patient understands these risks and would like to proceed with surgery. All 3 pages of the surgical consent were reviewed with the patient and signed. No promises or guarantees were given as the outcome of the procedure and all questions were answered to the best of my ability. -Surgery will be performed of the [facility] specialty surgical center. In the meantime encouraged to call the office with any questions, concerns, change in symptoms.

## 2015-01-29 NOTE — Patient Instructions (Signed)
Pre-Operative Instructions  Congratulations, you have decided to take an important step to improving your quality of life.  You can be assured that the doctors of [facility] will be with you every step of the way.  1. Plan to be at the surgery center/hospital at least 1 (one) hour prior to your scheduled time unless otherwise directed by the surgical center/hospital staff.  You must have a responsible adult accompany you, remain during the surgery and drive you home.  Make sure you have directions to the surgical center/hospital and know how to get there on time. 2. For hospital based surgery you will need to obtain a history and physical form from your family physician within 1 month prior to the date of surgery- we will give you a form for you primary physician.  3. We make every effort to accommodate the date you request for surgery.  There are however, times where surgery dates or times have to be moved.  We will contact you as soon as possible if a change in schedule is required.   4. No Aspirin/Ibuprofen for one week before surgery.  If you are on aspirin, any non-steroidal anti-inflammatory medications (Mobic, Aleve, Ibuprofen) you should stop taking it 7 days prior to your surgery.  You make take Tylenol  For pain prior to surgery.  5. Medications- If you are taking daily heart and blood pressure medications, seizure, reflux, allergy, asthma, anxiety, pain or diabetes medications, make sure the surgery center/hospital is aware before the day of surgery so they may notify you which medications to take or avoid the day of surgery. 6. No food or drink after midnight the night before surgery unless directed otherwise by surgical center/hospital staff. 7. No alcoholic beverages 24 hours prior to surgery.  No smoking 24 hours prior to or 24 hours after surgery. 8. Wear loose pants or shorts- loose enough to fit over bandages, boots, and casts. 9. No slip on shoes, sneakers are best. 10. Bring  your boot with you to the surgery center/hospital.  Also bring crutches or a walker if your physician has prescribed it for you.  If you do not have this equipment, it will be provided for you after surgery. 11. If you have not been contracted by the surgery center/hospital by the day before your surgery, call to confirm the date and time of your surgery. 12. Leave-time from work may vary depending on the type of surgery you have.  Appropriate arrangements should be made prior to surgery with your employer. 13. Prescriptions will be provided immediately following surgery by your doctor.  Have these filled as soon as possible after surgery and take the medication as directed. 14. Remove nail polish on the operative foot. 15. Wash the night before surgery.  The night before surgery wash the foot and leg well with the antibacterial soap provided and water paying special attention to beneath the toenails and in between the toes.  Rinse thoroughly with water and dry well with a towel.  Perform this wash unless told not to do so by your physician.  Enclosed: 1 Ice pack (please put in freezer the night before surgery)   1 Hibiclens skin cleaner   Pre-op Instructions  If you have any questions regarding the instructions, do not hesitate to call our office.  [place]: [address] [place], [place] [address] [phone]  : [address] , [place] [address] [phone]  [place]: [address]  [place], [place] [address] [phone]  Dr. Richard   Tuchman DPM, Dr. Norman Regal DPM Dr. Richard Sikora DPM, Dr. M. Todd Hyatt DPM, Dr. Kathryn Egerton DPM, Dr. Matthew Wagoner DPM 

## 2015-01-29 NOTE — Addendum Note (Signed)
Addended by: Sherry Ruffing D on: 01/29/2015 03:03 PM   Modules accepted: Orders

## 2015-01-29 NOTE — Progress Notes (Signed)
01/29/2015 3:00 PM Per Mariel Kansky in the lab, the patient's Serotonin drawn on 01/28/15 must be recollected due to lab error.  [name] contacted and advised of same, requested to return for re-draw.  New orders placed.

## 2015-01-31 ENCOUNTER — Emergency Department (HOSPITAL_COMMUNITY)
Admission: EM | Admit: 2015-01-31 | Discharge: 2015-02-01 | Disposition: A | Discharge status: Home / Self Care | Payer: Medicare HMO | Attending: Emergency Medicine | Admitting: Emergency Medicine

## 2015-01-31 ENCOUNTER — Emergency Department (HOSPITAL_COMMUNITY): Payer: Medicare HMO

## 2015-01-31 ENCOUNTER — Encounter (HOSPITAL_COMMUNITY): Payer: Self-pay | Admitting: Emergency Medicine

## 2015-01-31 DIAGNOSIS — E785 Hyperlipidemia, unspecified: Secondary | ICD-10-CM | POA: Diagnosis not present

## 2015-01-31 DIAGNOSIS — J209 Acute bronchitis, unspecified: Secondary | ICD-10-CM | POA: Insufficient documentation

## 2015-01-31 DIAGNOSIS — Z87891 Personal history of nicotine dependence: Secondary | ICD-10-CM | POA: Insufficient documentation

## 2015-01-31 DIAGNOSIS — R059 Cough, unspecified: Secondary | ICD-10-CM

## 2015-01-31 DIAGNOSIS — Z85040 Personal history of malignant carcinoid tumor of rectum: Secondary | ICD-10-CM | POA: Diagnosis not present

## 2015-01-31 DIAGNOSIS — K219 Gastro-esophageal reflux disease without esophagitis: Secondary | ICD-10-CM | POA: Insufficient documentation

## 2015-01-31 DIAGNOSIS — Z79899 Other long term (current) drug therapy: Secondary | ICD-10-CM | POA: Diagnosis not present

## 2015-01-31 DIAGNOSIS — R05 Cough: Secondary | ICD-10-CM

## 2015-01-31 DIAGNOSIS — Z7982 Long term (current) use of aspirin: Secondary | ICD-10-CM | POA: Diagnosis not present

## 2015-01-31 DIAGNOSIS — Z7952 Long term (current) use of systemic steroids: Secondary | ICD-10-CM | POA: Diagnosis not present

## 2015-01-31 DIAGNOSIS — R0981 Nasal congestion: Secondary | ICD-10-CM

## 2015-01-31 MED ORDER — IPRATROPIUM-ALBUTEROL 0.5-2.5 (3) MG/3ML IN SOLN
3.0000 mL | Freq: Once | RESPIRATORY_TRACT | Status: AC
  Administered 2015-01-31: 3 mL via RESPIRATORY_TRACT
  Filled 2015-01-31: qty 3

## 2015-01-31 MED ORDER — ALBUTEROL SULFATE (2.5 MG/3ML) 0.083% IN NEBU
2.5000 mg | INHALATION_SOLUTION | Freq: Once | RESPIRATORY_TRACT | Status: AC
Start: 2015-01-31 — End: 2015-01-31
  Administered 2015-01-31: 2.5 mg via RESPIRATORY_TRACT
  Filled 2015-01-31: qty 3

## 2015-01-31 MED ORDER — PREDNISONE 50 MG PO TABS
60.0000 mg | ORAL_TABLET | Freq: Once | ORAL | Status: AC
  Administered 2015-01-31: 60 mg via ORAL
  Filled 2015-01-31 (×2): qty 1

## 2015-01-31 NOTE — ED Notes (Signed)
Pt c/o nasal congestion and sinus pressure. States at times it feels as though it is dripping down his throat.

## 2015-01-31 NOTE — ED Provider Notes (Signed)
Complains of nasal congestion and cough started 2 weeks ago. Improved after he treated himself with Tylenol cold medicine. Admits to subjective fever several days ago. He states nasal congestion and cough returned again last night. No other associated symptoms. On exam alert no distress HEENT exam nasal congestion neck supple no lymphadenopathy lungs clear to auscultation  Orlie Dakin, MD 01/31/15 2354

## 2015-02-01 MED ORDER — ALBUTEROL SULFATE HFA 108 (90 BASE) MCG/ACT IN AERS
2.0000 | INHALATION_SPRAY | Freq: Once | RESPIRATORY_TRACT | Status: AC
  Administered 2015-02-01: 2 via RESPIRATORY_TRACT
  Filled 2015-02-01: qty 6.7

## 2015-02-01 MED ORDER — PREDNISONE 10 MG PO TABS: ORAL_TABLET | ORAL | Status: DC

## 2015-02-01 NOTE — ED Provider Notes (Signed)
CSN: [id number]     Arrival date & time 01/31/15  2100 History   First MD Initiated Contact with Patient 01/31/15 2226     Chief Complaint  Patient presents with  . Nasal Congestion     (Consider location/radiation/quality/duration/timing/severity/associated sxs/prior Treatment) The history is provided by the patient.   Tim Cunningham is a 64 y.o. male presenting with a 2 week history of nasal congestion with post nasal drip, clear rhinorrhea and increased shortness of breath with wheezing which he states worsens at night.  He reports subjective fevers along with a nonproductive cough and mild sore throat.  Denies chest pain, nausea, vomiting, weakness. Also no complaint of sinus pain or ear ache.   He has taken tylenol cold formula medicine with temporary relief of symptoms, except for the nasal congestion.  Has also used claritin without improvement.     Past Medical History  Diagnosis Date  . Hyperlipidemia   . GERD (gastroesophageal reflux disease)   . 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 Tonsillectomy    . Colonoscopy  11/01/2012    SLF: SML IH, RECTAL CARCINOID, HYPEPRPLASTIC POLYPS(2), Moderate TICS/Small lipoma IN HF  . Esophagogastroduodenoscopy  11/01/2012    WUJ:[id number] ring was found/MILD GASTRITIS/DUODENIIS  . Eus  12/11/2012    RECTAL CARCINOID EXCISED-MARGINS NOT CLEAR   Family History  Problem Relation Age of Onset  . Colon cancer Other     maternal great uncle  . Lung cancer Other     uncles  . Cancer Mother   . Cancer Maternal Uncle    History  Substance Use Topics  . Smoking status: Former Smoker -- 0.50 packs/day for 10 years    Types: Cigarettes    Quit date: 11/01/1981  . Smokeless tobacco: Never Used     Comment: quit 1999  . Alcohol Use: Yes     Comment: 1-2 drinks a month    Review of Systems  Constitutional: Positive for fever. Negative for chills.  HENT: Positive for congestion,  postnasal drip, rhinorrhea and sore throat. Negative for ear pain, sinus pressure, trouble swallowing and voice change.   Eyes: Negative for discharge.  Respiratory: Positive for cough, shortness of breath and wheezing. Negative for stridor.   Cardiovascular: Negative for chest pain.  Gastrointestinal: Negative for nausea and abdominal pain.  Genitourinary: Negative.       Allergies  Review of patient's allergies indicates no known allergies.  Home Medications   Prior to Admission medications   Medication Sig Start Date End Date Taking? Authorizing Provider  aspirin EC 81 MG tablet Take 81 mg by mouth every other day.   Yes Historical Provider, MD  azelastine (ASTELIN) 137 MCG/SPRAY nasal spray Place 1 spray into the nose daily as needed. For allergies.   Yes Historical Provider, MD  loratadine (CLARITIN) 10 MG tablet Take 10 mg by mouth daily as needed. For allergies.   Yes Historical Provider, MD  Multiple Vitamin (MULTIVITAMIN WITH MINERALS) TABS Take 1 tablet by mouth every morning.   Yes Historical Provider, MD  omeprazole (PRILOSEC) 20 MG capsule Take 1 capsule (20 mg total) by mouth every morning. 02/16/14  Yes Mahala Menghini, [name]  pravastatin (PRAVACHOL) 20 MG tablet Take 20 mg by mouth every morning.   Yes Historical Provider, MD  predniSONE (DELTASONE) 10 MG tablet 6, 5, 4, 3, 2 then 1 tablet by mouth daily for 6 days total. 02/01/15   Evalee Jefferson,  [name]   BP 141/76 mmHg  Pulse 88  Temp(Src) 98.1 F (36.7 C) (Oral)  Resp 20  Ht 5\' 8"  (1.727 m)  Wt 185 lb (83.915 kg)  BMI 28.14 kg/m2  SpO2 97% Physical Exam  Constitutional: He is oriented to person, place, and time. He appears well-developed and well-nourished.  HENT:  Head: Normocephalic and atraumatic.  Right Ear: Tympanic membrane and ear canal normal.  Left Ear: Tympanic membrane and ear canal normal.  Nose: Mucosal edema present. No rhinorrhea.  Mouth/Throat: Uvula is midline, oropharynx is clear and moist and  mucous membranes are normal. No oropharyngeal exudate, posterior oropharyngeal edema, posterior oropharyngeal erythema or tonsillar abscesses.  Eyes: Conjunctivae are normal.  Neck: Normal range of motion.  Cardiovascular: Normal rate and normal heart sounds.   Pulmonary/Chest: Effort normal. No stridor. No respiratory distress. He has wheezes. He has no rhonchi. He has no rales.  Expiratory wheezing throughout all lung fields. Prolonged respirations.   Musculoskeletal: Normal range of motion. He exhibits no edema.  No ankle edema.  Lymphadenopathy:    He has no cervical adenopathy.  Neurological: He is alert and oriented to person, place, and time.  Skin: Skin is warm and dry. No rash noted.  Psychiatric: He has a normal mood and affect.    ED Course  Procedures (including critical care time) Labs Review Labs Reviewed - No data to display  Imaging Review Dg Chest 2 View  02/01/2015   CLINICAL DATA:  Nasal congestion and cough for 2 weeks. Subjective fever for several days. History of rectal cancer.  EXAM: CHEST  2 VIEW  COMPARISON:  None.  FINDINGS: Cardiac silhouette is upper limits of normal in size, mediastinal silhouette is nonsuspicious. Mild bronchitic changes without pleural effusion or focal consolidation. No pneumothorax. Soft tissue planes and included osseous structures are nonsuspicious, mild degenerative change of the thoracic spine.  IMPRESSION: Borderline cardiomegaly. Mild bronchitic changes without focal consolidation.   Electronically Signed   By: Elon Alas   On: 02/01/2015 01:10     EKG Interpretation None      MDM   Final diagnoses:  Cough  Acute bronchitis with bronchospasm  Nasal sinus congestion    Pt given albuterol/atrovent neb x 1 with complete resolution of wheezing, felt improved.  Prednisone taper prescribed, first dose given here.  Also given albuterol mdi with spacer. Encouraged trial of otc zyrtec d or allegra d for nasal congestion. Prn  f/u with pcp if sx persist.  Patients labs and/or radiological studies were reviewed and considered during the medical decision making and disposition process.  Results were also discussed with patient.  Pt was seen by Dr. Winfred Leeds prior to dc home.    Evalee Jefferson, [name] 02/01/15 1413  Orlie Dakin, MD 02/03/15 1335

## 2015-02-01 NOTE — Discharge Instructions (Signed)
Acute Bronchitis Bronchitis is inflammation of the airways that extend from the windpipe into the lungs (bronchi). The inflammation often causes mucus to develop. This leads to a cough, which is the most common symptom of bronchitis.  In acute bronchitis, the condition usually develops suddenly and goes away over time, usually in a couple weeks. Smoking, allergies, and asthma can make bronchitis worse. Repeated episodes of bronchitis may cause further lung problems.  CAUSES Acute bronchitis is most often caused by the same virus that causes a cold. The virus can spread from person to person (contagious) through coughing, sneezing, and touching contaminated objects. SIGNS AND SYMPTOMS   Cough.   Fever.   Coughing up mucus.   Body aches.   Chest congestion.   Chills.   Shortness of breath.   Sore throat.  DIAGNOSIS  Acute bronchitis is usually diagnosed through a physical exam. Your health care provider will also ask you questions about your medical history. Tests, such as chest X-rays, are sometimes done to rule out other conditions.  TREATMENT  Acute bronchitis usually goes away in a couple weeks. Oftentimes, no medical treatment is necessary. Medicines are sometimes given for relief of fever or cough. Antibiotic medicines are usually not needed but may be prescribed in certain situations. In some cases, an inhaler may be recommended to help reduce shortness of breath and control the cough. A cool mist vaporizer may also be used to help thin bronchial secretions and make it easier to clear the chest.  HOME CARE INSTRUCTIONS  Get plenty of rest.   Drink enough fluids to keep your urine clear or pale yellow (unless you have a medical condition that requires fluid restriction). Increasing fluids may help thin your respiratory secretions (sputum) and reduce chest congestion, and it will prevent dehydration.   Take medicines only as directed by your health care provider.  If  you were prescribed an antibiotic medicine, finish it all even if you start to feel better.  Avoid smoking and secondhand smoke. Exposure to cigarette smoke or irritating chemicals will make bronchitis worse. If you are a smoker, consider using nicotine gum or skin patches to help control withdrawal symptoms. Quitting smoking will help your lungs heal faster.   Reduce the chances of another bout of acute bronchitis by washing your hands frequently, avoiding people with cold symptoms, and trying not to touch your hands to your mouth, nose, or eyes.   Keep all follow-up visits as directed by your health care provider.  SEEK MEDICAL CARE IF: Your symptoms do not improve after 1 week of treatment.  SEEK IMMEDIATE MEDICAL CARE IF:  You develop an increased fever or chills.   You have chest pain.   You have severe shortness of breath.  You have bloody sputum.   You develop dehydration.  You faint or repeatedly feel like you are going to pass out.  You develop repeated vomiting.  You develop a severe headache. MAKE SURE YOU:   Understand these instructions.  Will watch your condition.  Will get help right away if you are not doing well or get worse. Document Released: 12/14/2004 Document Revised: 03/23/2014 Document Reviewed: 04/29/2013 [facility] Patient Information 2015 [place], [place]. This information is not intended to replace advice given to you by your health care provider. Make sure you discuss any questions you have with your health care provider.   Use the albuterol inhaler 2 puffs every 4 hours if you are wheezing or coughing.  Continue using your claritin for your  nasal drainage and post nasal drip.  I also suggest using a menthol cough lozenges or strong peppermints such as Altoid mints which can help open up your sinus and nasal congestion.  Take your next dose of prednisone tomorrow evening.  Make sure you are drinking plenty of fluids while you are taking claritin.     Get rechecked for any worsened symptoms.

## 2015-02-02 ENCOUNTER — Encounter (HOSPITAL_COMMUNITY): Payer: Medicare HMO

## 2015-02-02 ENCOUNTER — Encounter: Payer: Self-pay | Admitting: Podiatry

## 2015-02-02 DIAGNOSIS — D3A026 Benign carcinoid tumor of the rectum: Secondary | ICD-10-CM

## 2015-02-02 NOTE — Progress Notes (Signed)
REPEAT LABS DRAWN

## 2015-02-03 LAB — CHROMOGRANIN A: Chromogranin A: 11 ng/mL (ref <=15)

## 2015-02-06 LAB — SEROTONIN SERUM: SEROTONIN, SERUM: 140 ng/mL (ref 56–244)

## 2015-02-17 ENCOUNTER — Telehealth: Payer: Self-pay | Admitting: *Deleted

## 2015-02-17 NOTE — Telephone Encounter (Signed)
I left a message for patient to call me tomorrow.  I need to inform him we need to reschedule his surgery from [place] to [place] because [facility] cannot order Integra Graft that is needed.  Patient will need to get a history and physical form signed by his primary care physician.

## 2015-02-19 NOTE — Telephone Encounter (Signed)
"  Calling to find out why his surgery is being changed.  Why does he have to have a physical?"  We have to move surgery because the graft Dr. Jacqualyn Posey wants to use is not available at [facility].  Therefore, we had to move it to [place].  [facility] requires physicals of their patients.  He will need to come by here to pick up the physical and history form to take to his primary care physician.  Surgery is still scheduled for 03/01/2015.  "Okay thank you, he'll come by to pick it up."

## 2015-02-24 ENCOUNTER — Telehealth: Payer: Self-pay | Admitting: *Deleted

## 2015-02-24 NOTE — Telephone Encounter (Signed)
"  Tim Cunningham said he wants to reschedule his surgery to September.  When he was told he would have to have a physical he said he didn't have enough time to get it."  He couldn't get an appointment before then?  "He hasn't called for a physical yet, he said he just wants to postpone it for now."  Okay, have him call me when he would like to reschedule.  "Okay, he'll do that."  I called and canceled the surgery scheduled at [place] on 03/01/2015.

## 2015-02-24 NOTE — Progress Notes (Signed)
Spoke with daughter-he has not had H/P done yet-he called dr to r/s

## 2015-02-25 NOTE — Telephone Encounter (Signed)
I cancelled patient's post-op appointment. JMS

## 2015-03-01 ENCOUNTER — Encounter (HOSPITAL_BASED_OUTPATIENT_CLINIC_OR_DEPARTMENT_OTHER): Admission: RE | Payer: Self-pay | Source: Ambulatory Visit

## 2015-03-01 ENCOUNTER — Ambulatory Visit (HOSPITAL_BASED_OUTPATIENT_CLINIC_OR_DEPARTMENT_OTHER): Admission: RE | Admit: 2015-03-01 | Payer: Medicare HMO | Source: Ambulatory Visit

## 2015-03-01 SURGERY — EXCISION MASS
Anesthesia: Monitor Anesthesia Care | Site: Foot | Laterality: Right

## 2015-03-05 ENCOUNTER — Encounter: Payer: Medicare HMO | Admitting: Podiatry

## 2015-03-10 ENCOUNTER — Emergency Department (HOSPITAL_COMMUNITY): Payer: Medicare HMO

## 2015-03-10 ENCOUNTER — Encounter (HOSPITAL_COMMUNITY): Payer: Self-pay | Admitting: *Deleted

## 2015-03-10 ENCOUNTER — Emergency Department (HOSPITAL_COMMUNITY)
Admission: EM | Admit: 2015-03-10 | Discharge: 2015-03-10 | Disposition: A | Discharge status: Home / Self Care | Payer: Medicare HMO | Attending: Emergency Medicine | Admitting: Emergency Medicine

## 2015-03-10 DIAGNOSIS — Z7982 Long term (current) use of aspirin: Secondary | ICD-10-CM | POA: Diagnosis not present

## 2015-03-10 DIAGNOSIS — R0981 Nasal congestion: Secondary | ICD-10-CM | POA: Diagnosis present

## 2015-03-10 DIAGNOSIS — Z86018 Personal history of other benign neoplasm: Secondary | ICD-10-CM | POA: Diagnosis not present

## 2015-03-10 DIAGNOSIS — J40 Bronchitis, not specified as acute or chronic: Secondary | ICD-10-CM | POA: Diagnosis not present

## 2015-03-10 DIAGNOSIS — Z79899 Other long term (current) drug therapy: Secondary | ICD-10-CM | POA: Insufficient documentation

## 2015-03-10 DIAGNOSIS — K219 Gastro-esophageal reflux disease without esophagitis: Secondary | ICD-10-CM | POA: Diagnosis not present

## 2015-03-10 DIAGNOSIS — E785 Hyperlipidemia, unspecified: Secondary | ICD-10-CM | POA: Insufficient documentation

## 2015-03-10 DIAGNOSIS — Z87891 Personal history of nicotine dependence: Secondary | ICD-10-CM | POA: Insufficient documentation

## 2015-03-10 MED ORDER — ALBUTEROL SULFATE HFA 108 (90 BASE) MCG/ACT IN AERS: 2.0000 | INHALATION_SPRAY | RESPIRATORY_TRACT | Status: DC | PRN

## 2015-03-10 MED ORDER — PREDNISONE 50 MG PO TABS
60.0000 mg | ORAL_TABLET | Freq: Once | ORAL | Status: AC
  Administered 2015-03-10: 60 mg via ORAL
  Filled 2015-03-10 (×2): qty 1

## 2015-03-10 MED ORDER — ALBUTEROL SULFATE HFA 108 (90 BASE) MCG/ACT IN AERS
2.0000 | INHALATION_SPRAY | RESPIRATORY_TRACT | Status: DC | PRN
  Administered 2015-03-10: 2 via RESPIRATORY_TRACT
  Filled 2015-03-10: qty 6.7

## 2015-03-10 MED ORDER — PREDNISONE 20 MG PO TABS: 60.0000 mg | ORAL_TABLET | Freq: Every day | ORAL | Status: DC

## 2015-03-10 NOTE — ED Notes (Signed)
Pt states he has had cough and congestion. Pt states he gets sob when he goes to lay down.

## 2015-03-10 NOTE — ED Provider Notes (Signed)
CSN: [id number]     Arrival date & time 03/10/15  0532 History   First MD Initiated Contact with Patient 03/10/15 [phone]     Chief Complaint  Patient presents with  . Nasal Congestion     (Consider location/radiation/quality/duration/timing/severity/associated sxs/prior Treatment) HPI  This is a 64 year old male with a history of hyperlipidemia, reflux, and remote smoking history who presents with cough and congestion. Patient reports symptoms over the last several days. He states that when he lays flat "I feel rattling in my chest." He reports associated shortness of breath and cough that is productive. Reports chills without documented fevers. Reports rhinorrhea and sore throat. Reports he has a history of bronchitis which was similar in nature. Denies any chest pain or lower extremity swelling.  Past Medical History  Diagnosis Date  . Hyperlipidemia   . GERD (gastroesophageal reflux disease)   . 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 Tonsillectomy    . Colonoscopy  11/01/2012    SLF: SML IH, RECTAL CARCINOID, HYPEPRPLASTIC POLYPS(2), Moderate TICS/Small lipoma IN HF  . Esophagogastroduodenoscopy  11/01/2012    IRW:[id number] ring was found/MILD GASTRITIS/DUODENIIS  . Eus  12/11/2012    RECTAL CARCINOID EXCISED-MARGINS NOT CLEAR   Family History  Problem Relation Age of Onset  . Colon cancer Other     maternal great uncle  . Lung cancer Other     uncles  . Cancer Mother   . Cancer Maternal Uncle    History  Substance Use Topics  . Smoking status: Former Smoker -- 0.50 packs/day for 10 years    Types: Cigarettes    Quit date: 11/01/1981  . Smokeless tobacco: Never Used     Comment: quit 1999  . Alcohol Use: Yes     Comment: 1-2 drinks a month    Review of Systems  Constitutional: Positive for chills. Negative for fever.  HENT: Positive for sore throat.   Respiratory: Positive for cough and shortness of breath. Negative  for chest tightness.   Cardiovascular: Negative for chest pain and leg swelling.  Gastrointestinal: Negative.  Negative for nausea, vomiting and abdominal pain.  Genitourinary: Negative.  Negative for dysuria.  Musculoskeletal: Negative for back pain.  All other systems reviewed and are negative.     Allergies  Review of patient's allergies indicates no known allergies.  Home Medications   Prior to Admission medications   Medication Sig Start Date End Date Taking? Authorizing Provider  aspirin EC 81 MG tablet Take 81 mg by mouth every other day.   Yes Historical Provider, MD  azelastine (ASTELIN) 137 MCG/SPRAY nasal spray Place 1 spray into the nose daily as needed. For allergies.   Yes Historical Provider, MD  loratadine (CLARITIN) 10 MG tablet Take 10 mg by mouth daily as needed. For allergies.   Yes Historical Provider, MD  Multiple Vitamin (MULTIVITAMIN WITH MINERALS) TABS Take 1 tablet by mouth every morning.   Yes Historical Provider, MD  omeprazole (PRILOSEC) 20 MG capsule Take 1 capsule (20 mg total) by mouth every morning. 02/16/14  Yes Mahala Menghini, [name]  pravastatin (PRAVACHOL) 20 MG tablet Take 20 mg by mouth every morning.   Yes Historical Provider, MD  albuterol (PROVENTIL HFA;VENTOLIN HFA) 108 (90 BASE) MCG/ACT inhaler Inhale 2 puffs into the lungs every 4 (four) hours as needed for wheezing or shortness of breath. 03/10/15   Merryl Hacker, MD  predniSONE (DELTASONE) 20 MG tablet Take 3 tablets (  60 mg total) by mouth daily with breakfast. 03/10/15   Merryl Hacker, MD   BP 153/82 mmHg  Pulse 58  Temp(Src) 97.6 F (36.4 C) (Oral)  Resp 18  Ht 5\' 8"  (1.727 m)  Wt 185 lb (83.915 kg)  BMI 28.14 kg/m2  SpO2 96% Physical Exam  Constitutional: He is oriented to person, place, and time. He appears well-developed and well-nourished. No distress.  HENT:  Head: Normocephalic and atraumatic.  Mouth/Throat: Oropharynx is clear and moist. No oropharyngeal exudate.   Eyes: Pupils are equal, round, and reactive to light.  Neck: Neck supple.  Cardiovascular: Normal rate, regular rhythm and normal heart sounds.   No murmur heard. Pulmonary/Chest: Effort normal. No respiratory distress. He has wheezes.  Diffuse expiratory wheezing, fair air movement  Abdominal: Soft. Bowel sounds are normal. There is no tenderness. There is no rebound.  Musculoskeletal: He exhibits no edema.  Lymphadenopathy:    He has no cervical adenopathy.  Neurological: He is alert and oriented to person, place, and time.  Skin: Skin is warm and dry.  Psychiatric: He has a normal mood and affect.  Nursing note and vitals reviewed.   ED Course  Procedures (including critical care time) Labs Review Labs Reviewed - No data to display  Imaging Review Dg Chest 2 View  03/10/2015   CLINICAL DATA:  Cough, history of bronchitis.  EXAM: CHEST  2 VIEW  COMPARISON:  02/01/2015  FINDINGS: Heart size upper normal. Calcified right hilar nodes. Mild central peribronchial thickening. No confluent airspace opacity, pleural effusion, pneumothorax. No acute osseous finding.  IMPRESSION: Mild bronchitic change may be acute and/or chronic. No focal consolidation.   Electronically Signed   By: Carlos Levering M.D.   On: 03/10/2015 06:34     EKG Interpretation None      MDM   Final diagnoses:  Bronchitis    Patient presents with shortness of breath and cough. Nontoxic on exam. Satting 96% on room air and no acute respiratory distress noted. Diffuse expiratory wheezing on exam. History of smoking. Patient given prednisone and albuterol inhaler. Chest x-ray shows no evidence of acute pneumonia. Suspect acute bronchitis. No evidence of tonsillar exudate or other symptoms concerning for acute strep pharyngitis. On recheck, patient reports improvement following HFA inhaler. Repeat exam improved. Will discharge home with prednisone and inhaler. Patient to follow-up with primary physician if symptoms  persist.  After history, exam, and medical workup I feel the patient has been appropriately medically screened and is safe for discharge home. Pertinent diagnoses were discussed with the patient. Patient was given return precautions.     Merryl Hacker, MD 03/10/15 [phone]

## 2015-03-10 NOTE — ED Notes (Signed)
Pt alert & oriented x4, stable gait. Patient given discharge instructions, paperwork & prescription(s). Patient  instructed to stop at the registration desk to finish any additional paperwork. Patient verbalized understanding. Pt left department w/ no further questions. 

## 2015-03-10 NOTE — Discharge Instructions (Signed)

## 2015-03-13 ENCOUNTER — Encounter (HOSPITAL_COMMUNITY): Payer: Self-pay | Admitting: Emergency Medicine

## 2015-03-13 ENCOUNTER — Emergency Department (HOSPITAL_COMMUNITY)
Admission: EM | Admit: 2015-03-13 | Discharge: 2015-03-13 | Disposition: A | Discharge status: Home / Self Care | Payer: Medicare HMO | Source: Home / Self Care | Attending: Emergency Medicine | Admitting: Emergency Medicine

## 2015-03-13 DIAGNOSIS — Z7982 Long term (current) use of aspirin: Secondary | ICD-10-CM | POA: Insufficient documentation

## 2015-03-13 DIAGNOSIS — K625 Hemorrhage of anus and rectum: Secondary | ICD-10-CM

## 2015-03-13 DIAGNOSIS — K921 Melena: Secondary | ICD-10-CM | POA: Diagnosis not present

## 2015-03-13 DIAGNOSIS — D72829 Elevated white blood cell count, unspecified: Secondary | ICD-10-CM | POA: Diagnosis present

## 2015-03-13 DIAGNOSIS — E785 Hyperlipidemia, unspecified: Secondary | ICD-10-CM | POA: Insufficient documentation

## 2015-03-13 DIAGNOSIS — Z85040 Personal history of malignant carcinoid tumor of rectum: Secondary | ICD-10-CM

## 2015-03-13 DIAGNOSIS — K219 Gastro-esophageal reflux disease without esophagitis: Secondary | ICD-10-CM | POA: Insufficient documentation

## 2015-03-13 DIAGNOSIS — K648 Other hemorrhoids: Secondary | ICD-10-CM

## 2015-03-13 DIAGNOSIS — Z87891 Personal history of nicotine dependence: Secondary | ICD-10-CM

## 2015-03-13 DIAGNOSIS — D62 Acute posthemorrhagic anemia: Secondary | ICD-10-CM | POA: Diagnosis present

## 2015-03-13 DIAGNOSIS — Z79899 Other long term (current) drug therapy: Secondary | ICD-10-CM | POA: Insufficient documentation

## 2015-03-13 DIAGNOSIS — Z801 Family history of malignant neoplasm of trachea, bronchus and lung: Secondary | ICD-10-CM

## 2015-03-13 DIAGNOSIS — K5731 Diverticulosis of large intestine without perforation or abscess with bleeding: Principal | ICD-10-CM | POA: Diagnosis present

## 2015-03-13 DIAGNOSIS — E876 Hypokalemia: Secondary | ICD-10-CM | POA: Diagnosis present

## 2015-03-13 DIAGNOSIS — Z800 Family history of malignant neoplasm of digestive organs: Secondary | ICD-10-CM

## 2015-03-13 LAB — CBC
HCT: 34.7 % — ABNORMAL LOW (ref 39.0–52.0)
Hemoglobin: 11.9 g/dL — ABNORMAL LOW (ref 13.0–17.0)
MCH: 30.3 pg (ref 26.0–34.0)
MCHC: 34.3 g/dL (ref 30.0–36.0)
MCV: 88.3 fL (ref 78.0–100.0)
PLATELETS: 198 10*3/uL (ref 150–400)
RBC: 3.93 MIL/uL — ABNORMAL LOW (ref 4.22–5.81)
RDW: 12.7 % (ref 11.5–15.5)
WBC: 10.7 10*3/uL — ABNORMAL HIGH (ref 4.0–10.5)

## 2015-03-13 LAB — COMPREHENSIVE METABOLIC PANEL
ALBUMIN: 4 g/dL (ref 3.5–5.2)
ALT: 57 U/L — ABNORMAL HIGH (ref 0–53)
AST: 48 U/L — AB (ref 0–37)
Alkaline Phosphatase: 68 U/L (ref 39–117)
Anion gap: 8 (ref 5–15)
BILIRUBIN TOTAL: 0.7 mg/dL (ref 0.3–1.2)
BUN: 19 mg/dL (ref 6–23)
CO2: 25 mmol/L (ref 19–32)
Calcium: 8.9 mg/dL (ref 8.4–10.5)
Chloride: 105 mmol/L (ref 96–112)
Creatinine, Ser: 0.97 mg/dL (ref 0.50–1.35)
GFR calc Af Amer: >90 mL/min (ref >90)
GFR calc non Af Amer: 85 mL/min — ABNORMAL LOW (ref >90)
Glucose, Bld: 162 mg/dL — ABNORMAL HIGH (ref 70–99)
Potassium: 4.1 mmol/L (ref 3.5–5.1)
Sodium: 138 mmol/L (ref 135–145)
TOTAL PROTEIN: 6.8 g/dL (ref 6.0–8.3)

## 2015-03-13 LAB — POC OCCULT BLOOD, ED: FECAL OCCULT BLD: POSITIVE — AB

## 2015-03-13 MED ORDER — HYDROCORTISONE ACETATE 25 MG RE SUPP: 25.0000 mg | Freq: Two times a day (BID) | RECTAL | Status: DC

## 2015-03-13 NOTE — Discharge Instructions (Signed)
You are being treated with suppositories for a presumed internal hemorrhoid. Bleeding from a hemorrhoid should slowly improve. If your bleeding worsens at ANY time, persists MORE THAN 24 HOURS, or if you develop pain, lightheadedness, weakness, dizziness, please return to emergency room. Call Dr. Oneida Alar on Monday for a recheck appointment.   Bloody Stools Bloody stools often mean that there is a problem in the digestive tract. Your caregiver may use the term "melena" to describe black, tarry, and bad smelling stools or "hematochezia" to describe red or maroon-colored stools. Blood seen in the stool can be caused by bleeding anywhere along the intestinal tract.  A black stool usually means that blood is coming from the upper part of the gastrointestinal tract (esophagus, stomach, or small bowel). Passing maroon-colored stools or bright red blood usually means that blood is coming from lower down in the large bowel or the rectum. However, sometimes massive bleeding in the stomach or small intestine can cause bright red bloody stools.  Consuming black licorice, lead, iron pills, medicines containing bismuth subsalicylate, or blueberries can also cause black stools. Your caregiver can test black stools to see if blood is present. It is important that the cause of the bleeding be found. Treatment can then be started, and the problem can be corrected. Rectal bleeding may not be serious, but you should not assume everything is okay until you know the cause.It is very important to follow up with your caregiver or a specialist in gastrointestinal problems. CAUSES  Blood in the stools can come from various underlying causes.Often, the cause is not found during your first visit. Testing is often needed to discover the cause of bleeding in the gastrointestinal tract. Causes range from simple to serious or even life-threatening.Possible causes include:  Hemorrhoids.These are veins that are full of blood  (engorged) in the rectum. They cause pain, inflammation, and may bleed.  Anal fissures.These are areas of painful tearing which may bleed. They are often caused by passing hard stool.  Diverticulosis.These are pouches that form on the colon over time, with age, and may bleed significantly.  Diverticulitis.This is inflammation in areas with diverticulosis. It can cause pain, fever, and bloody stools, although bleeding is rare.  Proctitis and colitis. These are inflamed areas of the rectum or colon. They may cause pain, fever, and bloody stools.  Polyps and cancer. Colon cancer is a leading cause of preventable cancer death.It often starts out as precancerous polyps that can be removed during a colonoscopy, preventing progression into cancer. Sometimes, polyps and cancer may cause rectal bleeding.  Gastritis and ulcers.Bleeding from the upper gastrointestinal tract (near the stomach) may travel through the intestines and produce black, sometimes tarry, often bad smelling stools. In certain cases, if the bleeding is fast enough, the stools may not be black, but red and the condition may be life-threatening. SYMPTOMS  You may have stools that are bright red and bloody, that are normal color with blood on them, or that are dark black and tarry. In some cases, you may only have blood in the toilet bowl. Any of these cases need medical care. You may also have:  Pain at the anus or anywhere in the rectum.  Lightheadedness or feeling faint.  Extreme weakness.  Nausea or vomiting.  Fever. DIAGNOSIS Your caregiver may use the following methods to find the cause of your bleeding:  Taking a medical history. Age is important. Older people tend to develop polyps and cancer more often. If there is anal pain and  a hard, large stool associated with bleeding, a tear of the anus may be the cause. If blood drips into the toilet after a bowel movement, bleeding hemorrhoids may be the problem. The color  and frequency of the bleeding are additional considerations. In most cases, the medical history provides clues, but seldom the final answer.  A visual and finger (digital) exam. Your caregiver will inspect the anal area, looking for tears and hemorrhoids. A finger exam can provide information when there is tenderness or a growth inside. In men, the prostate is also examined.  Endoscopy. Several types of small, long scopes (endoscopes) are used to view the colon.  In the office, your caregiver may use a rigid, or more commonly, a flexible viewing sigmoidoscope. This exam is called flexible sigmoidoscopy. It is performed in 5 to 10 minutes.  A more thorough exam is accomplished with a colonoscope. It allows your caregiver to view the entire 5 to 6 foot long colon. Medicine to help you relax (sedative) is usually given for this exam. Frequently, a bleeding lesion may be present beyond the reach of the sigmoidoscope. So, a colonoscopy may be the best exam to start with. Both exams are usually done on an outpatient basis. This means the patient does not stay overnight in the hospital or surgery center.  An upper endoscopy may be needed to examine your stomach. Sedation is used and a flexible endoscope is put in your mouth, down to your stomach.  A barium enema X-ray. This is an X-ray exam. It uses liquid barium inserted by enema into the rectum. This test alone may not identify an actual bleeding point. X-rays highlight abnormal shadows, such as those made by lumps (tumors), diverticuli, or colitis. TREATMENT  Treatment depends on the cause of your bleeding.   For bleeding from the stomach or colon, the caregiver doing your endoscopy or colonoscopy may be able to stop the bleeding as part of the procedure.  Inflammation or infection of the colon can be treated with medicines.  Many rectal problems can be treated with creams, suppositories, or warm baths.  Surgery is sometimes needed.  Blood  transfusions are sometimes needed if you have lost a lot of blood.  For any bleeding problem, let your caregiver know if you take aspirin or other blood thinners regularly. HOME CARE INSTRUCTIONS   Take any medicines exactly as prescribed.  Keep your stools soft by eating a diet high in fiber. Prunes (1 to 3 a day) work well for many people.  Drink enough water and fluids to keep your urine clear or pale yellow.  Take sitz baths if advised. A sitz bath is when you sit in a bathtub with warm water for 10 to 15 minutes to soak, soothe, and cleanse the rectal area.  If enemas or suppositories are advised, be sure you know how to use them. Tell your caregiver if you have problems with this.  Monitor your bowel movements to look for signs of improvement or worsening. SEEK MEDICAL CARE IF:   You do not improve in the time expected.  Your condition worsens after initial improvement.  You develop any new symptoms. SEEK IMMEDIATE MEDICAL CARE IF:   You develop severe or prolonged rectal bleeding.  You vomit blood.  You feel weak or faint.  You have a fever. MAKE SURE YOU:  Understand these instructions.  Will watch your condition.  Will get help right away if you are not doing well or get worse. Document Released: 10/27/2002  Document Revised: 01/29/2012 Document Reviewed: 03/24/2011 [facility] Patient Information 2015 [place], [place]. This information is not intended to replace advice given to you by your health care provider. Make sure you discuss any questions you have with your health care provider.  Hemorrhoids Hemorrhoids are swollen veins around the rectum or anus. There are two types of hemorrhoids:   Internal hemorrhoids. These occur in the veins just inside the rectum. They may poke through to the outside and become irritated and painful.  External hemorrhoids. These occur in the veins outside the anus and can be felt as a painful swelling or hard lump near the  anus. CAUSES  Pregnancy.   Obesity.   Constipation or diarrhea.   Straining to have a bowel movement.   Sitting for long periods on the toilet.  Heavy lifting or other activity that caused you to strain.  Anal intercourse. SYMPTOMS   Pain.   Anal itching or irritation.   Rectal bleeding.   Fecal leakage.   Anal swelling.   One or more lumps around the anus.  DIAGNOSIS  Your caregiver may be able to diagnose hemorrhoids by visual examination. Other examinations or tests that may be performed include:   Examination of the rectal area with a gloved hand (digital rectal exam).   Examination of anal canal using a small tube (scope).   A blood test if you have lost a significant amount of blood.  A test to look inside the colon (sigmoidoscopy or colonoscopy). TREATMENT Most hemorrhoids can be treated at home. However, if symptoms do not seem to be getting better or if you have a lot of rectal bleeding, your caregiver may perform a procedure to help make the hemorrhoids get smaller or remove them completely. Possible treatments include:   Placing a rubber band at the base of the hemorrhoid to cut off the circulation (rubber band ligation).   Injecting a chemical to shrink the hemorrhoid (sclerotherapy).   Using a tool to burn the hemorrhoid (infrared light therapy).   Surgically removing the hemorrhoid (hemorrhoidectomy).   Stapling the hemorrhoid to block blood flow to the tissue (hemorrhoid stapling).  HOME CARE INSTRUCTIONS   Eat foods with fiber, such as whole grains, beans, nuts, fruits, and vegetables. Ask your doctor about taking products with added fiber in them (fibersupplements).  Increase fluid intake. Drink enough water and fluids to keep your urine clear or pale yellow.   Exercise regularly.   Go to the bathroom when you have the urge to have a bowel movement. Do not wait.   Avoid straining to have bowel movements.   Keep the  anal area dry and clean. Use wet toilet paper or moist towelettes after a bowel movement.   Medicated creams and suppositories may be used or applied as directed.   Only take over-the-counter or prescription medicines as directed by your caregiver.   Take warm sitz baths for 15-20 minutes, 3-4 times a day to ease pain and discomfort.   Place ice packs on the hemorrhoids if they are tender and swollen. Using ice packs between sitz baths may be helpful.   Put ice in a plastic bag.   Place a towel between your skin and the bag.   Leave the ice on for 15-20 minutes, 3-4 times a day.   Do not use a donut-shaped pillow or sit on the toilet for long periods. This increases blood pooling and pain.  SEEK MEDICAL CARE IF:  You have increasing pain and swelling that is  not controlled by treatment or medicine.  You have uncontrolled bleeding.  You have difficulty or you are unable to have a bowel movement.  You have pain or inflammation outside the area of the hemorrhoids. MAKE SURE YOU:  Understand these instructions.  Will watch your condition.  Will get help right away if you are not doing well or get worse. Document Released: 11/03/2000 Document Revised: 10/23/2012 Document Reviewed: 09/10/2012 [facility] Patient Information 2015 [place], [place]. This information is not intended to replace advice given to you by your health care provider. Make sure you discuss any questions you have with your health care provider.

## 2015-03-13 NOTE — ED Provider Notes (Signed)
CSN: [id number]     Arrival date & time 03/13/15  1435 History   First MD Initiated Contact with Patient 03/13/15 1516     Chief Complaint  Patient presents with  . Rectal Bleeding      HPI  Patient presents for evaluation after seeing some blood in his stool today.  Patient states he was seen several days ago with a bronchitis. He was given 3 days of prednisone. He took his last dose this morning. He states that it seems upset his stomach. He had an episode of diarrhea yesterday. He had 3 episodes today. He does not have pain. He does not have cramps. He does not have epigastric pain or burning. On his second and third episodes of diarrhea today, he noticed there was some blood in the toilet.  He states that the amount of blood was minimal and was mostly "loose stool". No mucus. Has not had fever or felt ill. No anterior abdominal pain.  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 Original biopsy by Dr. Oneida Alar in 2014.  Had follow-up rectal ultrasound with Dr. Paulita Fujita that showed no clear margins. Repeat ultrasound in January, 2016 showed no sign of recurrence. Colonoscopy December 2015 showed diverticuli, internal hemorrhoid, benign lipomas, and no sign of recurrence of rectal carcinoid polyps.    Past Medical History  Diagnosis Date  . Hyperlipidemia   . GERD (gastroesophageal reflux disease)   . 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 Tonsillectomy    . Colonoscopy  11/01/2012    Dr. Oneida Alar: SML IH, RECTAL CARCINOID, HYPEPRPLASTIC POLYPS(2), Moderate TICS/Small lipoma IN HF  . Esophagogastroduodenoscopy  11/01/2012    Dr. Fields:Schatzki ring was found/MILD GASTRITIS/DUODENIIS  . Eus  12/11/2012    RECTAL CARCINOID EXCISED-MARGINS NOT CLEAR  . Flexible sigmoidoscopy  Jan 2016    WITH EUS. Dr. Paulita Fujita. NO residual lesion identified  . Colonoscopy N/A 03/15/2015    Procedure: COLONOSCOPY;  Surgeon: Daneil Dolin, MD;   Location: [facility];  Service: Endoscopy;  Laterality: N/A;   Family History  Problem Relation Age of Onset  . Colon cancer Other     maternal great uncle  . Lung cancer Other     uncles  . Cancer Mother   . Cancer Maternal Uncle    History  Substance Use Topics  . Smoking status: Former Smoker -- 0.50 packs/day for 10 years    Types: Cigarettes    Quit date: 11/01/1981  . Smokeless tobacco: Never Used     Comment: quit 1999  . Alcohol Use: Yes     Comment: 1-2 drinks a month    Review of Systems  Constitutional: Negative for fever, chills, diaphoresis, appetite change and fatigue.  HENT: Negative for mouth sores, sore throat and trouble swallowing.   Eyes: Negative for visual disturbance.  Respiratory: Negative for cough, chest tightness, shortness of breath and wheezing.   Cardiovascular: Negative for chest pain.  Gastrointestinal: Positive for diarrhea and blood in stool. Negative for nausea, vomiting, abdominal pain and abdominal distention.  Endocrine: Negative for polydipsia, polyphagia and polyuria.  Genitourinary: Negative for dysuria, frequency and hematuria.  Musculoskeletal: Negative for gait problem.  Skin: Negative for color change, pallor and rash.  Neurological: Negative for dizziness, syncope, light-headedness and headaches.  Hematological: Does not bruise/bleed easily.  Psychiatric/Behavioral: Negative for behavioral problems and confusion.      Allergies  Review of patient's allergies indicates  no known allergies.  Home Medications   Prior to Admission medications   Medication Sig Start Date End Date Taking? Authorizing Provider  albuterol (PROVENTIL HFA;VENTOLIN HFA) 108 (90 BASE) MCG/ACT inhaler Inhale 2 puffs into the lungs every 4 (four) hours as needed for wheezing or shortness of breath. 03/10/15  Yes Merryl Hacker, MD  azelastine (ASTELIN) 137 MCG/SPRAY nasal spray Place 1 spray into the nose daily as needed. For allergies.   Yes  Historical Provider, MD  loratadine (CLARITIN) 10 MG tablet Take 10 mg by mouth daily as needed. For allergies.   Yes Historical Provider, MD  Multiple Vitamin (MULTIVITAMIN WITH MINERALS) TABS Take 1 tablet by mouth every morning.   Yes Historical Provider, MD  omeprazole (PRILOSEC) 20 MG capsule Take 1 capsule (20 mg total) by mouth every morning. 02/16/14  Yes Mahala Menghini, [name]  pravastatin (PRAVACHOL) 20 MG tablet Take 20 mg by mouth at bedtime.    Yes Historical Provider, MD  aspirin EC 81 MG tablet Take 1 tablet (81 mg total) by mouth every other day. Resume in 1 week 03/16/15   Kathie Dike, MD  hydrocortisone (ANUSOL-HC) 25 MG suppository Place 1 suppository (25 mg total) rectally 2 (two) times daily. 03/13/15   Tanna Furry, MD   BP 139/82 mmHg  Pulse 72  Temp(Src) 97.8 F (36.6 C) (Oral)  Resp 18  Ht 5\' 8"  (1.727 m)  Wt 185 lb (83.915 kg)  BMI 28.14 kg/m2  SpO2 98% Physical Exam  Constitutional: He is oriented to person, place, and time. He appears well-developed and well-nourished. No distress.  HENT:  Head: Normocephalic.  Eyes: Conjunctivae are normal. Pupils are equal, round, and reactive to light. No scleral icterus.  Neck: Normal range of motion. Neck supple. No thyromegaly present.  Cardiovascular: Normal rate and regular rhythm.  Exam reveals no gallop and no friction rub.   No murmur heard. Pulmonary/Chest: Effort normal and breath sounds normal. No respiratory distress. He has no wheezes. He has no rales.  Abdominal: Soft. Bowel sounds are normal. He exhibits no distension. There is no tenderness. There is no rebound.  Genitourinary:     Musculoskeletal: Normal range of motion.  Neurological: He is alert and oriented to person, place, and time.  Skin: Skin is warm and dry. No rash noted.  Psychiatric: He has a normal mood and affect. His behavior is normal.    ED Course  Procedures (including critical care time) Labs Review Labs Reviewed  CBC - Abnormal;  Notable for the following:    WBC 10.7 (*)    RBC 3.93 (*)    Hemoglobin 11.9 (*)    HCT 34.7 (*)    All other components within normal limits  COMPREHENSIVE METABOLIC PANEL - Abnormal; Notable for the following:    Glucose, Bld 162 (*)    AST 48 (*)    ALT 57 (*)    GFR calc non Af Amer 85 (*)    All other components within normal limits  POC OCCULT BLOOD, ED - Abnormal; Notable for the following:    Fecal Occult Bld POSITIVE (*)    All other components within normal limits    Imaging Review No results found.   EKG Interpretation None      MDM   Final diagnoses:  Rectal bleeding  Internal hemorrhoid     Patient with 2 polyps removed 2013. Rectal polyp found to be carcinoid. Underwent excision. Underwent rectal ultrasound by Dr. Paulita Fujita, with repeat rectal ultrasound  performed January of this year. No sign of recurrence.    Most recent colonoscopy, by Dr. Oneida Alar, Dec 2016.  Shows colonic diverticuli, 2 colonic lipomas that were biopsied normal. And an internal hemorrhoid that was inflamed.   On exam today, Small, non painful anal fissure, not currently bleeding.  Have her, patient's hemoglobin 11.9 which is down from his baseline of 13. Normocytic. 88.3.  Bleeding episode today came after 3 episodes of diarrheal stools. Etiology could be diverticulitis, internal hemorrhoid, anal fissure. He is stable. He does not have coronary vascular disease. I think he would be appropriate for close outpatient treatment. Anus all suppositories. Follow-up on Monday for recheck hemoglobin. If bloody stools persist or worsen, have asked him to recheck here. Recheck here with any additional symptoms dizziness lightheadedness abdominal pain fever or other changes.      Tanna Furry, MD 03/17/15 [phone]

## 2015-03-13 NOTE — ED Notes (Addendum)
Pt reports recently prescribed prednisone. Pt reports after taking three prednisone this am, pt reports abdominal cramping and "bright red" blood in toilet. Pt reports last normal BM several days ago. Pt denies n/v,dizziness.

## 2015-03-14 ENCOUNTER — Inpatient Hospital Stay (HOSPITAL_COMMUNITY)
Admission: EM | Admit: 2015-03-14 | Discharge: 2015-03-16 | DRG: 330 | Disposition: A | Discharge status: Home / Self Care | Payer: Medicare HMO | Attending: Internal Medicine | Admitting: Internal Medicine

## 2015-03-14 ENCOUNTER — Emergency Department (HOSPITAL_COMMUNITY): Payer: Medicare HMO

## 2015-03-14 ENCOUNTER — Encounter (HOSPITAL_COMMUNITY): Payer: Self-pay | Admitting: *Deleted

## 2015-03-14 DIAGNOSIS — D72829 Elevated white blood cell count, unspecified: Secondary | ICD-10-CM | POA: Diagnosis present

## 2015-03-14 DIAGNOSIS — Z87891 Personal history of nicotine dependence: Secondary | ICD-10-CM | POA: Diagnosis not present

## 2015-03-14 DIAGNOSIS — Z85040 Personal history of malignant carcinoid tumor of rectum: Secondary | ICD-10-CM | POA: Diagnosis not present

## 2015-03-14 DIAGNOSIS — D62 Acute posthemorrhagic anemia: Secondary | ICD-10-CM | POA: Diagnosis present

## 2015-03-14 DIAGNOSIS — K5731 Diverticulosis of large intestine without perforation or abscess with bleeding: Secondary | ICD-10-CM | POA: Diagnosis present

## 2015-03-14 DIAGNOSIS — E785 Hyperlipidemia, unspecified: Secondary | ICD-10-CM | POA: Insufficient documentation

## 2015-03-14 DIAGNOSIS — K219 Gastro-esophageal reflux disease without esophagitis: Secondary | ICD-10-CM | POA: Diagnosis present

## 2015-03-14 DIAGNOSIS — K922 Gastrointestinal hemorrhage, unspecified: Secondary | ICD-10-CM | POA: Diagnosis not present

## 2015-03-14 DIAGNOSIS — Z800 Family history of malignant neoplasm of digestive organs: Secondary | ICD-10-CM | POA: Diagnosis not present

## 2015-03-14 DIAGNOSIS — Z7982 Long term (current) use of aspirin: Secondary | ICD-10-CM | POA: Diagnosis not present

## 2015-03-14 DIAGNOSIS — K921 Melena: Secondary | ICD-10-CM | POA: Diagnosis present

## 2015-03-14 DIAGNOSIS — K648 Other hemorrhoids: Secondary | ICD-10-CM | POA: Diagnosis present

## 2015-03-14 DIAGNOSIS — E876 Hypokalemia: Secondary | ICD-10-CM | POA: Diagnosis present

## 2015-03-14 DIAGNOSIS — Z801 Family history of malignant neoplasm of trachea, bronchus and lung: Secondary | ICD-10-CM | POA: Diagnosis not present

## 2015-03-14 LAB — PROTIME-INR
INR: 1.01 (ref 0.00–1.49)
Prothrombin Time: 13.4 seconds (ref 11.6–15.2)

## 2015-03-14 LAB — COMPREHENSIVE METABOLIC PANEL
ALK PHOS: 53 U/L (ref 39–117)
ALT: 45 U/L (ref 0–53)
AST: 30 U/L (ref 0–37)
Albumin: 3.3 g/dL — ABNORMAL LOW (ref 3.5–5.2)
Anion gap: 6 (ref 5–15)
BUN: 20 mg/dL (ref 6–23)
CO2: 28 mmol/L (ref 19–32)
Calcium: 8.2 mg/dL — ABNORMAL LOW (ref 8.4–10.5)
Chloride: 103 mmol/L (ref 96–112)
Creatinine, Ser: 0.89 mg/dL (ref 0.50–1.35)
GFR calc Af Amer: >90 mL/min (ref >90)
GFR calc non Af Amer: 88 mL/min — ABNORMAL LOW (ref >90)
Glucose, Bld: 117 mg/dL — ABNORMAL HIGH (ref 70–99)
POTASSIUM: 3.4 mmol/L — AB (ref 3.5–5.1)
Sodium: 137 mmol/L (ref 135–145)
Total Bilirubin: 0.3 mg/dL (ref 0.3–1.2)
Total Protein: 5.3 g/dL — ABNORMAL LOW (ref 6.0–8.3)

## 2015-03-14 LAB — CBC WITH DIFFERENTIAL/PLATELET
Basophils Absolute: 0 10*3/uL (ref 0.0–0.1)
Basophils Relative: 0 % (ref 0–1)
EOS ABS: 0.1 10*3/uL (ref 0.0–0.7)
EOS PCT: 1 % (ref 0–5)
HCT: 21.9 % — ABNORMAL LOW (ref 39.0–52.0)
Hemoglobin: 7.7 g/dL — ABNORMAL LOW (ref 13.0–17.0)
Lymphocytes Relative: 35 % (ref 12–46)
Lymphs Abs: 4 10*3/uL (ref 0.7–4.0)
MCH: 31.2 pg (ref 26.0–34.0)
MCHC: 35.2 g/dL (ref 30.0–36.0)
MCV: 88.7 fL (ref 78.0–100.0)
MONOS PCT: 6 % (ref 3–12)
Monocytes Absolute: 0.7 10*3/uL (ref 0.1–1.0)
Neutro Abs: 6.8 10*3/uL (ref 1.7–7.7)
Neutrophils Relative %: 58 % (ref 43–77)
PLATELETS: 159 10*3/uL (ref 150–400)
RBC: 2.47 MIL/uL — ABNORMAL LOW (ref 4.22–5.81)
RDW: 12.8 % (ref 11.5–15.5)
WBC: 11.7 10*3/uL — AB (ref 4.0–10.5)

## 2015-03-14 LAB — LIPASE, BLOOD: Lipase: 20 U/L (ref 11–59)

## 2015-03-14 MED ORDER — POTASSIUM CHLORIDE IN NACL 20-0.9 MEQ/L-% IV SOLN
INTRAVENOUS | Status: DC
  Administered 2015-03-15: 1 mL via INTRAVENOUS
  Administered 2015-03-15: 08:00:00 via INTRAVENOUS
  Administered 2015-03-16: 1 mL via INTRAVENOUS

## 2015-03-14 MED ORDER — SODIUM CHLORIDE 0.9 % IV SOLN
80.0000 mg | INTRAVENOUS | Status: DC
  Administered 2015-03-15 (×2): 80 mg via INTRAVENOUS
  Filled 2015-03-14 (×3): qty 80

## 2015-03-14 MED ORDER — SODIUM CHLORIDE 0.9 % IV SOLN: 10.0000 mL/h | Freq: Once | INTRAVENOUS | Status: DC

## 2015-03-14 MED ORDER — SODIUM CHLORIDE 0.9 % IV SOLN
80.0000 mg | Freq: Once | INTRAVENOUS | Status: AC
  Administered 2015-03-14: 80 mg via INTRAVENOUS
  Filled 2015-03-14: qty 80

## 2015-03-14 MED ORDER — PANTOPRAZOLE SODIUM 40 MG IV SOLR
INTRAVENOUS | Status: AC
  Filled 2015-03-14: qty 80

## 2015-03-14 NOTE — ED Provider Notes (Addendum)
CSN: [id number]     Arrival date & time 03/14/15  2050 History   First MD Initiated Contact with Patient 03/14/15 2201     No chief complaint on file.    (Consider location/radiation/quality/duration/timing/severity/associated sxs/prior Treatment) HPI Is a 64 year old male who presents today with rectal bleeding for the past 2 days. He describes some epigastric discomfort. He describes multiple episodes of bright red blood per rectum. He was seen and evaluated yesterday at that time appeared to be hemodynamically stable and was not actively bleeding. He was sent home to follow-up as an outpatient. He has a known history of a carcinoid tumor of the rectum. He presents today stating that he has had multiple today. He denies feeling weak or lightheaded. Past Medical History  Diagnosis Date  . Hyperlipidemia   . GERD (gastroesophageal reflux disease)   . 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 Tonsillectomy    . Colonoscopy  11/01/2012    SLF: SML IH, RECTAL CARCINOID, HYPEPRPLASTIC POLYPS(2), Moderate TICS/Small lipoma IN HF  . Esophagogastroduodenoscopy  11/01/2012    [id number] ring was found/MILD GASTRITIS/DUODENIIS  . Eus  12/11/2012    RECTAL CARCINOID EXCISED-MARGINS NOT CLEAR   Family History  Problem Relation Age of Onset  . Colon cancer Other     maternal great uncle  . Lung cancer Other     uncles  . Cancer Mother   . Cancer Maternal Uncle    History  Substance Use Topics  . Smoking status: Former Smoker -- 0.50 packs/day for 10 years    Types: Cigarettes    Quit date: 11/01/1981  . Smokeless tobacco: Never Used     Comment: quit 1999  . Alcohol Use: Yes     Comment: 1-2 drinks a month    Review of Systems  All other systems reviewed and are negative.     Allergies  Review of patient's allergies indicates no known allergies.  Home Medications   Prior to Admission medications   Medication Sig Start Date  End Date Taking? Authorizing Provider  albuterol (PROVENTIL HFA;VENTOLIN HFA) 108 (90 BASE) MCG/ACT inhaler Inhale 2 puffs into the lungs every 4 (four) hours as needed for wheezing or shortness of breath. 03/10/15  Yes Merryl Hacker, MD  aspirin EC 81 MG tablet Take 81 mg by mouth every other day.   Yes Historical Provider, MD  azelastine (ASTELIN) 137 MCG/SPRAY nasal spray Place 1 spray into the nose daily as needed. For allergies.   Yes Historical Provider, MD  hydrocortisone (ANUSOL-HC) 25 MG suppository Place 1 suppository (25 mg total) rectally 2 (two) times daily. 03/13/15  Yes Tanna Furry, MD  loratadine (CLARITIN) 10 MG tablet Take 10 mg by mouth daily as needed. For allergies.   Yes Historical Provider, MD  Multiple Vitamin (MULTIVITAMIN WITH MINERALS) TABS Take 1 tablet by mouth every morning.   Yes Historical Provider, MD  omeprazole (PRILOSEC) 20 MG capsule Take 1 capsule (20 mg total) by mouth every morning. 02/16/14  Yes Mahala Menghini, [name]  pravastatin (PRAVACHOL) 20 MG tablet Take 20 mg by mouth at bedtime.    Yes Historical Provider, MD  predniSONE (DELTASONE) 20 MG tablet Take 3 tablets (60 mg total) by mouth daily with breakfast. 03/10/15  Yes Merryl Hacker, MD   BP 111/71 mmHg  Pulse 81  Temp(Src) 97.8 F (36.6 C) (Oral)  Resp 18  Ht 5\' 7"  (1.702 m)  Wt 185 lb (  83.915 kg)  BMI 28.97 kg/m2  SpO2 99% Physical Exam  Constitutional: He is oriented to person, place, and time. He appears well-developed and well-nourished.  HENT:  Head: Normocephalic and atraumatic.  Right Ear: External ear normal.  Left Ear: External ear normal.  Nose: Nose normal.  Mouth/Throat: Oropharynx is clear and moist.  Eyes: Conjunctivae and EOM are normal. Pupils are equal, round, and reactive to light.  Neck: Normal range of motion. Neck supple.  Cardiovascular: Normal rate, regular rhythm, normal heart sounds and intact distal pulses.   Pulmonary/Chest: Effort normal and breath sounds  normal. No respiratory distress. He has no wheezes. He exhibits no tenderness.  Abdominal: Soft. Bowel sounds are normal. He exhibits no distension and no mass. There is no tenderness. There is no guarding.  Genitourinary:  No obvious hemorrhoid or palpable abnormality on rectal exam. There is blood present.  Musculoskeletal: Normal range of motion.  Neurological: He is alert and oriented to person, place, and time. He has normal reflexes. He exhibits normal muscle tone. Coordination normal.  Skin: Skin is warm and dry.  Psychiatric: He has a normal mood and affect. His behavior is normal. Judgment and thought content normal.  Nursing note and vitals reviewed.   ED Course  Procedures (including critical care time) Labs Review Labs Reviewed  CBC WITH DIFFERENTIAL/PLATELET - Abnormal; Notable for the following:    WBC 11.7 (*)    RBC 2.47 (*)    Hemoglobin 7.7 (*)    HCT 21.9 (*)    All other components within normal limits  COMPREHENSIVE METABOLIC PANEL - Abnormal; Notable for the following:    Potassium 3.4 (*)    Glucose, Bld 117 (*)    Calcium 8.2 (*)    Total Protein 5.3 (*)    Albumin 3.3 (*)    GFR calc non Af Amer 88 (*)    All other components within normal limits  LIPASE, BLOOD  PROTIME-INR  PREPARE RBC (CROSSMATCH)    Imaging Review No results found.   EKG Interpretation   Date/Time:  Sunday March 14 2015 22:39:48 EDT Ventricular Rate:  81 PR Interval:  138 QRS Duration: 143 QT Interval:  425 QTC Calculation: 493 R Axis:   71 Text Interpretation:  Sinus rhythm Right bundle branch block No old  tracing to compare Confirmed by Jontavious Commons MD, Andee Poles [phone]) on 03/14/2015  11:32:15 PM      MDM   Final diagnoses:  Acute GI bleeding    Patient with rectal bleeding. Today hemoglobin has decreased to 7.7 from 11 yesterday. He remains normotensive and non-tachycardic. 2 units packed red blood cells are ordered. Has received IV Protonix.    Pattricia Boss,  MD 03/14/15 6503  Pattricia Boss, MD 03/14/15 [phone]

## 2015-03-14 NOTE — ED Notes (Signed)
Pt states he has been having rectal bleeding and was seen here yesterday. Pt was told to come back if it didn't stop.

## 2015-03-14 NOTE — H&P (Addendum)
Hospitalist Admission History and Physical  Patient name: Tim Cunningham Medical record number: [id number] Date of birth: [birth year] Age: 64 y.o. Gender: male  Primary Care Provider: MUSE,[name], [name]  Chief Complaint: GIB History of Present Illness:This is a 64 y.o. year old male with significant past medical history of GERD, carcinoid tumor of the rectum presenting with GIB. Pt reports GI bleeding over past 2 days. Was seen in ER for sxs yesterday. Had heme positive stools at the time. Hgb @ 11.7. Pt discharged home. Pt states that bloody diarrhea persisted at home. Became progressively weak today. Denies any NSAID use apart from baby ASA. No abd pain. Is followed by oncology outpt for carcinoid tumor. Has been stable per pt. Last colonoscopy 11/2014. Had carcinoid polyp removed at that time. Followed by Dr. Oneida Alar. Recently completed course for bronchitis. This has been stable.   Presented to ER afebrile, hemodynamically stable. hgb 7.7 (11.9 yesterday). Hemoccult positive. Pending 2 unit pRBC transfusion.  Assessment and Plan: Tim Cunningham is a 64 y.o. year old male presenting with GIB   Active Problems:   Acute GI bleeding   GIB (gastrointestinal bleeding)   1- GIB -pending 2 unit pRBC transfusion -IV PPI  -NPO  -IVFs follow -GI c/s in am   2-Carcinoid tumor of the rectum -s/p carcinoid polypectomy w/ clear margins per colonoscopy 11/2014  -followed by outpt oncology per report  -f/u GI recs -onc c/s as clinically indicated   3- Anemia -Acute blood loss anemia in setting of above -pending pRBC transfusion -serial CBC -follow  4-Leukocytosis -mild -s/p course of prednisone for bronchitis-resolved -no overt signs of infection -follow -cx/abx as clinically indicated   5- GERD -IV PPI   6-HLD  -cont statin  7-Hypokalemia -replete K  -mag level   FEN/GI: NPO. PPI  Prophylaxis: SCDs  Disposition: pending further evaluation  Code Status:Full Code     Patient Active Problem List   Diagnosis Date Noted  . Acute GI bleeding 03/14/2015  . GIB (gastrointestinal bleeding) 03/14/2015  . Colon cancer screening 01/23/2013  . Carcinoid tumor of rectum 12/25/2012  . Umbilical hernia without obstruction or gangrene 10/15/2012  . Heme positive stool 10/15/2012   Past Medical History: Past Medical History  Diagnosis Date  . Hyperlipidemia   . GERD (gastroesophageal reflux disease)   . 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 Tonsillectomy    . Colonoscopy  11/01/2012    SLF: SML IH, RECTAL CARCINOID, HYPEPRPLASTIC POLYPS(2), Moderate TICS/Small lipoma IN HF  . Esophagogastroduodenoscopy  11/01/2012    [id number] ring was found/MILD GASTRITIS/DUODENIIS  . Eus  12/11/2012    RECTAL CARCINOID EXCISED-MARGINS NOT CLEAR    Social History: History   Social History  . Marital Status: Divorced    Spouse Name: N/A  . Number of Children: 5  . Years of Education: N/A   Occupational History  . unemployed    Social History Main Topics  . Smoking status: Former Smoker -- 0.50 packs/day for 10 years    Types: Cigarettes    Quit date: 11/01/1981  . Smokeless tobacco: Never Used     Comment: quit 1999  . Alcohol Use: Yes     Comment: 1-2 drinks a month  . Drug Use: No  . Sexual Activity: Yes    Birth Control/ Protection: None   Other Topics Concern  . None   Social History Narrative  Family History: Family History  Problem Relation Age of Onset  . Colon cancer Other     maternal great uncle  . Lung cancer Other     uncles  . Cancer Mother   . Cancer Maternal Uncle     Allergies: No Known Allergies  Current Facility-Administered Medications  Medication Dose Route Frequency Provider Last Rate Last Dose  . 0.9 %  sodium chloride infusion  10 mL/hr Intravenous Once Pattricia Boss, MD      . Derrill Memo ON 03/15/2015] 0.9 % NaCl with KCl 20 mEq/ L   infusion   Intravenous Continuous Deneise Lever, MD       Current Outpatient Prescriptions  Medication Sig Dispense Refill  . albuterol (PROVENTIL HFA;VENTOLIN HFA) 108 (90 BASE) MCG/ACT inhaler Inhale 2 puffs into the lungs every 4 (four) hours as needed for wheezing or shortness of breath. 1 Inhaler 0  . aspirin EC 81 MG tablet Take 81 mg by mouth every other day.    Marland Kitchen azelastine (ASTELIN) 137 MCG/SPRAY nasal spray Place 1 spray into the nose daily as needed. For allergies.    . hydrocortisone (ANUSOL-HC) 25 MG suppository Place 1 suppository (25 mg total) rectally 2 (two) times daily. 6 suppository 0  . loratadine (CLARITIN) 10 MG tablet Take 10 mg by mouth daily as needed. For allergies.    . Multiple Vitamin (MULTIVITAMIN WITH MINERALS) TABS Take 1 tablet by mouth every morning.    Marland Kitchen omeprazole (PRILOSEC) 20 MG capsule Take 1 capsule (20 mg total) by mouth every morning. 30 capsule 11  . pravastatin (PRAVACHOL) 20 MG tablet Take 20 mg by mouth at bedtime.     . predniSONE (DELTASONE) 20 MG tablet Take 3 tablets (60 mg total) by mouth daily with breakfast. 12 tablet 0   Review Of Systems: 12 point ROS negative except as noted above in HPI.  Physical Exam: Filed Vitals:   03/14/15 2303  BP: 111/71  Pulse: 81  Temp:   Resp: 18    General: alert and cooperative HEENT: PERRLA and extra ocular movement intact Heart: S1, S2 normal, no murmur, rub or gallop, regular rate and rhythm Lungs: clear to auscultation, no wheezes or rales and unlabored breathing Abdomen: abdomen is soft without significant tenderness, masses, organomegaly or guarding Extremities: extremities normal, atraumatic, no cyanosis or edema Skin:no rashes, no ecchymoses Neurology: normal without focal findings  Labs and Imaging: Lab Results  Component Value Date/Time   NA 137 03/14/2015 10:50 PM   NA 140 09/02/2012 10:58 AM   K 3.4* 03/14/2015 10:50 PM   K 4.2 09/02/2012 10:58 AM   CL 103 03/14/2015 10:50 PM    CL 105 09/02/2012 10:58 AM   CO2 28 03/14/2015 10:50 PM   BUN 20 03/14/2015 10:50 PM   BUN 10 09/02/2012 10:58 AM   CREATININE 0.89 03/14/2015 10:50 PM   CREATININE 0.79 09/02/2012 10:58 AM   GLUCOSE 117* 03/14/2015 10:50 PM   Lab Results  Component Value Date   WBC 11.7* 03/14/2015   HGB 7.7* 03/14/2015   HCT 21.9* 03/14/2015   MCV 88.7 03/14/2015   PLT 159 03/14/2015    Dg Chest Portable 1 View  03/14/2015   CLINICAL DATA:  Initial evaluation for mild cough.  EXAM: PORTABLE CHEST - 1 VIEW  COMPARISON:  Prior radiograph from 03/10/2015  FINDINGS: There is accentuation of the cardiac silhouette, likely related AP technique and shallow lung inflation. Mediastinal silhouette within normal limits.  The lungs are hypoinflated with secondary  bronchovascular crowding. No definite focal infiltrates identified. No pulmonary edema or pleural effusion. No pneumothorax.  No acute osseus abnormality.  IMPRESSION: Shallow lung inflation with secondary bibasilar bronchovascular crowding and/ or atelectasis. No definite active cardiopulmonary disease identified.   Electronically Signed   By: Jeannine Boga M.D.   On: 03/14/2015 23:44           Shanda Howells MD  Pager: [phone]

## 2015-03-15 ENCOUNTER — Encounter (HOSPITAL_COMMUNITY): Payer: Self-pay | Admitting: Gastroenterology

## 2015-03-15 ENCOUNTER — Telehealth: Payer: Self-pay | Admitting: Gastroenterology

## 2015-03-15 ENCOUNTER — Encounter (HOSPITAL_COMMUNITY)
Admission: EM | Disposition: A | Discharge status: Home / Self Care | Payer: Self-pay | Source: Home / Self Care | Attending: Internal Medicine

## 2015-03-15 DIAGNOSIS — D62 Acute posthemorrhagic anemia: Secondary | ICD-10-CM | POA: Insufficient documentation

## 2015-03-15 DIAGNOSIS — K922 Gastrointestinal hemorrhage, unspecified: Secondary | ICD-10-CM

## 2015-03-15 HISTORY — PX: COLONOSCOPY: SHX5424

## 2015-03-15 LAB — ABO/RH: ABO/RH(D): O POS

## 2015-03-15 LAB — CBC WITH DIFFERENTIAL/PLATELET
BASOS ABS: 0 10*3/uL (ref 0.0–0.1)
BASOS ABS: 0 10*3/uL (ref 0.0–0.1)
Basophils Relative: 0 % (ref 0–1)
Basophils Relative: 0 % (ref 0–1)
EOS PCT: 1 % (ref 0–5)
EOS PCT: 1 % (ref 0–5)
Eosinophils Absolute: 0.1 10*3/uL (ref 0.0–0.7)
Eosinophils Absolute: 0.1 10*3/uL (ref 0.0–0.7)
HCT: 21.7 % — ABNORMAL LOW (ref 39.0–52.0)
HCT: 27 % — ABNORMAL LOW (ref 39.0–52.0)
HEMOGLOBIN: 7.6 g/dL — AB (ref 13.0–17.0)
Hemoglobin: 9.3 g/dL — ABNORMAL LOW (ref 13.0–17.0)
LYMPHS ABS: 2.8 10*3/uL (ref 0.7–4.0)
LYMPHS ABS: 3.9 10*3/uL (ref 0.7–4.0)
LYMPHS PCT: 31 % (ref 12–46)
LYMPHS PCT: 35 % (ref 12–46)
MCH: 29.8 pg (ref 26.0–34.0)
MCH: 30.8 pg (ref 26.0–34.0)
MCHC: 34.4 g/dL (ref 30.0–36.0)
MCHC: 35 g/dL (ref 30.0–36.0)
MCV: 86.5 fL (ref 78.0–100.0)
MCV: 87.9 fL (ref 78.0–100.0)
MONO ABS: 0.7 10*3/uL (ref 0.1–1.0)
MONOS PCT: 6 % (ref 3–12)
Monocytes Absolute: 0.6 10*3/uL (ref 0.1–1.0)
Monocytes Relative: 6 % (ref 3–12)
NEUTROS PCT: 58 % (ref 43–77)
Neutro Abs: 5.6 10*3/uL (ref 1.7–7.7)
Neutro Abs: 6.2 10*3/uL (ref 1.7–7.7)
Neutrophils Relative %: 61 % (ref 43–77)
Platelets: 131 10*3/uL — ABNORMAL LOW (ref 150–400)
Platelets: 157 10*3/uL (ref 150–400)
RBC: 2.47 MIL/uL — AB (ref 4.22–5.81)
RBC: 3.12 MIL/uL — ABNORMAL LOW (ref 4.22–5.81)
RDW: 12.7 % (ref 11.5–15.5)
RDW: 13.3 % (ref 11.5–15.5)
WBC: 10.9 10*3/uL — ABNORMAL HIGH (ref 4.0–10.5)
WBC: 9 10*3/uL (ref 4.0–10.5)

## 2015-03-15 LAB — GLUCOSE, CAPILLARY: Glucose-Capillary: 107 mg/dL — ABNORMAL HIGH (ref 70–99)

## 2015-03-15 LAB — PREPARE RBC (CROSSMATCH)

## 2015-03-15 LAB — COMPREHENSIVE METABOLIC PANEL
ALT: 40 U/L (ref 0–53)
AST: 24 U/L (ref 0–37)
Albumin: 3.2 g/dL — ABNORMAL LOW (ref 3.5–5.2)
Alkaline Phosphatase: 52 U/L (ref 39–117)
Anion gap: 4 — ABNORMAL LOW (ref 5–15)
BUN: 14 mg/dL (ref 6–23)
CHLORIDE: 106 mmol/L (ref 96–112)
CO2: 28 mmol/L (ref 19–32)
Calcium: 8 mg/dL — ABNORMAL LOW (ref 8.4–10.5)
Creatinine, Ser: 0.79 mg/dL (ref 0.50–1.35)
GFR calc Af Amer: >90 mL/min (ref >90)
Glucose, Bld: 110 mg/dL — ABNORMAL HIGH (ref 70–99)
POTASSIUM: 3.5 mmol/L (ref 3.5–5.1)
SODIUM: 138 mmol/L (ref 135–145)
Total Bilirubin: 0.6 mg/dL (ref 0.3–1.2)
Total Protein: 5.2 g/dL — ABNORMAL LOW (ref 6.0–8.3)

## 2015-03-15 LAB — MAGNESIUM: MAGNESIUM: 2 mg/dL (ref 1.5–2.5)

## 2015-03-15 SURGERY — COLONOSCOPY
Anesthesia: Moderate Sedation

## 2015-03-15 MED ORDER — STERILE WATER FOR IRRIGATION IR SOLN
Status: DC | PRN
  Administered 2015-03-15: 15:00:00

## 2015-03-15 MED ORDER — PEG 3350-KCL-NA BICARB-NACL 420 G PO SOLR
4000.0000 mL | Freq: Once | ORAL | Status: AC
  Administered 2015-03-15: 4000 mL via ORAL
  Filled 2015-03-15: qty 4000

## 2015-03-15 MED ORDER — ONDANSETRON HCL 4 MG/2ML IJ SOLN
INTRAMUSCULAR | Status: DC | PRN
  Administered 2015-03-15: 4 mg via INTRAVENOUS

## 2015-03-15 MED ORDER — PRAVASTATIN SODIUM 10 MG PO TABS
20.0000 mg | ORAL_TABLET | Freq: Every day | ORAL | Status: DC
  Administered 2015-03-15: 20 mg via ORAL
  Filled 2015-03-15 (×2): qty 1
  Filled 2015-03-15: qty 2

## 2015-03-15 MED ORDER — MIDAZOLAM HCL 5 MG/5ML IJ SOLN
INTRAMUSCULAR | Status: AC
  Filled 2015-03-15: qty 10

## 2015-03-15 MED ORDER — ONDANSETRON HCL 4 MG/2ML IJ SOLN
INTRAMUSCULAR | Status: AC
  Filled 2015-03-15: qty 2

## 2015-03-15 MED ORDER — LIDOCAINE VISCOUS 2 % MT SOLN
OROMUCOSAL | Status: AC
  Filled 2015-03-15: qty 15

## 2015-03-15 MED ORDER — MEPERIDINE HCL 100 MG/ML IJ SOLN
INTRAMUSCULAR | Status: AC
  Filled 2015-03-15: qty 2

## 2015-03-15 MED ORDER — MIDAZOLAM HCL 5 MG/5ML IJ SOLN
INTRAMUSCULAR | Status: DC | PRN
  Administered 2015-03-15 (×2): 1 mg via INTRAVENOUS
  Administered 2015-03-15: 2 mg via INTRAVENOUS

## 2015-03-15 MED ORDER — MEPERIDINE HCL 100 MG/ML IJ SOLN
INTRAMUSCULAR | Status: DC | PRN
  Administered 2015-03-15: 25 mg via INTRAVENOUS
  Administered 2015-03-15: 50 mg via INTRAVENOUS

## 2015-03-15 MED ORDER — SODIUM CHLORIDE 0.9 % IV SOLN
INTRAVENOUS | Status: DC
  Administered 2015-03-15: 15:00:00 via INTRAVENOUS

## 2015-03-15 MED ORDER — LIDOCAINE VISCOUS 2 % MT SOLN
OROMUCOSAL | Status: DC | PRN
  Administered 2015-03-15: 3 mL via OROMUCOSAL

## 2015-03-15 MED ORDER — SODIUM CHLORIDE 0.9 % IJ SOLN
PREFILLED_SYRINGE | INTRAMUSCULAR | Status: DC | PRN
  Administered 2015-03-15: 5 mL

## 2015-03-15 MED ORDER — AZELASTINE HCL 0.1 % NA SOLN
1.0000 | Freq: Every day | NASAL | Status: DC | PRN
  Filled 2015-03-15: qty 30

## 2015-03-15 NOTE — Op Note (Signed)
[facility] [address] [place], [address]   COLONOSCOPY PROCEDURE REPORT  PATIENT: Tim Cunningham, Tim Cunningham  MR#: [id number] BIRTHDATE: 29-Mar-1951 , 22  yrs. old GENDER: male ENDOSCOPIST: R.  Garfield Cornea, MD FACP FACG REFERRED LK:[id number] Muse, PA PROCEDURE DATE:  Apr 09, 2015 PROCEDURE:   Colonoscopy with control of bleeding INDICATIONS:hematochezia; hemodynamically significant?"transfusion required. MEDICATIONS: Versed 4 mg IV and Demerol 75 mg IV in divided doses. Zofran 4 mg IV. ASA CLASS:       Class III  CONSENT: The risks, benefits, alternatives and imponderables including but not limited to bleeding, perforation as well as the possibility of a missed lesion have been reviewed.  The potential for biopsy, lesion removal, etc. have also been discussed. Questions have been answered.  All parties agreeable.  Please see the history and physical in the medical record for more information.  DESCRIPTION OF PROCEDURE:   After the risks benefits and alternatives of the procedure were thoroughly explained, informed consent was obtained.  The digital rectal exam revealed no rectal mass.   The EC-[id number] ([id number])  endoscope was introduced through the anus and advanced to the terminal ileum which was intubated for a short distance. No adverse events experienced.   The quality of the prep was     The instrument was then slowly withdrawn as the colon was fully examined.      COLON FINDINGS: Area of mucosal tattooing distal rectum identified. No evidence of recurrent carcinoid tumor.  Remainder of rectal mucosa appeared normal.  Pancolonic diverticulosis.  Clot in numerous diverticula and in the colonic effluentlot.  There was some diluted, blood-tinged effluent seen throughout the colon. Diverticula extended all the way to the base the cecum.  No actively bleeding lesion initially seen.  The terminal ileum was intubated a good 20 cm and there was no blood in this  segment of the GI tract.  The mucosa appeared normal here However, as I pulled the scope out of the cecum this precipitated arterial bleeding from a diverticulum which was located between the appendiceal orifice and the ileocecal valve.  Please see sequential photographs.  A total of 5 mL of 1 in 10,000 epinephrine was injected submucosally to slow the bleeding.  This was done with good effect. Subsequently, a resolution clip was placed with partial slowing of of the bleeding.  Subsequently 3 cook clips were placed with excellent hemostasis being achieved.  The remainder of the colonic mucosa appeared normal.  Retroflexion was performed. .  Withdrawal time=33 minutes 0 seconds.  The scope was withdrawn and the procedure completed. COMPLICATIONS: There were no immediate complications.  ENDOSCOPIC IMPRESSION: Pancolonic diverticulosis. Arterial bleeding arising from a cecal diverticulum status post hemostasis clip placement and injection therapy. Site of prior rectal mucosal tattooing identified with no evidence of recurrent carcinoid tumor.  No EGD done today.  RECOMMENDATIONS: Clear liquid diet. Follow H&H. No MRI until clips known to have passed.  eSigned:  R. Garfield Cornea, MD Rosalita Chessman [facility] Apr 09, 2015 4:19 PM   cc:  CPT CODES: ICD CODES:  The ICD and CPT codes recommended by this software are interpretations from the data that the clinical staff has captured with the software.  The verification of the translation of this report to the ICD and CPT codes and modifiers is the sole responsibility of the health care institution and practicing physician where this report was generated.  [employer] will not be held responsible for the validity of the ICD and CPT codes included on  this report.  AMA assumes no liability for data contained or not contained herein. CPT is a Designer, television/film set of the [employer].  PATIENT NAME:  Tim Cunningham, Tim Cunningham MR#: [id number]

## 2015-03-15 NOTE — Care Management Utilization Note (Signed)
UR completed 

## 2015-03-15 NOTE — Consult Note (Signed)
Referring Provider: Dr. Roderic Palau Primary Care Physician:  Raiford Simmonds., [name] Primary Gastroenterologist:  Dr. Oneida Alar   Date of Admission: 03/15/15 Date of Consultation: 03/16/15  Reason for Consultation:  GI bleeding  HPI:  Tim Cunningham is a 64 y.o. year old male with a history of rectal carcinoid tumor s/p transanal resection by Dr. Paulita Fujita in Jan 2014. Tumor nests were seen at the resection border, but the plan was to follow periodically with endoscopic evaluations and not pursue surgical intervention. He is seen by oncology here at [facility], and his last flexible sigmoidoscopy was in Jan 2016 by Dr. Paulita Fujita. This was without any evidence of residual lesion. Full colonoscopy last completed Dec 2013 by Dr. Oneida Alar. EGD at that time with mild gastritis, duodenitis.   Notes acute onset of large volume hematochezia on Saturday. Last bleeding noted overnight. 2 units PRBCs received, with  post-transfusion H/H improved to 9.3. Went to the ED on Saturday and evaluated, then returned thereafter. No abdominal pain. Has noted constipation prior to this episode intermittently. Uses stool softener as needed for constipation. No N/V. No melena. No fever/chills. Denies any loss of appetite. Walking for exercise. Dec 2015 201 in our office, now 186. Baby aspirin daily. No aspirin powders, rare use of NSAIDs. Baseline Hgb in the 13 range, with drop to 7.6.   Past Medical History  Diagnosis Date  . Hyperlipidemia   . GERD (gastroesophageal reflux disease)   . 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 Tonsillectomy    . Colonoscopy  11/01/2012    Dr. Oneida Alar: SML IH, RECTAL CARCINOID, HYPEPRPLASTIC POLYPS(2), Moderate TICS/Small lipoma IN HF  . Esophagogastroduodenoscopy  11/01/2012    Dr. Fields:Schatzki ring was found/MILD GASTRITIS/DUODENIIS  . Eus  12/11/2012    RECTAL CARCINOID EXCISED-MARGINS NOT CLEAR  . Flexible sigmoidoscopy  Jan 2016     WITH EUS. Dr. Paulita Fujita. NO residual lesion identified    Prior to Admission medications   Medication Sig Start Date End Date Taking? Authorizing Provider  albuterol (PROVENTIL HFA;VENTOLIN HFA) 108 (90 BASE) MCG/ACT inhaler Inhale 2 puffs into the lungs every 4 (four) hours as needed for wheezing or shortness of breath. 03/10/15  Yes Merryl Hacker, MD  aspirin EC 81 MG tablet Take 81 mg by mouth every other day.   Yes Historical Provider, MD  azelastine (ASTELIN) 137 MCG/SPRAY nasal spray Place 1 spray into the nose daily as needed. For allergies.   Yes Historical Provider, MD  hydrocortisone (ANUSOL-HC) 25 MG suppository Place 1 suppository (25 mg total) rectally 2 (two) times daily. 03/13/15  Yes Tanna Furry, MD  loratadine (CLARITIN) 10 MG tablet Take 10 mg by mouth daily as needed. For allergies.   Yes Historical Provider, MD  Multiple Vitamin (MULTIVITAMIN WITH MINERALS) TABS Take 1 tablet by mouth every morning.   Yes Historical Provider, MD  omeprazole (PRILOSEC) 20 MG capsule Take 1 capsule (20 mg total) by mouth every morning. 02/16/14  Yes Mahala Menghini, [name]  pravastatin (PRAVACHOL) 20 MG tablet Take 20 mg by mouth at bedtime.    Yes Historical Provider, MD  predniSONE (DELTASONE) 20 MG tablet Take 3 tablets (60 mg total) by mouth daily with breakfast. 03/10/15  Yes Merryl Hacker, MD    Current Facility-Administered Medications  Medication Dose Route Frequency Provider Last Rate Last Dose  . 0.9 %  sodium chloride infusion  10 mL/hr Intravenous Once Pattricia Boss,  MD      . 0.9 % NaCl with KCl 20 mEq/ L  infusion   Intravenous Continuous Deneise Lever, MD 100 mL/hr at 03/15/15 [phone]    . azelastine (ASTELIN) 0.1 % nasal spray 1 spray  1 spray Each Nare Daily PRN Deneise Lever, MD      . pantoprazole (PROTONIX) 80 mg in sodium chloride 0.9 % 100 mL IVPB  80 mg Intravenous Q24H Deneise Lever, MD   80 mg at 03/15/15 0000  . polyethylene glycol-electrolytes (NuLYTELY/GoLYTELY)  solution 4,000 mL  4,000 mL Oral Once Orvil Feil, NP      . pravastatin (PRAVACHOL) tablet 20 mg  20 mg Oral QHS Deneise Lever, MD        Allergies as of 03/14/2015  . (No Known Allergies)    Family History  Problem Relation Age of Onset  . Colon cancer Other     maternal great uncle  . Lung cancer Other     uncles  . Cancer Mother   . Cancer Maternal Uncle     History   Social History  . Marital Status: Divorced    Spouse Name: N/A  . Number of Children: 5  . Years of Education: N/A   Occupational History  . unemployed    Social History Main Topics  . Smoking status: Former Smoker -- 0.50 packs/day for 10 years    Types: Cigarettes    Quit date: 11/01/1981  . Smokeless tobacco: Never Used     Comment: quit 1999  . Alcohol Use: Yes     Comment: 1-2 drinks a month  . Drug Use: No  . Sexual Activity: Yes    Birth Control/ Protection: None   Other Topics Concern  . Not on file   Social History Narrative    Review of Systems: Gen: see HPI  CV: Denies chest pain, heart palpitations, syncope, edema  Resp: +cough GI: see HPI GU : Denies urinary burning, urinary frequency, urinary incontinence.  MS: Denies joint pain,swelling, cramping Derm: Denies rash, itching, dry skin Psych: Denies depression, anxiety,confusion, or memory loss Heme: see HPI  Physical Exam: Vital signs in last 24 hours: Temp:  [97.3 F (36.3 C)-98.5 F (36.9 C)] 97.5 F (36.4 C) (04/25 0710) Pulse Rate:  [63-84] 68 (04/25 0710) Resp:  [18-20] 20 (04/25 0710) BP: (106-143)/(60-75) 108/64 mmHg (04/25 0710) SpO2:  [99 %-100 %] 100 % (04/25 0710) Weight:  [185 lb (83.915 kg)-186 lb 15.2 oz (84.8 kg)] 186 lb 15.2 oz (84.8 kg) (04/25 0058) Last BM Date: 03/14/15 General:   Alert,  Well-developed, well-nourished, pleasant and cooperative in NAD Head:  Normocephalic and atraumatic. Eyes:  Sclera clear, no icterus.   Conjunctiva pink. Ears:  Normal auditory acuity. Nose:  No deformity,  discharge,  or lesions. Mouth:  No deformity or lesions, dentition normal. Lungs:  Clear throughout to auscultation.   No wheezes, crackles, or rhonchi. No acute distress. Heart:  S1 S2 present, no murmurs appreciated Abdomen:  Soft, nontender and nondistended. No masses, hepatosplenomegaly or hernias noted. Normal bowel sounds, without guarding, and without rebound.   Rectal:  Deferred  Msk:  Symmetrical without gross deformities. Normal posture. Extremities:  Without edema. Neurologic:  Alert and  oriented x4 Skin:  Intact without significant lesions or rashes. Psych:  Alert and cooperative. Normal mood and affect.  Intake/Output from previous day: 04/24 0701 - 04/25 0700 In: 670 [Blood:670] Out: 450 [Urine:450] Intake/Output this shift: Total I/O In: -  Out: 400 [Urine:400]  Lab Results:  Recent Labs  03/14/15 2250 03/15/15 0015 03/15/15 0915  WBC 11.7* 10.9* 9.0  HGB 7.7* 7.6* 9.3*  HCT 21.9* 21.7* 27.0*  PLT 159 157 131*   BMET  Recent Labs  03/13/15 1523 03/14/15 2250  NA 138 137  K 4.1 3.4*  CL 105 103  CO2 25 28  GLUCOSE 162* 117*  BUN 19 20  CREATININE 0.97 0.89  CALCIUM 8.9 8.2*   LFT  Recent Labs  03/13/15 1523 03/14/15 2250  PROT 6.8 5.3*  ALBUMIN 4.0 3.3*  AST 48* 30  ALT 57* 45  ALKPHOS 68 53  BILITOT 0.7 0.3   PT/INR  Recent Labs  03/14/15 2250  LABPROT 13.4  INR 1.01    Studies/Results: Dg Chest Portable 1 View  03/14/2015   CLINICAL DATA:  Initial evaluation for mild cough.  EXAM: PORTABLE CHEST - 1 VIEW  COMPARISON:  Prior radiograph from 03/10/2015  FINDINGS: There is accentuation of the cardiac silhouette, likely related AP technique and shallow lung inflation. Mediastinal silhouette within normal limits.  The lungs are hypoinflated with secondary bronchovascular crowding. No definite focal infiltrates identified. No pulmonary edema or pleural effusion. No pneumothorax.  No acute osseus abnormality.  IMPRESSION: Shallow lung  inflation with secondary bibasilar bronchovascular crowding and/ or atelectasis. No definite active cardiopulmonary disease identified.   Electronically Signed   By: Jeannine Boga M.D.   On: 03/14/2015 23:44    Impression: 64 year old male with history of rectal carcinoid tumor s/p endoscopic resection in Jan 2014, presenting with large-volume painless hematochezia of likely diverticular origin. Recent flex sig/ultrasound Jan 2016 without evidence of residual lesion. Known diverticulosis on prior colonoscopy in 2013. Acute blood loss anemia noted, with improvement to the 9 range after 2 units PRBCs. Needs further evaluation via colonoscopy with possible upper endoscopy. Doubt rapid transit upper GI bleed.  If no outright source for GI bleeding despite endoscopic evaluation, would pursue capsule study as appropriate. For now, will plan on colonoscopy +/- EGD with Dr. Gala Romney today. The risks and benefits were discussed with patient and wife at the bedside, with both stating understanding.   Plan: Golytely now Tap water enema X 2 after prep completed Colonoscopy +/- EGD with Dr. Gala Romney today, 4/25 NPO except golytely Will continue to follow with you  Orvil Feil, ANP-BC [facility]      LOS: 1 day    03/15/2015, 9:39 AM  Attending note:  Patient seen and examined. Agree with above assessment and recommendations. Will proceed with a colonoscopy with possible EGD to follow-up this afternoon.  The risks, benefits, limitations, imponderables and alternatives regarding both EGD and colonoscopy have been reviewed with the patient. Questions have been answered. All parties agreeable.

## 2015-03-15 NOTE — Telephone Encounter (Signed)
Patient daughter called and wanted to let Dr. Oneida Alar know that her dad is in the hospital and is having rectal bleeding.  They are having a GI doctor come in today and the to find out the source of the bleeding.

## 2015-03-15 NOTE — Progress Notes (Signed)
Patient admitted to the hospital earlier this morning by Dr. Ernestina Patches  Patient seen and examined.  He has been admitted with GI bleeding and has associated acute blood loss anemia. He has been transfused 2 unit prbc with improvement in hemoglobin. GI has seen patient and plans on colonoscopy +/- EGD. He is hemodynamically stable.  Bryanda Mikel

## 2015-03-16 ENCOUNTER — Encounter (HOSPITAL_COMMUNITY): Payer: Self-pay | Admitting: Internal Medicine

## 2015-03-16 ENCOUNTER — Telehealth: Payer: Self-pay | Admitting: Gastroenterology

## 2015-03-16 DIAGNOSIS — E785 Hyperlipidemia, unspecified: Secondary | ICD-10-CM | POA: Insufficient documentation

## 2015-03-16 DIAGNOSIS — K921 Melena: Secondary | ICD-10-CM

## 2015-03-16 DIAGNOSIS — K5731 Diverticulosis of large intestine without perforation or abscess with bleeding: Principal | ICD-10-CM

## 2015-03-16 LAB — CBC WITH DIFFERENTIAL/PLATELET
Basophils Absolute: 0 10*3/uL (ref 0.0–0.1)
Basophils Absolute: 0 10*3/uL (ref 0.0–0.1)
Basophils Relative: 0 % (ref 0–1)
Basophils Relative: 0 % (ref 0–1)
EOS ABS: 0.2 10*3/uL (ref 0.0–0.7)
EOS PCT: 2 % (ref 0–5)
Eosinophils Absolute: 0.2 10*3/uL (ref 0.0–0.7)
Eosinophils Relative: 2 % (ref 0–5)
HCT: 23.8 % — ABNORMAL LOW (ref 39.0–52.0)
HCT: 24.4 % — ABNORMAL LOW (ref 39.0–52.0)
HEMOGLOBIN: 8.1 g/dL — AB (ref 13.0–17.0)
Hemoglobin: 8.4 g/dL — ABNORMAL LOW (ref 13.0–17.0)
LYMPHS PCT: 30 % (ref 12–46)
Lymphocytes Relative: 28 % (ref 12–46)
Lymphs Abs: 2.8 10*3/uL (ref 0.7–4.0)
Lymphs Abs: 2.4 10*3/uL (ref 0.7–4.0)
MCH: 29.9 pg (ref 26.0–34.0)
MCH: 30.3 pg (ref 26.0–34.0)
MCHC: 34.4 g/dL (ref 30.0–36.0)
MCHC: 34 g/dL (ref 30.0–36.0)
MCV: 87.8 fL (ref 78.0–100.0)
MCV: 88.1 fL (ref 78.0–100.0)
MONOS PCT: 6 % (ref 3–12)
Monocytes Absolute: 0.6 10*3/uL (ref 0.1–1.0)
Monocytes Absolute: 0.6 10*3/uL (ref 0.1–1.0)
Monocytes Relative: 7 % (ref 3–12)
NEUTROS PCT: 62 % (ref 43–77)
Neutro Abs: 5.9 10*3/uL (ref 1.7–7.7)
Neutro Abs: 5.5 10*3/uL (ref 1.7–7.7)
Neutrophils Relative %: 63 % (ref 43–77)
Platelets: 140 10*3/uL — ABNORMAL LOW (ref 150–400)
Platelets: 142 10*3/uL — ABNORMAL LOW (ref 150–400)
RBC: 2.77 MIL/uL — ABNORMAL LOW (ref 4.22–5.81)
RBC: 2.71 MIL/uL — AB (ref 4.22–5.81)
RDW: 13.7 % (ref 11.5–15.5)
RDW: 13.6 % (ref 11.5–15.5)
WBC: 9.5 10*3/uL (ref 4.0–10.5)
WBC: 8.7 10*3/uL (ref 4.0–10.5)

## 2015-03-16 LAB — COMPREHENSIVE METABOLIC PANEL
ALT: 40 U/L (ref 0–53)
ANION GAP: 6 (ref 5–15)
AST: 23 U/L (ref 0–37)
Albumin: 3 g/dL — ABNORMAL LOW (ref 3.5–5.2)
Alkaline Phosphatase: 45 U/L (ref 39–117)
BILIRUBIN TOTAL: 0.6 mg/dL (ref 0.3–1.2)
BUN: 7 mg/dL (ref 6–23)
CHLORIDE: 108 mmol/L (ref 96–112)
CO2: 27 mmol/L (ref 19–32)
CREATININE: 0.79 mg/dL (ref 0.50–1.35)
Calcium: 8.1 mg/dL — ABNORMAL LOW (ref 8.4–10.5)
Glucose, Bld: 98 mg/dL (ref 70–99)
Potassium: 3.5 mmol/L (ref 3.5–5.1)
Sodium: 141 mmol/L (ref 135–145)
TOTAL PROTEIN: 5.1 g/dL — AB (ref 6.0–8.3)

## 2015-03-16 LAB — PREPARE RBC (CROSSMATCH)

## 2015-03-16 MED ORDER — SODIUM CHLORIDE 0.9 % IV SOLN
Freq: Once | INTRAVENOUS | Status: AC
  Administered 2015-03-16: 14:00:00 via INTRAVENOUS

## 2015-03-16 MED ORDER — ASPIRIN EC 81 MG PO TBEC: 81.0000 mg | DELAYED_RELEASE_TABLET | ORAL | Status: DC

## 2015-03-16 NOTE — Telephone Encounter (Signed)
REVIEWED-NO ADDITIONAL RECOMMENDATIONS. 

## 2015-03-16 NOTE — Progress Notes (Signed)
    Subjective: States small amount of blood this morning with BM. No abdominal pain, N/V.   Objective: Vital signs in last 24 hours: Temp:  [97.8 F (36.6 C)-98.1 F (36.7 C)] 97.9 F (36.6 C) (04/26 0600) Pulse Rate:  [62-109] 69 (04/26 0600) Resp:  [15-23] 15 (04/26 0600) BP: (115-158)/(57-85) 119/57 mmHg (04/26 0600) SpO2:  [96 %-100 %] 99 % (04/26 0600) Last BM Date: 03/15/15 General:   Alert and oriented, pleasant Head:  Normocephalic and atraumatic. Abdomen:  Bowel sounds present, soft, non-tender, non-distended. Possible small umbilical hernia.  Msk:  Symmetrical without gross deformities. Normal posture. Extremities:  Without edema. Neurologic:  Alert and  oriented x4 Psych:  Alert and cooperative. Normal mood and affect.  Intake/Output from previous day: 04/25 0701 - 04/26 0700 In: 3011.7 [I.V.:2811.7; IV Piggyback:200] Out: 750 [Urine:750] Intake/Output this shift:    Lab Results:  Recent Labs  03/15/15 0915 03/16/15 0049 03/16/15 0542  WBC 9.0 9.5 8.7  HGB 9.3* 8.1* 8.4*  HCT 27.0* 23.8* 24.4*  PLT 131* 140* 142*   BMET  Recent Labs  03/14/15 2250 03/15/15 0915 03/16/15 0542  NA 137 138 141  K 3.4* 3.5 3.5  CL 103 106 108  CO2 28 28 27   GLUCOSE 117* 110* 98  BUN 20 14 7   CREATININE 0.89 0.79 0.79  CALCIUM 8.2* 8.0* 8.1*   LFT  Recent Labs  03/14/15 2250 03/15/15 0915 03/16/15 0542  PROT 5.3* 5.2* 5.1*  ALBUMIN 3.3* 3.2* 3.0*  AST 30 24 23   ALT 45 40 40  ALKPHOS 53 52 45  BILITOT 0.3 0.6 0.6   PT/INR  Recent Labs  03/14/15 2250  LABPROT 13.4  INR 1.01    Studies/Results: Dg Chest Portable 1 View  03/14/2015   CLINICAL DATA:  Initial evaluation for mild cough.  EXAM: PORTABLE CHEST - 1 VIEW  COMPARISON:  Prior radiograph from 03/10/2015  FINDINGS: There is accentuation of the cardiac silhouette, likely related AP technique and shallow lung inflation. Mediastinal silhouette within normal limits.  The lungs are hypoinflated  with secondary bronchovascular crowding. No definite focal infiltrates identified. No pulmonary edema or pleural effusion. No pneumothorax.  No acute osseus abnormality.  IMPRESSION: Shallow lung inflation with secondary bibasilar bronchovascular crowding and/ or atelectasis. No definite active cardiopulmonary disease identified.   Electronically Signed   By: Jeannine Boga M.D.   On: 03/14/2015 23:44    Assessment: 64 year old male admitted with rectal bleeding and subsequently acute blood loss anemia secondary to arterial bleeding from a cecal diverticulum, s/p clips and epi injection. 2 units PRBCs this admission. Small amount of blood this morning with BM per patient, but Hgb remaining stable. Appears trivial in nature. Will advance to low-residue diet.    Plan: No MRI until clips have passed Low-residue diet Will arrange follow-up as outpatient with our practice   Orvil Feil, ANP-BC [facility] G[facility]enter[facility]       LOS: 2 days    03/16/2015, 7:48 AM

## 2015-03-16 NOTE — Progress Notes (Signed)
Tim Cunningham discharged home with wife per MD order.  Discharge instructions reviewed and discussed with the patient, all questions and concerns answered. Copy of instructions and scripts given to patient.    Medication List    STOP taking these medications        predniSONE 20 MG tablet  Commonly known as:  DELTASONE      TAKE these medications        albuterol 108 (90 BASE) MCG/ACT inhaler  Commonly known as:  PROVENTIL HFA;VENTOLIN HFA  Inhale 2 puffs into the lungs every 4 (four) hours as needed for wheezing or shortness of breath.     aspirin EC 81 MG tablet  Take 1 tablet (81 mg total) by mouth every other day. Resume in 1 week     azelastine 0.1 % nasal spray  Commonly known as:  ASTELIN  Place 1 spray into the nose daily as needed. For allergies.     CLARITIN 10 MG tablet  Generic drug:  loratadine  Take 10 mg by mouth daily as needed. For allergies.     hydrocortisone 25 MG suppository  Commonly known as:  ANUSOL-HC  Place 1 suppository (25 mg total) rectally 2 (two) times daily.     multivitamin with minerals Tabs tablet  Take 1 tablet by mouth every morning.     omeprazole 20 MG capsule  Commonly known as:  PRILOSEC  Take 1 capsule (20 mg total) by mouth every morning.     pravastatin 20 MG tablet  Commonly known as:  PRAVACHOL  Take 20 mg by mouth at bedtime.        Patients skin is clean, dry and intact, no evidence of skin break down. IV site discontinued and catheter remains intact. Site without signs and symptoms of complications. Dressing and pressure applied.  Patient escorted to car by Ubaldo Glassing, NT in a wheelchair,  no distress noted upon discharge.  Regino Bellow 03/16/2015 6:26 PM

## 2015-03-16 NOTE — Care Management Note (Signed)
    Page 1 of 1   03/16/2015     1:31:58 PM CARE MANAGEMENT NOTE 03/16/2015  Patient:  Tim Cunningham, Tim Cunningham   Account Number:  [id number]  Date Initiated:  03/16/2015  Documentation initiated by:  Jolene Provost  Subjective/Objective Assessment:   Pt is from home, independent at baseline. Pt plans to discharge home today with self care. No CM needs.     Action/Plan:   Anticipated DC Date:  03/16/2015   Anticipated DC Plan:  [place]  CM consult      Choice offered to / List presented to:             Status of service:  Completed, signed off Medicare Important Message given?   (If response is "NO", the following Medicare IM given date fields will be blank) Date Medicare IM given:   Medicare IM given by:   Date Additional Medicare IM given:   Additional Medicare IM given by:    Discharge Disposition:  HOME/SELF CARE  Per UR Regulation:    If discussed at Long Length of Stay Meetings, dates discussed:    Comments:  03/16/2015 [place], RN, MSN, CM

## 2015-03-16 NOTE — Telephone Encounter (Signed)
Noted  

## 2015-03-16 NOTE — Telephone Encounter (Signed)
Please arrange follow-up with Dr. Oneida Alar in 6 weeks.

## 2015-03-16 NOTE — Discharge Summary (Signed)
Physician Discharge Summary  Tim Cunningham JTT:[id number] DOB: 1951/05/10 DOA: 03/14/2015  PCP: Royce Macadamia D., [name]  Admit date: 03/14/2015 Discharge date: 03/16/2015  Time spent: 40 minutes  Recommendations for Outpatient Follow-up:  1. Follow up with GI as an outpatient 2. No MRI until clips have passed 3. Follow up with primary care physician in 1-2 weeks  Discharge Diagnoses:  Active Problems:   Acute GI bleeding   GIB (gastrointestinal bleeding)   Acute blood loss anemia   Diverticulosis of colon with hemorrhage   Hyperlipidemia   Discharge Condition: improved  Diet recommendation: low salt  Filed Weights   03/14/15 2055 03/15/15 0058  Weight: 83.915 kg (185 lb) 84.8 kg (186 lb 15.2 oz)    History of present illness:  This patient presents to the hospital with GI bleeding that began 2 days prior to admission. He was seen in the ER on the day prior to admission was found to have heme positive stools with hemoglobin 11.7. Patient was discharged home with recommendations for outpatient GI follow-up. He returned home and was found to have bloody diarrhea. He became progressively weak. He came back to the hospital for evaluation where hemoglobin was noted to be 7.7. He was admitted for further treatments.  Hospital Course:  Patient was seen by gastroenterology and underwent colonoscopy with results as below. He was transfused a total of 3 units of PRBCs during his hospital stay. Postprocedure, his hemoglobin has remained stable. His diet was advanced and appears to be tolerating this well. He does not have any complaints at this time. He is ambulated without dizziness and his hemodynamics are stable. The patient will be discharged home today for outpatient follow-up with gastroenterology. He should not have any MRI studies until hemostasis clips have passed.  Procedures: Colonoscopy 4/25: Pancolonic diverticulosis. Arterial bleeding arising from a cecal diverticulum status  post hemostasis clip placement and injection therapy. Site of prior rectal mucosal tattooing identified with no  evidence of recurrent carcinoid tumor. No EGD done today.  Consultations:  Gastroenterology  Discharge Exam: Filed Vitals:   03/16/15 1643  BP: 133/63  Pulse: 64  Temp: 97.8 F (36.6 C)  Resp: 18    General: NAD Cardiovascular: S1, S2 RRR Respiratory: CTA B   Discharge Instructions   Discharge Instructions    Diet - low sodium heart healthy    Complete by:  As directed      Increase activity slowly    Complete by:  As directed           Current Discharge Medication List    CONTINUE these medications which have CHANGED   Details  aspirin EC 81 MG tablet Take 1 tablet (81 mg total) by mouth every other day. Resume in 1 week      CONTINUE these medications which have NOT CHANGED   Details  albuterol (PROVENTIL HFA;VENTOLIN HFA) 108 (90 BASE) MCG/ACT inhaler Inhale 2 puffs into the lungs every 4 (four) hours as needed for wheezing or shortness of breath. Qty: 1 Inhaler, Refills: 0    azelastine (ASTELIN) 137 MCG/SPRAY nasal spray Place 1 spray into the nose daily as needed. For allergies.    hydrocortisone (ANUSOL-HC) 25 MG suppository Place 1 suppository (25 mg total) rectally 2 (two) times daily. Qty: 6 suppository, Refills: 0    loratadine (CLARITIN) 10 MG tablet Take 10 mg by mouth daily as needed. For allergies.    Multiple Vitamin (MULTIVITAMIN WITH MINERALS) TABS Take 1 tablet by mouth every morning.  omeprazole (PRILOSEC) 20 MG capsule Take 1 capsule (20 mg total) by mouth every morning. Qty: 30 capsule, Refills: 11    pravastatin (PRAVACHOL) 20 MG tablet Take 20 mg by mouth at bedtime.       STOP taking these medications     predniSONE (DELTASONE) 20 MG tablet        No Known Allergies    The results of significant diagnostics from this hospitalization (including imaging, microbiology, ancillary and laboratory) are listed  below for reference.    Significant Diagnostic Studies: Dg Chest 2 View  03/10/2015   CLINICAL DATA:  Cough, history of bronchitis.  EXAM: CHEST  2 VIEW  COMPARISON:  02/01/2015  FINDINGS: Heart size upper normal. Calcified right hilar nodes. Mild central peribronchial thickening. No confluent airspace opacity, pleural effusion, pneumothorax. No acute osseous finding.  IMPRESSION: Mild bronchitic change may be acute and/or chronic. No focal consolidation.   Electronically Signed   By: Carlos Levering M.D.   On: 03/10/2015 06:34   Dg Chest Portable 1 View  03/14/2015   CLINICAL DATA:  Initial evaluation for mild cough.  EXAM: PORTABLE CHEST - 1 VIEW  COMPARISON:  Prior radiograph from 03/10/2015  FINDINGS: There is accentuation of the cardiac silhouette, likely related AP technique and shallow lung inflation. Mediastinal silhouette within normal limits.  The lungs are hypoinflated with secondary bronchovascular crowding. No definite focal infiltrates identified. No pulmonary edema or pleural effusion. No pneumothorax.  No acute osseus abnormality.  IMPRESSION: Shallow lung inflation with secondary bibasilar bronchovascular crowding and/ or atelectasis. No definite active cardiopulmonary disease identified.   Electronically Signed   By: Jeannine Boga M.D.   On: 03/14/2015 23:44    Microbiology: No results found for this or any previous visit (from the past 240 hour(s)).   Labs: Basic Metabolic Panel:  Recent Labs Lab 03/13/15 1523 03/14/15 2250 03/15/15 0015 03/15/15 0915 03/16/15 0542  NA 138 137  --  138 141  K 4.1 3.4*  --  3.5 3.5  CL 105 103  --  106 108  CO2 25 28  --  28 27  GLUCOSE 162* 117*  --  110* 98  BUN 19 20  --  14 7  CREATININE 0.97 0.89  --  0.79 0.79  CALCIUM 8.9 8.2*  --  8.0* 8.1*  MG  --   --  2.0  --   --    Liver Function Tests:  Recent Labs Lab 03/13/15 1523 03/14/15 2250 03/15/15 0915 03/16/15 0542  AST 48* 30 24 23   ALT 57* 45 40 40  ALKPHOS  68 53 52 45  BILITOT 0.7 0.3 0.6 0.6  PROT 6.8 5.3* 5.2* 5.1*  ALBUMIN 4.0 3.3* 3.2* 3.0*    Recent Labs Lab 03/14/15 2250  LIPASE 20   No results for input(s): AMMONIA in the last 168 hours. CBC:  Recent Labs Lab 03/14/15 2250 03/15/15 0015 03/15/15 0915 03/16/15 0049 03/16/15 0542  WBC 11.7* 10.9* 9.0 9.5 8.7  NEUTROABS 6.8 6.2 5.6 5.9 5.5  HGB 7.7* 7.6* 9.3* 8.1* 8.4*  HCT 21.9* 21.7* 27.0* 23.8* 24.4*  MCV 88.7 87.9 86.5 87.8 88.1  PLT 159 157 131* 140* 142*   Cardiac Enzymes: No results for input(s): CKTOTAL, CKMB, CKMBINDEX, TROPONINI in the last 168 hours. BNP: BNP (last 3 results) No results for input(s): BNP in the last 8760 hours.  ProBNP (last 3 results) No results for input(s): PROBNP in the last 8760 hours.  CBG:  Recent Labs  Lab 03/15/15 2023  GLUCAP 107*       Signed:  Azure Barrales  [facility] 03/16/2015, 5:08 PM

## 2015-03-17 ENCOUNTER — Encounter: Payer: Self-pay | Admitting: Gastroenterology

## 2015-03-17 LAB — TYPE AND SCREEN
ABO/RH(D): O POS
Antibody Screen: NEGATIVE
Unit division: 0
Unit division: 0
Unit division: 0

## 2015-03-17 NOTE — Care Management Utilization Note (Signed)
UR completed 

## 2015-03-17 NOTE — Telephone Encounter (Signed)
APPOINTMENT MADE AND LETTER SENT °

## 2015-04-01 ENCOUNTER — Other Ambulatory Visit: Payer: Self-pay

## 2015-04-02 MED ORDER — OMEPRAZOLE 20 MG PO CPDR: 20.0000 mg | DELAYED_RELEASE_CAPSULE | Freq: Every day | ORAL | Status: DC

## 2015-04-22 ENCOUNTER — Ambulatory Visit (INDEPENDENT_AMBULATORY_CARE_PROVIDER_SITE_OTHER): Payer: Medicare HMO | Admitting: Gastroenterology

## 2015-04-22 ENCOUNTER — Telehealth: Payer: Self-pay

## 2015-04-22 ENCOUNTER — Encounter: Payer: Self-pay | Admitting: Gastroenterology

## 2015-04-22 VITALS — BP 159/74 | HR 60 | Temp 97.6°F | Ht 67.0 in | Wt 193.0 lb

## 2015-04-22 DIAGNOSIS — K922 Gastrointestinal hemorrhage, unspecified: Secondary | ICD-10-CM

## 2015-04-22 DIAGNOSIS — D3A026 Benign carcinoid tumor of the rectum: Secondary | ICD-10-CM

## 2015-04-22 NOTE — Telephone Encounter (Signed)
VM from [place], pt's daughter, she wanted to know how the office visit went today since she was not able to accompany pt to the appt. I called and LMOM ( [phone])  for a return call. ( See notes from [place] today).

## 2015-04-22 NOTE — Assessment & Plan Note (Addendum)
ACUTE BLEED WHILE TAKING ASA DAILY. RISK FACTOR FOR CV DISEASE: AGE, TOBACCO, MALE. NO PRIOR CV EVENTS. SYMPTOMS CONTROLLED/RESOLVED.  Change ASA TO QOD DRINK WATER EAT FIBER FOLLOW UP IN 1 YEAR.  DISCUSSED WITH PT AND DAUGHTER: BRITTANY-LVM @ [phone] MAY CALL WITH QUESTIONS OR CONCERNS.

## 2015-04-22 NOTE — Assessment & Plan Note (Signed)
NO WARNING SIGNS/SYMPTOMS  CONTINUE TO MONITOR SYMPTOMS. FOLLOW UP IN 1 YEAR.

## 2015-04-22 NOTE — Patient Instructions (Signed)
TAKE ASPRIN EVERY OTHER DAY.  DRINK WATER TO KEEP YOUR URINE LIGHT YELLOW.  FOLLOW A HIGH FIBER DIET. AVOID ITEMS THAT CAUSE BLOATING & GAS. SEE INFO BELOW.  FOLLOW UP IN 1 YEAR.    High-Fiber Diet A high-fiber diet changes your normal diet to include more whole grains, legumes, fruits, and vegetables. Changes in the diet involve replacing refined carbohydrates with unrefined foods. The calorie level of the diet is essentially unchanged. The Dietary Reference Intake (recommended amount) for adult males is 38 grams per day. For adult females, it is 25 grams per day. Pregnant and lactating women should consume 28 grams of fiber per day. Fiber is the intact part of a plant that is not broken down during digestion. Functional fiber is fiber that has been isolated from the plant to provide a beneficial effect in the body. PURPOSE  Increase stool bulk.   Ease and regulate bowel movements.   Lower cholesterol.   REDUCE RISK OF COLON OR RECTAL CANCER  GUIDELINES FOR INCREASING FIBER IN THE DIET  Start adding fiber to the diet slowly. A gradual increase of about 5 more grams (2 slices of whole-wheat bread, 2 servings of most fruits or vegetables, or 1 bowl of high-fiber cereal) per day is best. Too rapid an increase in fiber may result in constipation, flatulence, and bloating.   Drink enough water and fluids to keep your urine clear or pale yellow. Water, juice, or caffeine-free drinks are recommended. Not drinking enough fluid may cause constipation.   Eat a variety of high-fiber foods rather than one type of fiber.   Try to increase your intake of fiber through using high-fiber foods rather than fiber pills or supplements that contain small amounts of fiber.   The goal is to change the types of food eaten. Do not supplement your present diet with high-fiber foods, but replace foods in your present diet.  INCLUDE A VARIETY OF FIBER SOURCES  Replace refined and processed grains with whole  grains, canned fruits with fresh fruits, and incorporate other fiber sources. White rice, white breads, and most bakery goods contain little or no fiber.   Brown whole-grain rice, buckwheat oats, and many fruits and vegetables are all good sources of fiber. These include: broccoli, Brussels sprouts, cabbage, cauliflower, beets, sweet potatoes, white potatoes (skin on), carrots, tomatoes, eggplant, squash, berries, fresh fruits, and dried fruits.   Cereals appear to be the richest source of fiber. Cereal fiber is found in whole grains and bran. Bran is the fiber-rich outer coat of cereal grain, which is largely removed in refining. In whole-grain cereals, the bran remains. In breakfast cereals, the largest amount of fiber is found in those with "bran" in their names. The fiber content is sometimes indicated on the label.   You may need to include additional fruits and vegetables each day.   In baking, for 1 cup white flour, you may use the following substitutions:   1 cup whole-wheat flour minus 2 tablespoons.   1/2 cup white flour plus 1/2 cup whole-wheat flour.

## 2015-04-22 NOTE — Progress Notes (Signed)
   Subjective:    Patient ID: Tim Cunningham, male    DOB: [birth year], 64 y.o.   MRN: [id number]  MUSE,[name], [name]  HPI  No questions or concerns. BMs: every 1-2 days. TAKING ASA DAILY. PT DENIES FEVER, CHILLS, HEMATOCHEZIA, nausea, vomiting, melena, diarrhea, CHEST PAIN, SHORTNESS OF BREATH,  CHANGE IN BOWEL IN HABITS, constipation, abdominal pain, problems swallowing, or heartburn or indigestion.  Past Medical History  Diagnosis Date  . Hyperlipidemia   . GERD (gastroesophageal reflux disease)   . 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 Lower GI bleeding APR 2016 TCS RMR    DUE TO CECAL DIVERTICULUM REQUIRED CLIPS/EPI   Past Surgical History  Procedure Laterality Date  . Tonsillectomy    . Colonoscopy  11/01/2012    Dr. Oneida Alar: SML IH, RECTAL CARCINOID, HYPEPRPLASTIC POLYPS(2), Moderate TICS/Small lipoma IN HF  . Esophagogastroduodenoscopy  11/01/2012    Dr. Diamante Truszkowski:Schatzki ring was found/MILD GASTRITIS/DUODENIIS  . Eus  12/11/2012    RECTAL CARCINOID EXCISED-MARGINS NOT CLEAR  . Flexible sigmoidoscopy  Jan 2016    WITH EUS. Dr. Paulita Fujita. NO residual lesion identified  . Colonoscopy N/A 03/15/2015    RMR: Pancolonic diverticulosis. Arterial bleeding arising from a cecal diverticulum status post hemostasis clip placement and injection therapy . Stie of prior rectal mucosal tattooing identified with no evidence of recurrent carcinoid tumor. No EGD done today,.   No Known Allergies  Current Outpatient Prescriptions  Medication Sig Dispense Refill  . albuterol (PROVENTIL HFA;VENTOLIN HFA) 108 (90 BASE) MCG/ACT inhaler Inhale 2 puffs into the lungs every 4 (four) hours as needed for wheezing or shortness of breath.    Marland Kitchen aspirin EC 81 MG tablet Take 1 tablet  by mouth every other day.     Marland Kitchen azelastine (ASTELIN) 137 MCG/SPRAY nasal spray Place 1 spray into the nose daily as needed. For allergies.    Marland Kitchen loratadine (CLARITIN) 10 MG tablet Take 10 mg by mouth daily as needed. For  allergies.    . Multiple Vitamin (MULTIVITAMIN WITH MINERALS) TABS Take 1 tablet by mouth every morning.    Marland Kitchen omeprazole (PRILOSEC) 20 MG capsule Take 1 capsule by mouth daily before breakfast.    . pravastatin (PRAVACHOL) 20 MG tablet Take 20 mg by mouth at bedtime.     .        Review of Systems     Objective:   Physical Exam  Constitutional: He is oriented to person, place, and time. He appears well-developed and well-nourished. No distress.  HENT:  Head: Normocephalic and atraumatic.  Mouth/Throat: Oropharynx is clear and moist. No oropharyngeal exudate.  Eyes: Pupils are equal, round, and reactive to light. No scleral icterus.  Neck: Normal range of motion.  Cardiovascular: Normal rate, regular rhythm and normal heart sounds.   Pulmonary/Chest: Effort normal and breath sounds normal. No respiratory distress.  Abdominal: Soft. Bowel sounds are normal. He exhibits no distension. There is no tenderness.  Musculoskeletal: He exhibits no edema.  Neurological: He is alert and oriented to person, place, and time.  Psychiatric: He has a normal mood and affect.  Vitals reviewed.         Assessment & Plan:

## 2015-04-27 NOTE — Telephone Encounter (Signed)
I spoke to [place] and she said she had a VM from Dr. Oneida Alar that explained everything and she was not aware of that when she had called.

## 2015-05-05 NOTE — Progress Notes (Signed)
cc'd to pcp 

## 2015-05-17 ENCOUNTER — Other Ambulatory Visit: Payer: Self-pay

## 2015-05-31 ENCOUNTER — Encounter (HOSPITAL_COMMUNITY): Payer: Self-pay | Admitting: Hematology & Oncology

## 2015-05-31 ENCOUNTER — Encounter (HOSPITAL_BASED_OUTPATIENT_CLINIC_OR_DEPARTMENT_OTHER): Payer: Medicare HMO | Admitting: Hematology & Oncology

## 2015-05-31 ENCOUNTER — Ambulatory Visit (HOSPITAL_COMMUNITY): Payer: Commercial Managed Care - HMO | Admitting: Hematology & Oncology

## 2015-05-31 ENCOUNTER — Encounter (HOSPITAL_COMMUNITY): Payer: Medicare HMO | Attending: Hematology & Oncology

## 2015-05-31 VITALS — BP 146/73 | HR 50 | Temp 97.6°F | Resp 15 | Wt 194.4 lb

## 2015-05-31 DIAGNOSIS — K219 Gastro-esophageal reflux disease without esophagitis: Secondary | ICD-10-CM | POA: Diagnosis not present

## 2015-05-31 DIAGNOSIS — Z87891 Personal history of nicotine dependence: Secondary | ICD-10-CM | POA: Diagnosis not present

## 2015-05-31 DIAGNOSIS — D3A026 Benign carcinoid tumor of the rectum: Secondary | ICD-10-CM | POA: Diagnosis not present

## 2015-05-31 DIAGNOSIS — E785 Hyperlipidemia, unspecified: Secondary | ICD-10-CM | POA: Diagnosis not present

## 2015-05-31 DIAGNOSIS — C7A026 Malignant carcinoid tumor of the rectum: Secondary | ICD-10-CM | POA: Diagnosis not present

## 2015-05-31 LAB — COMPREHENSIVE METABOLIC PANEL
ALK PHOS: 73 U/L (ref 38–126)
ALT: 27 U/L (ref 17–63)
AST: 30 U/L (ref 15–41)
Albumin: 4.1 g/dL (ref 3.5–5.0)
Anion gap: 6 (ref 5–15)
BUN: 11 mg/dL (ref 6–20)
CHLORIDE: 106 mmol/L (ref 101–111)
CO2: 28 mmol/L (ref 22–32)
CREATININE: 0.9 mg/dL (ref 0.61–1.24)
Calcium: 9.2 mg/dL (ref 8.9–10.3)
GFR calc non Af Amer: >60 mL/min (ref >60)
GLUCOSE: 100 mg/dL — AB (ref 65–99)
Potassium: 4.1 mmol/L (ref 3.5–5.1)
Sodium: 140 mmol/L (ref 135–145)
Total Bilirubin: 0.7 mg/dL (ref 0.3–1.2)
Total Protein: 7.2 g/dL (ref 6.5–8.1)

## 2015-05-31 LAB — CBC WITH DIFFERENTIAL/PLATELET
BASOS PCT: 0 % (ref 0–1)
Basophils Absolute: 0 10*3/uL (ref 0.0–0.1)
EOS ABS: 0.2 10*3/uL (ref 0.0–0.7)
EOS PCT: 5 % (ref 0–5)
HCT: 40.5 % (ref 39.0–52.0)
Hemoglobin: 13.5 g/dL (ref 13.0–17.0)
Lymphocytes Relative: 33 % (ref 12–46)
Lymphs Abs: 1.5 10*3/uL (ref 0.7–4.0)
MCH: 28.6 pg (ref 26.0–34.0)
MCHC: 33.3 g/dL (ref 30.0–36.0)
MCV: 85.8 fL (ref 78.0–100.0)
MONO ABS: 0.4 10*3/uL (ref 0.1–1.0)
MONOS PCT: 9 % (ref 3–12)
NEUTROS ABS: 2.5 10*3/uL (ref 1.7–7.7)
Neutrophils Relative %: 53 % (ref 43–77)
Platelets: 150 10*3/uL (ref 150–400)
RBC: 4.72 MIL/uL (ref 4.22–5.81)
RDW: 13.7 % (ref 11.5–15.5)
WBC: 4.6 10*3/uL (ref 4.0–10.5)

## 2015-05-31 NOTE — Patient Instructions (Signed)
[place] [facility] Discharge Instructions  RECOMMENDATIONS MADE BY THE CONSULTANT AND ANY TEST RESULTS WILL BE SENT TO YOUR REFERRING PHYSICIAN.  Exam and discussion by Dr. Whitney Muse. You are doing well.  If any issues with your lab results we will call you. Call with any concerns or issues.  Follow-up in 4 months with labs and office visit.  Thank you for choosing [place] [facility] to provide your oncology and hematology care.  To afford each patient quality time with our provider, please arrive at least 15 minutes before your scheduled appointment time.    You need to re-schedule your appointment should you arrive 10 or more minutes late.  We strive to give you quality time with our providers, and arriving late affects you and other patients whose appointments are after yours.  Also, if you no show three or more times for appointments you may be dismissed from the clinic at the providers discretion.     Again, thank you for choosing [facility].  Our hope is that these requests will decrease the amount of time that you wait before being seen by our physicians.       _____________________________________________________________  Should you have questions after your visit to [facility], please contact our office at [phone] between the hours of 8:30 a.m. and 4:30 p.m.  Voicemails left after 4:30 p.m. will not be returned until the following business day.  For prescription refill requests, have your pharmacy contact our office.

## 2015-05-31 NOTE — Progress Notes (Signed)
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 Paulita Cunningham however with tumor nests seen at the resection border.  Dr.Fields for followup.  CURRENT THERAPY: Observation  INTERVAL HISTORY: Tim Cunningham 64 y.o. male returns for follow-up of the diagnosis of carcinoid found on a screening colonoscopy. He has no complaints today. He describes his appetite as good. His bowel movements are normal. His energy level is excellent. He just had a screening colonoscopy with Dr. Sydell Cunningham in April. He was noted to have pancolonic diverticulosis.  He is here alone and feeling pretty good today.  He last saw Dr. Oneida Cunningham last month and was told that everything was okay. He has also seen Dr. Paulita Cunningham.  He has been eating well, sleeping well, and enjoying the summer. He denies diarrhea, SOB, and coughing. He denies any other changes in his baseline.  MEDICAL HISTORY: Past Medical History  Diagnosis Date  . Hyperlipidemia   . GERD (gastroesophageal reflux disease)   . 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 Lower GI bleeding APR 2016 TCS RMR    DUE TO CECAL DIVERTICULUM REQUIRED CLIPS/EPI    has Umbilical hernia without obstruction or gangrene; Heme positive stool; Carcinoid tumor of rectum; Colon cancer screening; Lower GI bleeding DUE TO DIVERTICULUM; GIB (gastrointestinal bleeding); Acute blood loss anemia; Diverticulosis of colon with hemorrhage; and Hyperlipidemia on his problem list.     has No Known Allergies.  Tim Cunningham does not currently have medications on file.  SURGICAL HISTORY: Past Surgical History  Procedure Laterality Date  . Tonsillectomy    . Colonoscopy  11/01/2012    Dr. Oneida Cunningham: SML IH, RECTAL CARCINOID, HYPEPRPLASTIC POLYPS(2), Moderate TICS/Small lipoma IN HF  . Esophagogastroduodenoscopy  11/01/2012    Dr. Fields:Schatzki ring was found/MILD  GASTRITIS/DUODENIIS  . Eus  12/11/2012    RECTAL CARCINOID EXCISED-MARGINS NOT CLEAR  . Flexible sigmoidoscopy  Jan 2016    WITH EUS. Dr. Paulita Cunningham. NO residual lesion identified  . Colonoscopy N/A 03/15/2015    RMR: Pancolonic diverticulosis. Arterial bleeding arising from a cecal diverticulum status post hemostasis clip placement and injection therapy . Stie of prior rectal mucosal tattooing identified with no evidence of recurrent carcinoid tumor. No EGD done today,.    SOCIAL HISTORY: History   Social History  . Marital Status: Divorced    Spouse Name: N/A  . Number of Children: 5  . Years of Education: N/A   Occupational History  . unemployed    Social History Main Topics  . Smoking status: Former Smoker -- 0.50 packs/day for 10 years    Types: Cigarettes    Quit date: 11/01/1981  . Smokeless tobacco: Never Used     Comment: quit 1999  . Alcohol Use: Yes     Comment: 1-2 drinks a month  . Drug Use: No  . Sexual Activity: Yes    Birth Control/ Protection: None   Other Topics Concern  . Not on file   Social History Narrative    FAMILY HISTORY: Family History  Problem Relation Age of Onset  . Colon cancer Other     maternal great uncle  . Lung cancer Other     uncles  . Cancer Mother   . Cancer Maternal Uncle     Review of Systems  Constitutional: Negative for fever, chills, weight loss and malaise/fatigue.  HENT: Negative for congestion, hearing  loss, nosebleeds, sore throat and tinnitus.   Eyes: Negative for blurred vision, double vision, pain and discharge.  Respiratory: Negative for cough, hemoptysis, sputum production, shortness of breath and wheezing.   Cardiovascular: Negative for chest pain, palpitations, claudication, leg swelling and PND.  Gastrointestinal: Negative for heartburn, nausea, vomiting, abdominal pain, diarrhea, constipation, blood in stool and melena.  Genitourinary: Negative for dysuria, urgency, frequency and hematuria.  Musculoskeletal:  Negative for myalgias, joint pain and falls.  Skin: Negative for itching and rash.  Neurological: Negative for dizziness, tingling, tremors, sensory change, speech change, focal weakness, seizures, loss of consciousness, weakness and headaches.  Endo/Heme/Allergies: Does not bruise/bleed easily.  Psychiatric/Behavioral: Negative for depression, suicidal ideas, memory loss and substance abuse. The patient is not nervous/anxious and does not have insomnia.   14 point review of systems was performed and is negative except as detailed under history of present illness and above  PHYSICAL EXAMINATION  ECOG PERFORMANCE STATUS: 0 - Asymptomatic  Filed Vitals:   05/31/15 1015  BP: 146/73  Pulse: 50  Temp: 97.6 F (36.4 C)  Resp: 15    Physical Exam  Constitutional: He is oriented to person, place, and time and well-developed, well-nourished, and in no distress.  HENT:  Head: Normocephalic and atraumatic.  Nose: Nose normal.  Mouth/Throat: Oropharynx is clear and moist. No oropharyngeal exudate.  Eyes: Conjunctivae and EOM are normal. Pupils are equal, round, and reactive to light. Right eye exhibits no discharge. Left eye exhibits no discharge. No scleral icterus.  Neck: Normal range of motion. Neck supple. No tracheal deviation present. No thyromegaly present.  Cardiovascular: Normal rate, regular rhythm and normal heart sounds.  Exam reveals no gallop and no friction rub.   No murmur heard. Pulmonary/Chest: Effort normal and breath sounds normal. He has no wheezes. He has no rales.  Abdominal: Soft. Bowel sounds are normal. He exhibits no distension and no mass. There is no tenderness. There is no rebound and no guarding.  Musculoskeletal: Normal range of motion. He exhibits no edema.  Lymphadenopathy:    He has no cervical adenopathy.  Neurological: He is alert and oriented to person, place, and time. He has normal reflexes. No cranial nerve deficit. Gait normal. Coordination normal.    Skin: Skin is warm and dry. No rash noted.  Psychiatric: Mood, memory, affect and judgment normal.  Nursing note and vitals reviewed.   LABORATORY DATA:  CBC    Component Value Date/Time   WBC 4.6 05/31/2015 0958   RBC 4.72 05/31/2015 0958   HGB 13.5 05/31/2015 0958   HCT 40.5 05/31/2015 0958   HCT 41 09/02/2012 1056   PLT 150 05/31/2015 0958   MCV 85.8 05/31/2015 0958   MCV 86.4 09/02/2012 1056   MCH 28.6 05/31/2015 0958   MCHC 33.3 05/31/2015 0958   RDW 13.7 05/31/2015 0958   LYMPHSABS 1.5 05/31/2015 0958   MONOABS 0.4 05/31/2015 0958   EOSABS 0.2 05/31/2015 0958   BASOSABS 0.0 05/31/2015 0958   CMP     Component Value Date/Time   NA 140 05/31/2015 0958   NA 140 09/02/2012 1058   K 4.1 05/31/2015 0958   K 4.2 09/02/2012 1058   CL 106 05/31/2015 0958   CL 105 09/02/2012 1058   CO2 28 05/31/2015 0958   GLUCOSE 100* 05/31/2015 0958   BUN 11 05/31/2015 0958   BUN 10 09/02/2012 1058   CREATININE 0.90 05/31/2015 0958   CREATININE 0.79 09/02/2012 1058   CALCIUM 9.2 05/31/2015 0958  CALCIUM 9.6 09/02/2012 1058   PROT 7.2 05/31/2015 0958   PROT 7.0 09/02/2012 1058   ALBUMIN 4.1 05/31/2015 0958   AST 30 05/31/2015 0958   AST 24 09/02/2012 1058   ALT 27 05/31/2015 0958   ALKPHOS 73 05/31/2015 0958   ALKPHOS 76 09/02/2012 1058   BILITOT 0.7 05/31/2015 0958   BILITOT 0.6 09/02/2012 1058   GFRNONAA >60 05/31/2015 0958   GFRAA >60 05/31/2015 0958    RADIOGRAPHIC STUDIES: I have reviewed the images listed below and agree with the results  CLINICAL DATA: Initial evaluation for mild cough.  EXAM: PORTABLE CHEST - 1 VIEW  COMPARISON: Prior radiograph from 03/10/2015  FINDINGS: There is accentuation of the cardiac silhouette, likely related AP technique and shallow lung inflation. Mediastinal silhouette within normal limits.  The lungs are hypoinflated with secondary bronchovascular crowding. No definite focal infiltrates identified. No pulmonary edema  or pleural effusion. No pneumothorax.  No acute osseus abnormality.  IMPRESSION: Shallow lung inflation with secondary bibasilar bronchovascular crowding and/ or atelectasis. No definite active cardiopulmonary disease identified.   Electronically Signed  By: Jeannine Boga M.D.  On: 03/14/2015 23:44     PATHOLOGY: AGNOSIS Diagnosis 11/01/12 1. Colon, biopsy, lipoma at hepatic flexure and rectum - SUBMUCOSAL CARCINOID TUMOR. - SUBMUCOSAL MATURE ADIPOSE TISSUE. PLEASE SEE COMMENT. 2. Colon, polyp(s), sigmoid and rectal - HYPERPLASTIC POLYP(S). NO ADENOMATOUS CHANGE OR MALIGNANCY IDENTIFIED. 3. Duodenum, NOS biopsy - BENIGN SMALL BOWEL MUCOSA. NO VILLOUS ATROPHY, INFLAMMATION OR OTHER ABNORMALITIES PRESENT. 4. Stomach, biopsy - GASTRIC BODY AND ANTRAL TYPE MUCOSA WITH ASSOCIATED MINIMAL CHRONIC INFLAMMATION AND REACTIVE CHANGES. - NO EVIDENCE OF HELICOBACTER PYLORI, INTESTINAL METAPLASIA, DYSPLASIA OR MALIGNANCY. Microscopic Comment 1. There are clusters and nests of neoplastic cells with uniform nuclei and inconspicuous nucleoli. There is no evidence of necrosis or significant mitotic activity identified in this material. The overall morphologic features are diagnostic for carcinoid tumor. The tumor involves the biopsy edges. Clinical correlation is recommended. Case was discussed with Dr. Oneida Cunningham on 11/04/12. 3. There is small bowel mucosa with normal villous architecture and no objective increase in inflammation. No villous atrophy, active inflammation or other significant changes identified. 4. A Warthin-Starry stain is performed to determine the possibility of the presence of Helicobacter pylori. The Warthin-Starry stain is negative for organisms of Helicobacter pylori. (HCL:eps 11/04/12) Aldona Bar MD Pathologist, Electronic Signature (Case signed 11/04/2012) Specimen Gross and Clinical Information FINAL DIAGNOSIS Diagnosis  12/11/2012 Rectum,  polyp(s) - LOW GRADE, WELL DIFFERENTIATED NEUROENDOCRINE TUMOR (CARCINOID) (0.8 CM). - TUMOR FOCALLY EXTENDS TO CAUTERIZED TISSUE EDGE/POLYPECTOMY MARGIN. - SEE COMMENT. Microscopic Comment The morphology is diagnostic of carcinoid tumor. There are no atypical findings identified. Although the tumor is almost entirely excised, there are a few nests of tumor that are focally present at the cauterized tissue edge/polypectomy margin. (CR:kh 12-12-12) Mali RUND DO Pathologist, Electronic   ASSESSMENT and THERAPY PLAN:   CARCINOID  64 year old male with carcinoid found on a biopsy during colonoscopy in 2013. He has just undergone a repeat colonoscopy in April with no abnormality noted. He underwent an excision of the neuroendocrine tumor with Dr. Paulita Cunningham in January 2014. Tumor extended to the cauterized tissue edge and polypectomy margin. He has no obvious evidence of progressive disease/recurrence. Laboratory studies today are pending: specifically chromogranin A and serotonin. I advised the patient will notify him of his laboratory study results when they become available. I will see him back again in 4 months with repeat labs.   We will schedule for  follow up in 4 months with repeat labs.  All questions were answered. The patient knows to call the clinic with any problems, questions or concerns. We can certainly see the patient much sooner if necessary. This note was electronically signed.  This document serves as a record of services personally performed by Ancil Linsey, MD. It was created on her behalf by Arlyce Harman, a trained medical scribe. The creation of this record is based on the scribe's personal observations and the provider's statements to them. This document has been checked and approved by the attending provider.  I have reviewed the above documentation for accuracy and completeness, and I agree with the above.  Molli Hazard, MD

## 2015-05-31 NOTE — Progress Notes (Signed)
Labs drawn

## 2015-06-01 LAB — CHROMOGRANIN A: CHROMOGRANIN A: 1 nmol/L (ref 0–5)

## 2015-06-02 LAB — SEROTONIN SERUM: Serotonin, Serum: 106 ng/mL (ref 21–321)

## 2015-10-01 ENCOUNTER — Ambulatory Visit (HOSPITAL_COMMUNITY): Payer: Medicare HMO | Admitting: Hematology & Oncology

## 2015-10-01 ENCOUNTER — Encounter (HOSPITAL_COMMUNITY): Payer: Medicare HMO | Attending: Hematology & Oncology

## 2015-10-01 DIAGNOSIS — D3A026 Benign carcinoid tumor of the rectum: Secondary | ICD-10-CM | POA: Diagnosis not present

## 2015-10-01 LAB — COMPREHENSIVE METABOLIC PANEL
ALT: 36 U/L (ref 17–63)
ANION GAP: 8 (ref 5–15)
AST: 36 U/L (ref 15–41)
Albumin: 4.6 g/dL (ref 3.5–5.0)
Alkaline Phosphatase: 83 U/L (ref 38–126)
BILIRUBIN TOTAL: 0.8 mg/dL (ref 0.3–1.2)
BUN: 12 mg/dL (ref 6–20)
CHLORIDE: 103 mmol/L (ref 101–111)
CO2: 28 mmol/L (ref 22–32)
Calcium: 9.5 mg/dL (ref 8.9–10.3)
Creatinine, Ser: 0.96 mg/dL (ref 0.61–1.24)
Glucose, Bld: 103 mg/dL — ABNORMAL HIGH (ref 65–99)
Potassium: 3.6 mmol/L (ref 3.5–5.1)
Sodium: 139 mmol/L (ref 135–145)
TOTAL PROTEIN: 7.8 g/dL (ref 6.5–8.1)

## 2015-10-01 LAB — CBC WITH DIFFERENTIAL/PLATELET
Basophils Absolute: 0 10*3/uL (ref 0.0–0.1)
Basophils Relative: 1 %
EOS PCT: 8 %
Eosinophils Absolute: 0.4 10*3/uL (ref 0.0–0.7)
HEMATOCRIT: 41.2 % (ref 39.0–52.0)
Hemoglobin: 13.9 g/dL (ref 13.0–17.0)
LYMPHS PCT: 26 %
Lymphs Abs: 1.4 10*3/uL (ref 0.7–4.0)
MCH: 30 pg (ref 26.0–34.0)
MCHC: 33.7 g/dL (ref 30.0–36.0)
MCV: 89 fL (ref 78.0–100.0)
MONO ABS: 0.4 10*3/uL (ref 0.1–1.0)
Monocytes Relative: 8 %
Neutro Abs: 3.1 10*3/uL (ref 1.7–7.7)
Neutrophils Relative %: 57 %
Platelets: 169 10*3/uL (ref 150–400)
RBC: 4.63 MIL/uL (ref 4.22–5.81)
RDW: 12.9 % (ref 11.5–15.5)
WBC: 5.4 10*3/uL (ref 4.0–10.5)

## 2015-10-01 NOTE — Progress Notes (Signed)
..  Tim Cunningham's reason for visit today is for labs as scheduled per MD orders.  Venipuncture performed with a 23 gauge butterfly needle to R Antecubital.  [name] tolerated procedure well and without incident; questions were answered and patient was discharged.

## 2015-10-04 LAB — CHROMOGRANIN A: Chromogranin A: 2 nmol/L (ref 0–5)

## 2015-10-05 ENCOUNTER — Encounter: Payer: Self-pay | Admitting: Gastroenterology

## 2015-10-05 LAB — SEROTONIN SERUM: Serotonin, Serum: 149 ng/mL (ref 21–321)

## 2015-10-20 ENCOUNTER — Encounter (HOSPITAL_BASED_OUTPATIENT_CLINIC_OR_DEPARTMENT_OTHER): Payer: Medicare HMO | Admitting: Hematology & Oncology

## 2015-10-20 ENCOUNTER — Encounter (HOSPITAL_COMMUNITY): Payer: Self-pay | Admitting: Hematology & Oncology

## 2015-10-20 VITALS — BP 149/77 | HR 53 | Temp 98.2°F | Resp 18 | Wt 196.4 lb

## 2015-10-20 DIAGNOSIS — D3A026 Benign carcinoid tumor of the rectum: Secondary | ICD-10-CM

## 2015-10-20 DIAGNOSIS — Z23 Encounter for immunization: Secondary | ICD-10-CM

## 2015-10-20 MED ORDER — INFLUENZA VAC SPLIT QUAD 0.5 ML IM SUSY
0.5000 mL | PREFILLED_SYRINGE | Freq: Once | INTRAMUSCULAR | Status: AC
  Administered 2015-10-20: 0.5 mL via INTRAMUSCULAR
  Filled 2015-10-20: qty 0.5

## 2015-10-20 NOTE — Progress Notes (Signed)
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 Paulita Fujita however with tumor nests seen at the resection border.  Dr.Fields for followup.  CURRENT THERAPY: Observation  INTERVAL HISTORY: Tim Cunningham 64 y.o. male returns for follow-up of the diagnosis of carcinoid found on a screening colonoscopy. He has no complaints today. He describes his appetite as good. His bowel movements are normal. His energy level is excellent. He just had a screening colonoscopy with Dr. Sydell Axon in April 2016. He was noted to have pancolonic diverticulosis.  Tim Cunningham is here alone today. He says everything is going well. He mentions that he has some aches and pains, indicating his joints, but nothing outside of the range of normal aging. He denies any chest pain, breathing problems, or diarrhea, and he does not have any questions.  He said for Thanksgiving he overate too much, and remarks that his family is doing very good.  He would like a flu shot today.  He denies nausea and vomiting, constipation, diarrhea, change in appetite or energy level.   MEDICAL HISTORY: Past Medical History  Diagnosis Date  . Hyperlipidemia   . GERD (gastroesophageal reflux disease)   . 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 Lower GI bleeding APR 2016 TCS RMR    DUE TO CECAL DIVERTICULUM REQUIRED CLIPS/EPI    has Umbilical hernia without obstruction or gangrene; Heme positive stool; Carcinoid tumor of rectum; Colon cancer screening; Lower GI bleeding DUE TO DIVERTICULUM; GIB (gastrointestinal bleeding); Acute blood loss anemia; Diverticulosis of colon with hemorrhage; and Hyperlipidemia on his problem list.     has No Known Allergies.  Tim Cunningham does not currently have medications on file.  SURGICAL HISTORY: Past Surgical History  Procedure Laterality Date  . Tonsillectomy    . Colonoscopy   11/01/2012    Dr. Oneida Cunningham: SML IH, RECTAL CARCINOID, HYPEPRPLASTIC POLYPS(2), Moderate TICS/Small lipoma IN HF  . Esophagogastroduodenoscopy  11/01/2012    Dr. Fields:Schatzki ring was found/MILD GASTRITIS/DUODENIIS  . Eus  12/11/2012    RECTAL CARCINOID EXCISED-MARGINS NOT CLEAR  . Flexible sigmoidoscopy  Jan 2016    WITH EUS. Dr. Paulita Fujita. NO residual lesion identified  . Colonoscopy N/A 03/15/2015    RMR: Pancolonic diverticulosis. Arterial bleeding arising from a cecal diverticulum status post hemostasis clip placement and injection therapy . Stie of prior rectal mucosal tattooing identified with no evidence of recurrent carcinoid tumor. No EGD done today,.    SOCIAL HISTORY: Social History   Social History  . Marital Status: Divorced    Spouse Name: N/A  . Number of Children: 5  . Years of Education: N/A   Occupational History  . unemployed    Social History Main Topics  . Smoking status: Former Smoker -- 0.50 packs/day for 10 years    Types: Cigarettes    Quit date: 11/01/1981  . Smokeless tobacco: Never Used     Comment: quit 1999  . Alcohol Use: Yes     Comment: 1-2 drinks a month  . Drug Use: No  . Sexual Activity: Yes    Birth Control/ Protection: None   Other Topics Concern  . Not on file   Social History Narrative    FAMILY HISTORY: Family History  Problem Relation Age of Onset  . Colon cancer Other     maternal great uncle  . Lung cancer Other  uncles  . Cancer Mother   . Cancer Maternal Uncle     Review of Systems  Constitutional: Negative for fever, chills, weight loss and malaise/fatigue.  HENT: Negative for congestion, hearing loss, nosebleeds, sore throat and tinnitus.   Eyes: Negative for blurred vision, double vision, pain and discharge.  Respiratory: Negative for cough, hemoptysis, sputum production, shortness of breath and wheezing.   Cardiovascular: Negative for chest pain, palpitations, claudication, leg swelling and PND.    Gastrointestinal: Negative for heartburn, nausea, vomiting, abdominal pain, diarrhea, constipation, blood in stool and melena.  Genitourinary: Negative for dysuria, urgency, frequency and hematuria.  Musculoskeletal: Negative for myalgias, joint pain and falls.  Skin: Negative for itching and rash.  Neurological: Negative for dizziness, tingling, tremors, sensory change, speech change, focal weakness, seizures, loss of consciousness, weakness and headaches.  Endo/Heme/Allergies: Does not bruise/bleed easily.  Psychiatric/Behavioral: Negative for depression, suicidal ideas, memory loss and substance abuse. The patient is not nervous/anxious and does not have insomnia.   14 point review of systems was performed and is negative except as detailed under history of present illness and above   PHYSICAL EXAMINATION  ECOG PERFORMANCE STATUS: 0 - Asymptomatic  Filed Vitals:   10/20/15 1426  BP: 149/77  Pulse: 53  Temp: 98.2 F (36.8 C)  Resp: 18    Physical Exam  Constitutional: He is oriented to person, place, and time and well-developed, well-nourished, and in no distress.  HENT:  Head: Normocephalic and atraumatic.  Nose: Nose normal.  Mouth/Throat: Oropharynx is clear and moist. No oropharyngeal exudate.  Eyes: Conjunctivae and EOM are normal. Pupils are equal, round, and reactive to light. Right eye exhibits no discharge. Left eye exhibits no discharge. No scleral icterus.  Neck: Normal range of motion. Neck supple. No tracheal deviation present. No thyromegaly present.  Cardiovascular: Normal rate, regular rhythm and normal heart sounds.  Exam reveals no gallop and no friction rub.   No murmur heard. Pulmonary/Chest: Effort normal and breath sounds normal. He has no wheezes. He has no rales.  Abdominal: Soft. Bowel sounds are normal. He exhibits no distension and no mass. There is no tenderness. There is no rebound and no guarding.  Musculoskeletal: Normal range of motion. He  exhibits no edema.  Lymphadenopathy:    He has no cervical adenopathy.  Neurological: He is alert and oriented to person, place, and time. He has normal reflexes. No cranial nerve deficit. Gait normal. Coordination normal.  Skin: Skin is warm and dry. No rash noted.  Psychiatric: Mood, memory, affect and judgment normal.  Nursing note and vitals reviewed.   LABORATORY DATA: I have reviewed the data as listed.  CBC    Component Value Date/Time   WBC 5.4 10/01/2015 0950   RBC 4.63 10/01/2015 0950   HGB 13.9 10/01/2015 0950   HCT 41.2 10/01/2015 0950   HCT 41 09/02/2012 1056   PLT 169 10/01/2015 0950   MCV 89.0 10/01/2015 0950   MCV 86.4 09/02/2012 1056   MCH 30.0 10/01/2015 0950   MCHC 33.7 10/01/2015 0950   RDW 12.9 10/01/2015 0950   LYMPHSABS 1.4 10/01/2015 0950   MONOABS 0.4 10/01/2015 0950   EOSABS 0.4 10/01/2015 0950   BASOSABS 0.0 10/01/2015 0950   CMP     Component Value Date/Time   NA 139 10/01/2015 0950   NA 140 09/02/2012 1058   K 3.6 10/01/2015 0950   K 4.2 09/02/2012 1058   CL 103 10/01/2015 0950   CL 105 09/02/2012 1058  CO2 28 10/01/2015 0950   GLUCOSE 103* 10/01/2015 0950   BUN 12 10/01/2015 0950   BUN 10 09/02/2012 1058   CREATININE 0.96 10/01/2015 0950   CREATININE 0.79 09/02/2012 1058   CALCIUM 9.5 10/01/2015 0950   CALCIUM 9.6 09/02/2012 1058   PROT 7.8 10/01/2015 0950   PROT 7.0 09/02/2012 1058   ALBUMIN 4.6 10/01/2015 0950   ALBUMIN 5.3 09/02/2012 1058   AST 36 10/01/2015 0950   AST 24 09/02/2012 1058   ALT 36 10/01/2015 0950   ALKPHOS 83 10/01/2015 0950   ALKPHOS 76 09/02/2012 1058   BILITOT 0.8 10/01/2015 0950   BILITOT 0.6 09/02/2012 1058   GFRNONAA >60 10/01/2015 0950   GFRAA >60 10/01/2015 0950   Results for BAILEY, WALDECKER (MRN [id number])  Ref. Range 05/31/2015 09:58 10/01/2015 09:50  Serotonin, Serum  Latest Ref Range: 21-321 ng/mL 106 149   Results for CHALLIS, BALDINI (MRN [id number])   Ref. Range 05/31/2015 09:58 10/01/2015  09:50  Chromogranin A Latest Ref Range: 0-5 nmol/L 1 2     RADIOGRAPHIC STUDIES: I have reviewed the images listed below and agree with the results  CLINICAL DATA: Initial evaluation for mild cough.  EXAM: PORTABLE CHEST - 1 VIEW  COMPARISON: Prior radiograph from 03/10/2015  FINDINGS: There is accentuation of the cardiac silhouette, likely related AP technique and shallow lung inflation. Mediastinal silhouette within normal limits.  The lungs are hypoinflated with secondary bronchovascular crowding. No definite focal infiltrates identified. No pulmonary edema or pleural effusion. No pneumothorax.  No acute osseus abnormality.  IMPRESSION: Shallow lung inflation with secondary bibasilar bronchovascular crowding and/ or atelectasis. No definite active cardiopulmonary disease identified.   Electronically Signed  By: Jeannine Boga M.D.  On: 03/14/2015 23:44     PATHOLOGY: AGNOSIS Diagnosis 11/01/12 1. Colon, biopsy, lipoma at hepatic flexure and rectum - SUBMUCOSAL CARCINOID TUMOR. - SUBMUCOSAL MATURE ADIPOSE TISSUE. PLEASE SEE COMMENT. 2. Colon, polyp(s), sigmoid and rectal - HYPERPLASTIC POLYP(S). NO ADENOMATOUS CHANGE OR MALIGNANCY IDENTIFIED. 3. Duodenum, NOS biopsy - BENIGN SMALL BOWEL MUCOSA. NO VILLOUS ATROPHY, INFLAMMATION OR OTHER ABNORMALITIES PRESENT. 4. Stomach, biopsy - GASTRIC BODY AND ANTRAL TYPE MUCOSA WITH ASSOCIATED MINIMAL CHRONIC INFLAMMATION AND REACTIVE CHANGES. - NO EVIDENCE OF HELICOBACTER PYLORI, INTESTINAL METAPLASIA, DYSPLASIA OR MALIGNANCY. Microscopic Comment 1. There are clusters and nests of neoplastic cells with uniform nuclei and inconspicuous nucleoli. There is no evidence of necrosis or significant mitotic activity identified in this material. The overall morphologic features are diagnostic for carcinoid tumor. The tumor involves the biopsy edges. Clinical correlation is recommended. Case was discussed with  Dr. Oneida Cunningham on 11/04/12. 3. There is small bowel mucosa with normal villous architecture and no objective increase in inflammation. No villous atrophy, active inflammation or other significant changes identified. 4. A Warthin-Starry stain is performed to determine the possibility of the presence of Helicobacter pylori. The Warthin-Starry stain is negative for organisms of Helicobacter pylori. (HCL:eps 11/04/12) Aldona Bar MD Pathologist, Electronic Signature (Case signed 11/04/2012) Specimen Gross and Clinical Information FINAL DIAGNOSIS Diagnosis  12/11/2012 Rectum, polyp(s) - LOW GRADE, WELL DIFFERENTIATED NEUROENDOCRINE TUMOR (CARCINOID) (0.8 CM). - TUMOR FOCALLY EXTENDS TO CAUTERIZED TISSUE EDGE/POLYPECTOMY MARGIN. - SEE COMMENT. Microscopic Comment The morphology is diagnostic of carcinoid tumor. There are no atypical findings identified. Although the tumor is almost entirely excised, there are a few nests of tumor that are focally present at the cauterized tissue edge/polypectomy margin. (CR:kh 12-12-12) Mali RUND DO Pathologist, Electronic   ASSESSMENT and THERAPY PLAN:  CARCINOID  Colonoscopy 03/15/2015 with pancolonic diverticulosis, no evidence of recurrent carcinoid  63 year old male with carcinoid found on a biopsy during colonoscopy in 2013. He has just undergone a repeat colonoscopy in April with no abnormality noted. He underwent an excision of the neuroendocrine tumor with Dr. Paulita Fujita in January 2014. Tumor extended to the cauterized tissue edge and polypectomy margin. He has no obvious evidence of progressive disease/recurrence. Laboratory studies today are pending: specifically chromogranin A and serotonin. I advised the patient will notify him of his laboratory study results when they become available. I will see him back again in 4 months with repeat labs.   We will schedule for follow up in 6 months with repeat labs.   Orders Placed This Encounter  Procedures  .  CBC with Differential    Standing Status: Future     Number of Occurrences:      Standing Expiration Date: 10/19/2016  . Comprehensive metabolic panel    Standing Status: Future     Number of Occurrences:      Standing Expiration Date: 10/19/2016  . Chromogranin A    Standing Status: Future     Number of Occurrences:      Standing Expiration Date: 10/19/2016  . Serotonin serum    Standing Status: Future     Number of Occurrences:      Standing Expiration Date: 10/19/2016    All questions were answered. The patient knows to call the clinic with any problems, questions or concerns. We can certainly see the patient much sooner if necessary. This note was electronically signed.  This document serves as a record of services personally performed by Ancil Linsey, MD. It was created on her behalf by Toni Amend, a trained medical scribe. The creation of this record is based on the scribe's personal observations and the provider's statements to them. This document has been checked and approved by the attending provider.  I have reviewed the above documentation for accuracy and completeness, and I agree with the above.  Molli Hazard, MD

## 2015-10-20 NOTE — Progress Notes (Signed)
[name] Flu vaccine given today

## 2015-10-20 NOTE — Patient Instructions (Signed)
[place] at [facility] Discharge Instructions  RECOMMENDATIONS MADE BY THE CONSULTANT AND ANY TEST RESULTS WILL BE SENT TO YOUR REFERRING PHYSICIAN.   Exam completed by Dr Whitney Muse today Flu shot today Return to see the doctor in 6 months with labs Please call the clinic if you have any questions or concerns  Thank you for choosing [place] at [facility] to provide your oncology and hematology care.  To afford each patient quality time with our provider, please arrive at least 15 minutes before your scheduled appointment time.    You need to re-schedule your appointment should you arrive 10 or more minutes late.  We strive to give you quality time with our providers, and arriving late affects you and other patients whose appointments are after yours.  Also, if you no show three or more times for appointments you may be dismissed from the clinic at the providers discretion.     Again, thank you for choosing [facility].  Our hope is that these requests will decrease the amount of time that you wait before being seen by our physicians.       _____________________________________________________________  Should you have questions after your visit to [facility], please contact our office at [phone] between the hours of 8:30 a.m. and 4:30 p.m.  Voicemails left after 4:30 p.m. will not be returned until the following business day.  For prescription refill requests, have your pharmacy contact our office.

## 2015-11-05 NOTE — Progress Notes (Signed)
This encounter was created in error - please disregard.

## 2015-11-17 ENCOUNTER — Ambulatory Visit (INDEPENDENT_AMBULATORY_CARE_PROVIDER_SITE_OTHER): Payer: Medicare HMO | Admitting: Gastroenterology

## 2015-11-17 ENCOUNTER — Encounter: Payer: Self-pay | Admitting: Gastroenterology

## 2015-11-17 VITALS — BP 146/75 | HR 59 | Temp 97.4°F | Ht 67.0 in | Wt 196.0 lb

## 2015-11-17 DIAGNOSIS — D3A026 Benign carcinoid tumor of the rectum: Secondary | ICD-10-CM | POA: Diagnosis not present

## 2015-11-17 NOTE — Progress Notes (Signed)
Referring Provider: Raiford Simmonds., [name] Primary Care Physician:  Raiford Simmonds., [name]  Primary GI: Dr. Oneida Alar   Chief Complaint  Patient presents with  . Follow-up    HPI:   Tim Cunningham is a 64 y.o. male presenting today with a history of rectal carcinoid tumor s/p transanal resection by Dr. Paulita Fujita in Jan 2014. Tumor nests were seen at the resection border, but the plan was to follow periodically with endoscopic evaluations and not pursue surgical intervention. Earlier this year had a diverticular bleed requiring clip placement and epi. No evidence of recurrent carcinoid tumor.   Denies any rectal bleeding, abdominal pain, constipation. No N/V. Taking aspirin every other day.   Past Medical History  Diagnosis Date  . Hyperlipidemia   . GERD (gastroesophageal reflux disease)   . 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 Lower GI bleeding APR 2016 TCS RMR    DUE TO CECAL DIVERTICULUM REQUIRED CLIPS/EPI    Past Surgical History  Procedure Laterality Date  . Tonsillectomy    . Colonoscopy  11/01/2012    Dr. Oneida Alar: SML IH, RECTAL CARCINOID, HYPEPRPLASTIC POLYPS(2), Moderate TICS/Small lipoma IN HF  . Esophagogastroduodenoscopy  11/01/2012    Dr. Fields:Schatzki ring was found/MILD GASTRITIS/DUODENIIS  . Eus  12/11/2012    RECTAL CARCINOID EXCISED-MARGINS NOT CLEAR  . Flexible sigmoidoscopy  Jan 2016    WITH EUS. Dr. Paulita Fujita. NO residual lesion identified  . Colonoscopy N/A 03/15/2015    RMR: Pancolonic diverticulosis. Arterial bleeding arising from a cecal diverticulum status post hemostasis clip placement and injection therapy . Stie of prior rectal mucosal tattooing identified with no evidence of recurrent carcinoid tumor. No EGD done today,.    Current Outpatient Prescriptions  Medication Sig Dispense Refill  . albuterol (PROVENTIL HFA;VENTOLIN HFA) 108 (90 BASE) MCG/ACT inhaler Inhale 2 puffs into the lungs every 4 (four) hours as needed for wheezing or shortness of  breath. 1 Inhaler 0  . aspirin EC 81 MG tablet Take 1 tablet (81 mg total) by mouth every other day. Resume in 1 week    . azelastine (ASTELIN) 137 MCG/SPRAY nasal spray Place 1 spray into the nose daily as needed. For allergies.    Marland Kitchen loratadine (CLARITIN) 10 MG tablet Take 10 mg by mouth daily as needed. For allergies.    . Multiple Vitamin (MULTIVITAMIN WITH MINERALS) TABS Take 1 tablet by mouth every morning.    Marland Kitchen omeprazole (PRILOSEC) 20 MG capsule Take 1 capsule (20 mg total) by mouth daily before breakfast. 30 capsule 5  . pravastatin (PRAVACHOL) 20 MG tablet Take 20 mg by mouth at bedtime.     . hydrocortisone (ANUSOL-HC) 25 MG suppository Place 1 suppository (25 mg total) rectally 2 (two) times daily. (Patient not taking: Reported on 11/17/2015) 6 suppository 0   No current facility-administered medications for this visit.    Allergies as of 11/17/2015  . (No Known Allergies)    Family History  Problem Relation Age of Onset  . Colon cancer Other     maternal great uncle  . Lung cancer Other     uncles  . Cancer Mother   . Cancer Maternal Uncle     Social History   Social History  . Marital Status: Divorced    Spouse Name: N/A  . Number of Children: 5  . Years of Education: N/A   Occupational History  . unemployed    Social History Main Topics  . Smoking status: Former Smoker -- 0.50  packs/day for 10 years    Types: Cigarettes    Quit date: 11/01/1981  . Smokeless tobacco: Never Used     Comment: quit 1999  . Alcohol Use: Yes     Comment: 1-2 drinks a month  . Drug Use: No  . Sexual Activity: Yes    Birth Control/ Protection: None   Other Topics Concern  . None   Social History Narrative    Review of Systems: Negative unless mentioned in HPI   Physical Exam: BP 146/75 mmHg  Pulse 59  Temp(Src) 97.4 F (36.3 C) (Oral)  Ht 5\' 7"  (1.702 m)  Wt 196 lb (88.905 kg)  BMI 30.69 kg/m2 General:   Alert and oriented. No distress noted. Pleasant and  cooperative.  Head:  Normocephalic and atraumatic. Eyes:  Conjuctiva clear without scleral icterus. Abdomen:  +BS, soft, non-tender and non-distended. No rebound or guarding. Small umbilical hernia noted  Extremities:  Without edema. Neurologic:  Alert and  oriented x4;  grossly normal neurologically. Psych:  Alert and cooperative. Normal mood and affect.

## 2015-11-17 NOTE — Patient Instructions (Signed)
Return in 6 months to see Dr. Oneida Alar.   Please call us if you need anything!  Have a Happy New Year!

## 2015-11-17 NOTE — Assessment & Plan Note (Signed)
Doing well currently and followed closely by Dr. Whitney Muse. Return to see Dr. Oneida Alar in June 2017.

## 2015-11-17 NOTE — Progress Notes (Signed)
cc'ed to pcp °

## 2016-01-10 IMAGING — DX DG CHEST 2V
2 series · 2 of 2 positions shown · non-contrast
Comparison: None.

CLINICAL DATA: Nasal congestion and cough for 2 weeks. Subjective
fever for several days. History of rectal cancer.

EXAM:
CHEST  2 VIEW

[chest pa]
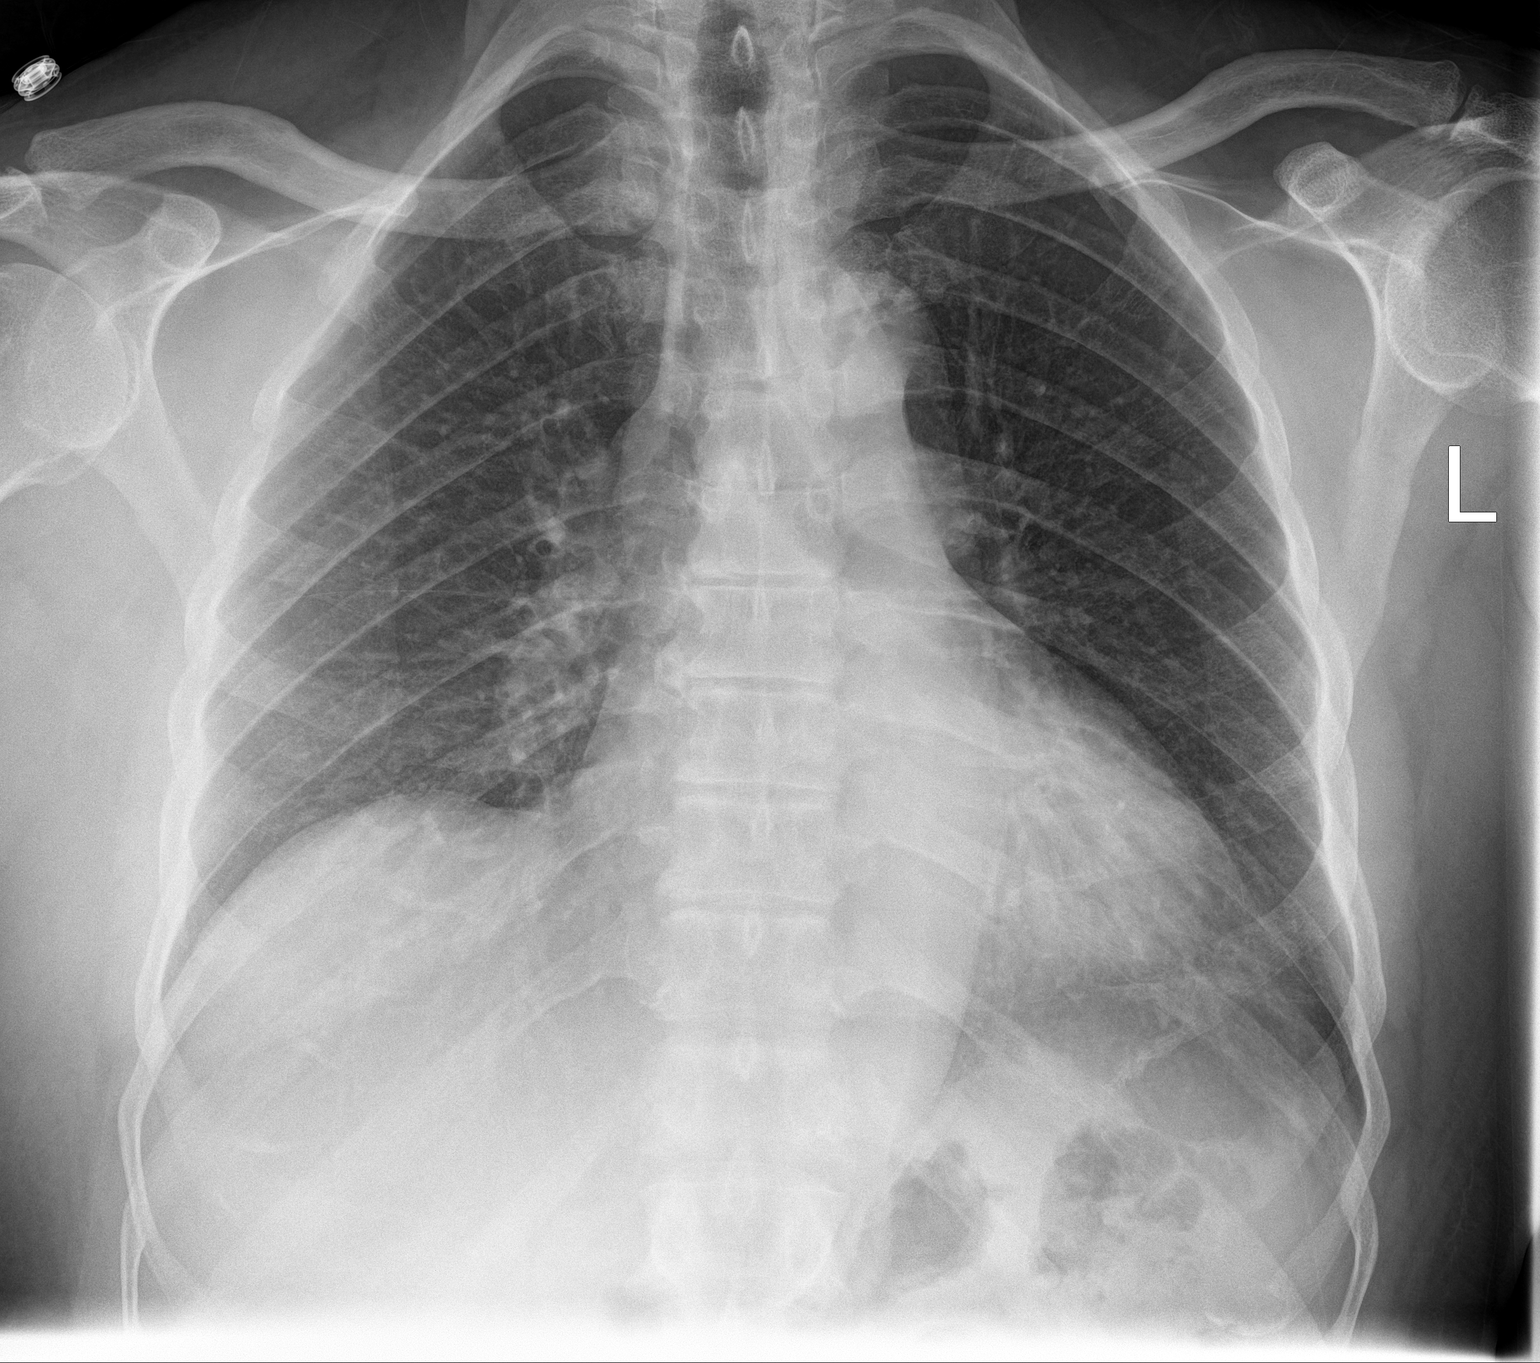

[chest lat]
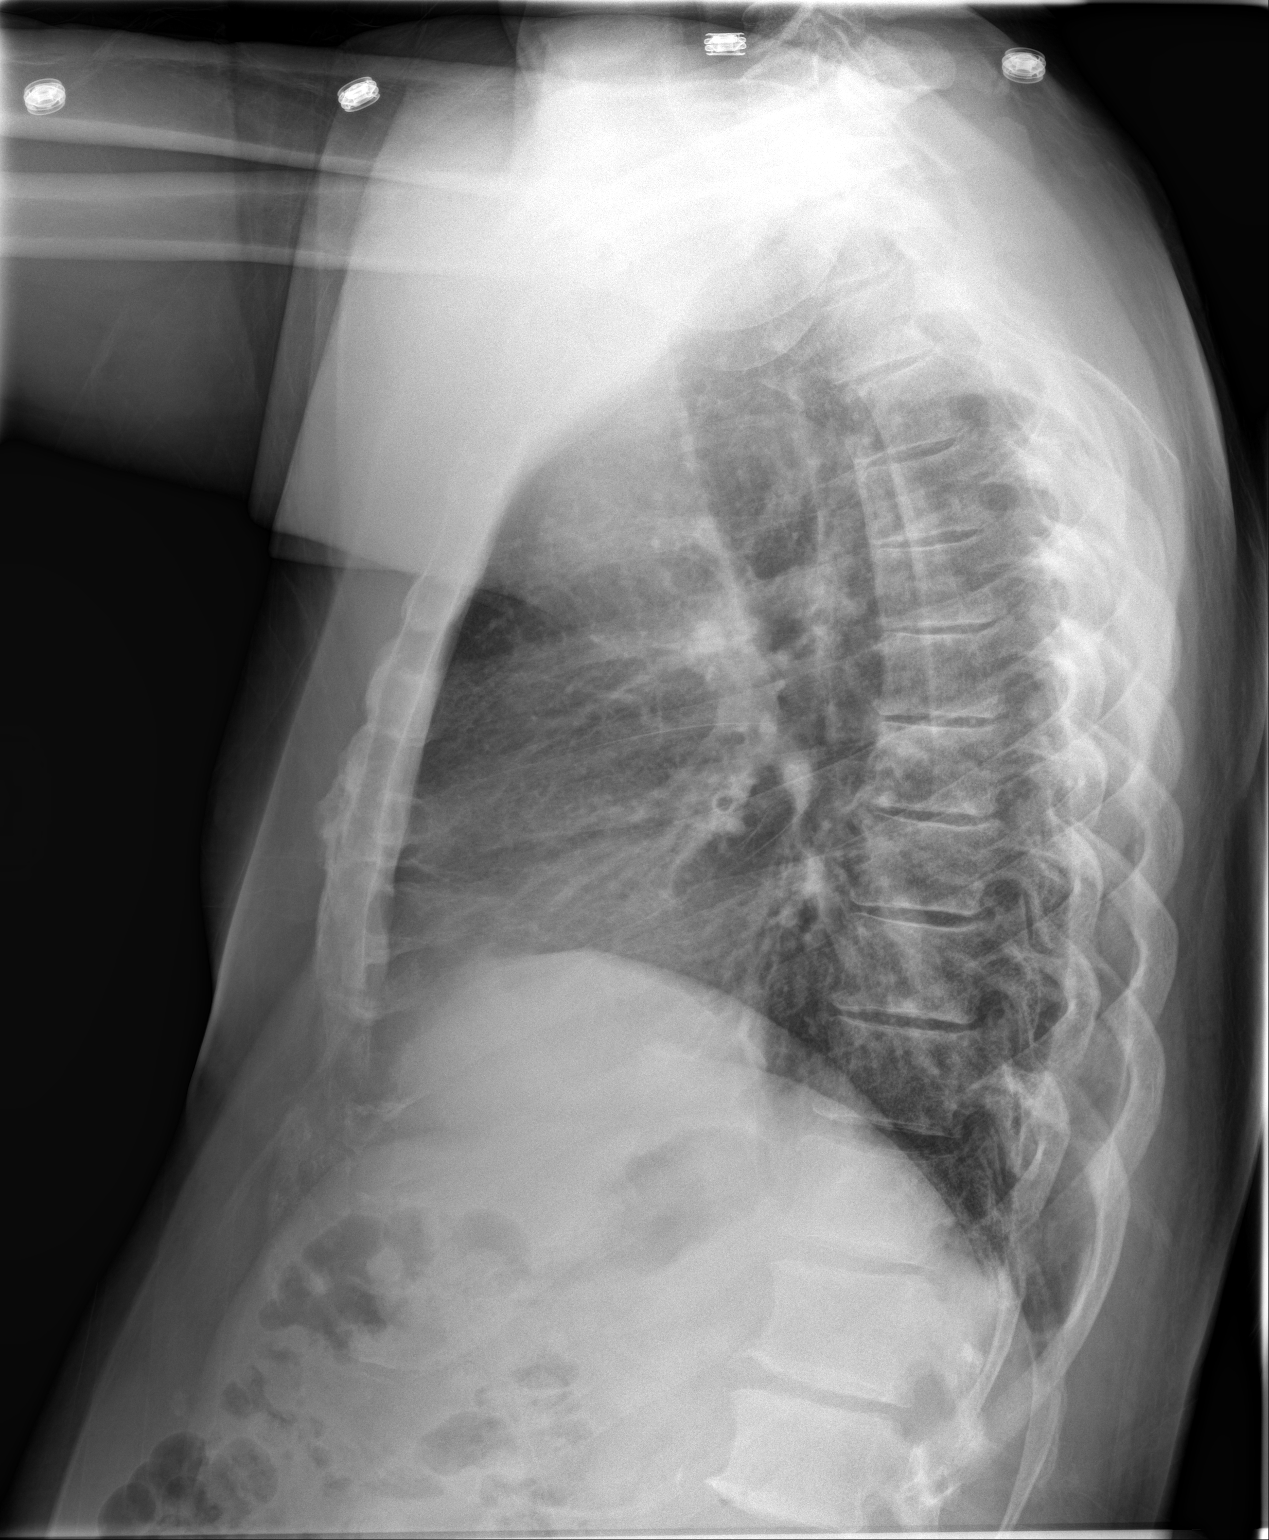

[2 of 2 positions shown; findings below may reference images not displayed]

FINDINGS: Cardiac silhouette is upper limits of normal in size, mediastinal
silhouette is nonsuspicious. Mild bronchitic changes without pleural
effusion or focal consolidation. No pneumothorax. Soft tissue planes
and included osseous structures are nonsuspicious, mild degenerative
change of the thoracic spine.
IMPRESSION: Borderline cardiomegaly. Mild bronchitic changes without focal
consolidation.

  By: Elbby Adisin

## 2016-01-29 ENCOUNTER — Other Ambulatory Visit: Payer: Self-pay | Admitting: Gastroenterology

## 2016-01-31 ENCOUNTER — Other Ambulatory Visit: Payer: Self-pay

## 2016-02-20 IMAGING — CR DG CHEST 1V PORT
1 series · 2 of 2 positions shown · non-contrast
Comparison: Prior radiograph from 03/10/2015

CLINICAL DATA: Initial evaluation for mild cough.

EXAM:
PORTABLE CHEST - 1 VIEW

[Series 1: ap portable · 0.17mm/px · 2 of 2 slices shown]
[im 1/2]
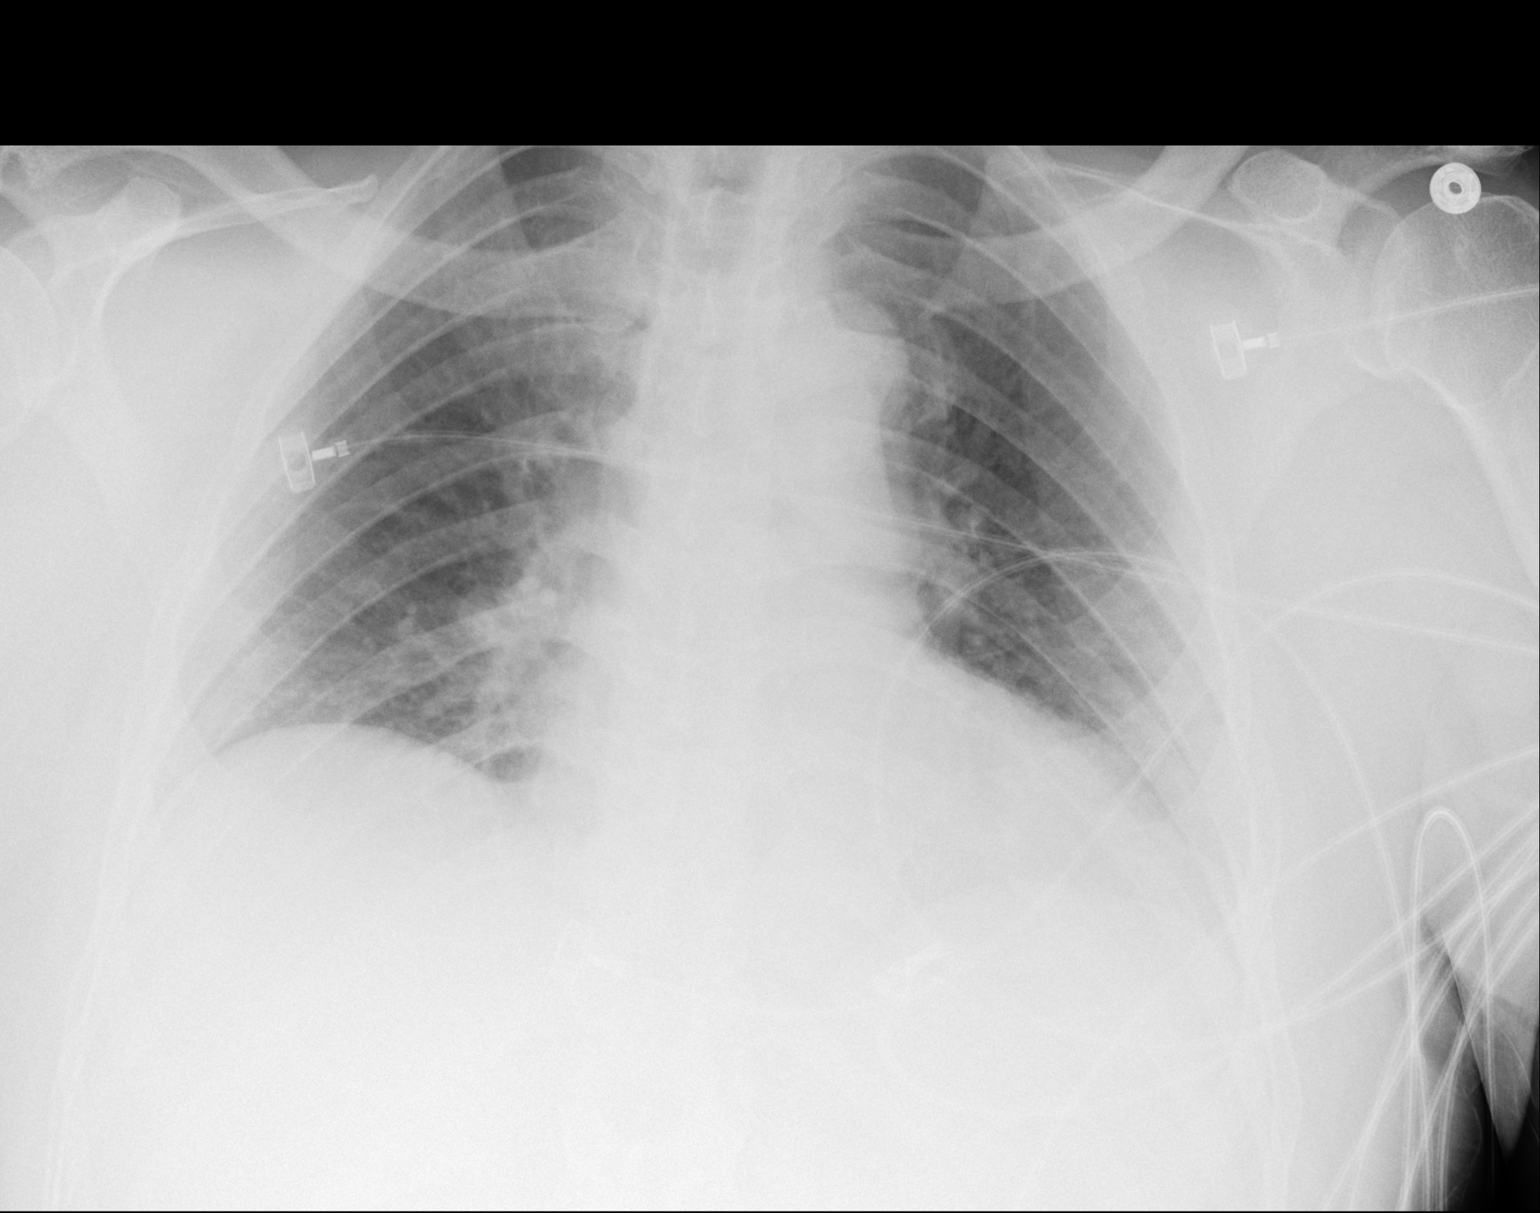
[im 2/2]
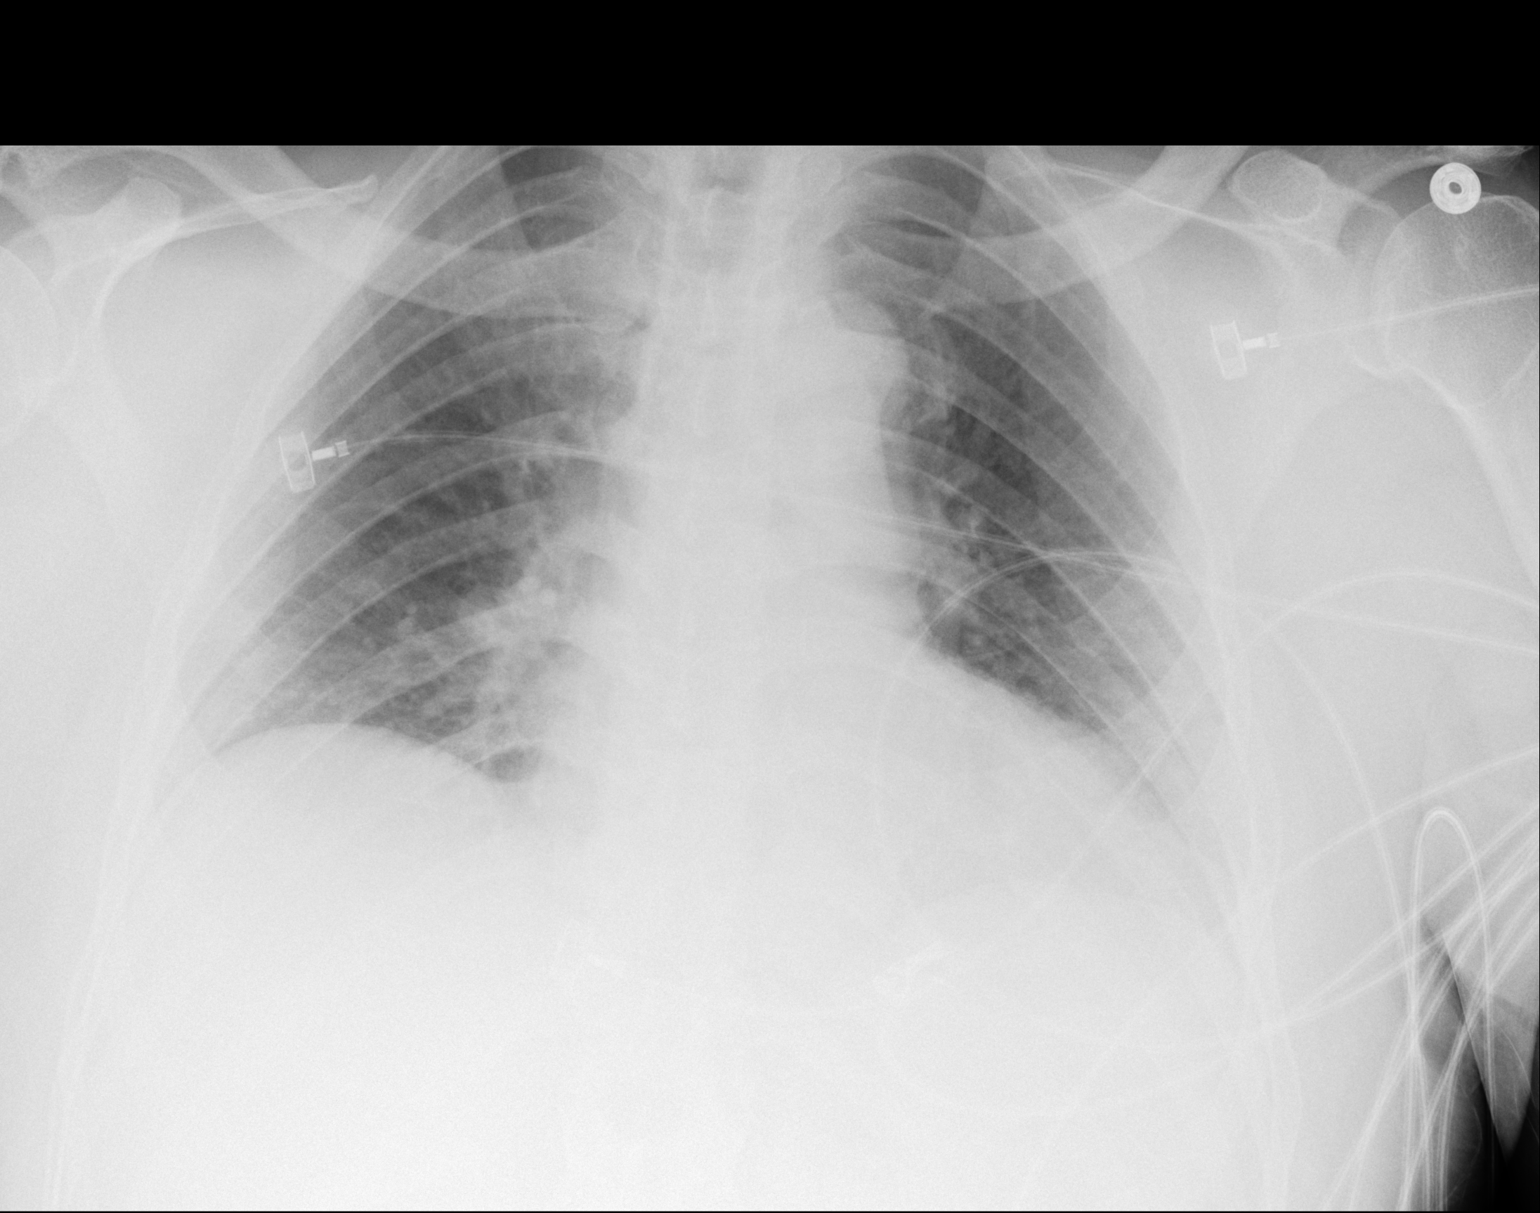

[2 of 2 positions shown; findings below may reference images not displayed]

FINDINGS: There is accentuation of the cardiac silhouette, likely related AP
technique and shallow lung inflation. Mediastinal silhouette within
normal limits.

The lungs are hypoinflated with secondary bronchovascular crowding.
No definite focal infiltrates identified. No pulmonary edema or
pleural effusion. No pneumothorax.

No acute osseus abnormality.
IMPRESSION: Shallow lung inflation with secondary bibasilar bronchovascular
crowding and/ or atelectasis. No definite active cardiopulmonary
disease identified.

## 2016-03-06 ENCOUNTER — Encounter: Payer: Self-pay | Admitting: Gastroenterology

## 2016-03-30 ENCOUNTER — Encounter: Payer: Self-pay | Admitting: Gastroenterology

## 2016-03-30 ENCOUNTER — Ambulatory Visit (INDEPENDENT_AMBULATORY_CARE_PROVIDER_SITE_OTHER): Payer: Medicare HMO | Admitting: Gastroenterology

## 2016-03-30 VITALS — BP 143/77 | HR 61 | Temp 96.5°F | Ht 67.0 in | Wt 195.4 lb

## 2016-03-30 DIAGNOSIS — D3A026 Benign carcinoid tumor of the rectum: Secondary | ICD-10-CM | POA: Diagnosis not present

## 2016-03-30 DIAGNOSIS — K219 Gastro-esophageal reflux disease without esophagitis: Secondary | ICD-10-CM | POA: Diagnosis not present

## 2016-03-30 NOTE — Assessment & Plan Note (Signed)
Continue PPI therapy. 

## 2016-03-30 NOTE — Patient Instructions (Signed)
1. Follow up with Dr. Whitney Muse at [facility] later this month as scheduled.  2. Return to see Dr. Oneida Alar in six months or call sooner if you are having any problems.

## 2016-03-30 NOTE — Progress Notes (Signed)
cc'ed to pcp °

## 2016-03-30 NOTE — Assessment & Plan Note (Signed)
Doing well without any signs or symptoms of return. Patient follows with Dr. Aviva Kluver later this month and encouraged continued follow-up for evaluation labs with her. Return to the office in November 2017 to see Dr. Oneida Alar.

## 2016-03-30 NOTE — Progress Notes (Signed)
      Primary Care Physician: Raiford Simmonds., [name]  Primary Gastroenterologist:  Barney Drain, MD   Chief Complaint  Patient presents with  . Follow-up    HPI: Tim Cunningham is a 65 y.o. 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 He had transanal resection by Dr. Paulita Fujita in January 2014 of a rectal carcinoid tumor. Tumor margins were not clear. Subsequent rectal EUS in 2015 without any evidence recurrent tumor. Colonoscopy in April 2016 for hematochezia showed no evidence recurrent carcinoid tumor, pancolonic diverticulosis with arterial bleeding arising from a cecal diverticulum status post hemostasis clip placement and injection therapy.  Patient is without any complaints. Denies constipation, diarrhea, melena, rectal bleeding, abdominal pain, anorexia, nausea, vomiting, weight loss. Prior history of GERD, patient describes as minimal but is currently well-controlled on PPI therapy. Denies prolonged chronic frequent heartburn for Barrett's esophagus unlikely.  Current Outpatient Prescriptions  Medication Sig Dispense Refill  . albuterol (PROVENTIL HFA;VENTOLIN HFA) 108 (90 BASE) MCG/ACT inhaler Inhale 2 puffs into the lungs every 4 (four) hours as needed for wheezing or shortness of breath. 1 Inhaler 0  . aspirin EC 81 MG tablet Take 1 tablet (81 mg total) by mouth every other day. Resume in 1 week    . azelastine (ASTELIN) 137 MCG/SPRAY nasal spray Place 1 spray into the nose daily as needed. For allergies.    Marland Kitchen loratadine (CLARITIN) 10 MG tablet Take 10 mg by mouth daily as needed. For allergies.    . Multiple Vitamin (MULTIVITAMIN WITH MINERALS) TABS Take 1 tablet by mouth every morning.    Marland Kitchen omeprazole (PRILOSEC) 20 MG capsule TAKE ONE CAPSULE BY MOUTH ONCE DAILY BEFORE BREAKFAST 30 capsule 5  . pravastatin (PRAVACHOL) 20 MG tablet Take 20 mg by mouth at bedtime.      No current facility-administered medications for this visit.    Allergies as of 03/30/2016  .  (No Known Allergies)    ROS:  General: Negative for anorexia, weight loss, fever, chills, fatigue, weakness. ENT: Negative for hoarseness, difficulty swallowing , nasal congestion. CV: Negative for chest pain, angina, palpitations, dyspnea on exertion, peripheral edema.  Respiratory: Negative for dyspnea at rest, dyspnea on exertion, cough, sputum, wheezing.  GI: See history of present illness. GU:  Negative for dysuria, hematuria, urinary incontinence, urinary frequency, nocturnal urination.  Endo: Negative for unusual weight change.    Physical Examination:   BP 143/77 mmHg  Pulse 61  Temp(Src) 96.5 F (35.8 C)  Ht 5\' 7"  (1.702 m)  Wt 195 lb 6.4 oz (88.633 kg)  BMI 30.60 kg/m2  General: Well-nourished, well-developed in no acute distress.  Eyes: No icterus. Mouth: Oropharyngeal mucosa moist and pink , no lesions erythema or exudate. Lungs: Clear to auscultation bilaterally.  Heart: Regular rate and rhythm, no murmurs rubs or gallops.  Abdomen: Bowel sounds are normal, nontender, nondistended, no hepatosplenomegaly or masses, no abdominal bruits or hernia , no rebound or guarding.   Extremities: No lower extremity edema. No clubbing or deformities. Neuro: Alert and oriented x 4   Skin: Warm and dry, no jaundice.   Psych: Alert and cooperative, normal mood and affect.

## 2016-04-18 ENCOUNTER — Encounter (HOSPITAL_COMMUNITY): Payer: Medicare HMO

## 2016-04-18 ENCOUNTER — Encounter (HOSPITAL_COMMUNITY): Payer: Self-pay | Admitting: Hematology & Oncology

## 2016-04-18 ENCOUNTER — Encounter (HOSPITAL_COMMUNITY): Payer: Medicare HMO | Attending: Hematology & Oncology | Admitting: Hematology & Oncology

## 2016-04-18 VITALS — BP 140/69 | HR 48 | Temp 97.5°F | Resp 16 | Wt 197.3 lb

## 2016-04-18 DIAGNOSIS — D3A026 Benign carcinoid tumor of the rectum: Secondary | ICD-10-CM

## 2016-04-18 DIAGNOSIS — D62 Acute posthemorrhagic anemia: Secondary | ICD-10-CM

## 2016-04-18 DIAGNOSIS — D649 Anemia, unspecified: Secondary | ICD-10-CM | POA: Diagnosis not present

## 2016-04-18 DIAGNOSIS — K5731 Diverticulosis of large intestine without perforation or abscess with bleeding: Secondary | ICD-10-CM

## 2016-04-18 LAB — COMPREHENSIVE METABOLIC PANEL
ALK PHOS: 68 U/L (ref 38–126)
ALT: 37 U/L (ref 17–63)
AST: 32 U/L (ref 15–41)
Albumin: 4.2 g/dL (ref 3.5–5.0)
Anion gap: 6 (ref 5–15)
BUN: 14 mg/dL (ref 6–20)
CALCIUM: 9 mg/dL (ref 8.9–10.3)
CHLORIDE: 105 mmol/L (ref 101–111)
CO2: 26 mmol/L (ref 22–32)
Creatinine, Ser: 0.88 mg/dL (ref 0.61–1.24)
Glucose, Bld: 101 mg/dL — ABNORMAL HIGH (ref 65–99)
Potassium: 3.9 mmol/L (ref 3.5–5.1)
Sodium: 137 mmol/L (ref 135–145)
Total Bilirubin: 0.7 mg/dL (ref 0.3–1.2)
Total Protein: 7.2 g/dL (ref 6.5–8.1)

## 2016-04-18 LAB — CBC WITH DIFFERENTIAL/PLATELET
BASOS ABS: 0 10*3/uL (ref 0.0–0.1)
Basophils Relative: 0 %
Eosinophils Absolute: 0.3 10*3/uL (ref 0.0–0.7)
Eosinophils Relative: 4 %
HEMATOCRIT: 38.7 % — AB (ref 39.0–52.0)
HEMOGLOBIN: 12.9 g/dL — AB (ref 13.0–17.0)
LYMPHS PCT: 27 %
Lymphs Abs: 1.7 10*3/uL (ref 0.7–4.0)
MCH: 29.6 pg (ref 26.0–34.0)
MCHC: 33.3 g/dL (ref 30.0–36.0)
MCV: 88.8 fL (ref 78.0–100.0)
Monocytes Absolute: 0.6 10*3/uL (ref 0.1–1.0)
Monocytes Relative: 10 %
Neutro Abs: 3.7 10*3/uL (ref 1.7–7.7)
Neutrophils Relative %: 59 %
Platelets: 176 10*3/uL (ref 150–400)
RBC: 4.36 MIL/uL (ref 4.22–5.81)
RDW: 12.8 % (ref 11.5–15.5)
WBC: 6.3 10*3/uL (ref 4.0–10.5)

## 2016-04-18 NOTE — Patient Instructions (Signed)
[place] at [facility] Discharge Instructions  RECOMMENDATIONS MADE BY THE CONSULTANT AND ANY TEST RESULTS WILL BE SENT TO YOUR REFERRING PHYSICIAN.  Return to center in 6 months.  Lab work prior to arrival.   Thank you for choosing [place] at [facility] to provide your oncology and hematology care.  To afford each patient quality time with our provider, please arrive at least 15 minutes before your scheduled appointment time.   Beginning January 23rd 2017 lab work for the [facility] will be done in the  Main lab at [facility] on 1st floor. If you have a lab appointment with the [facility] please come in thru the  Main Entrance and check in at the main information desk  You need to re-schedule your appointment should you arrive 10 or more minutes late.  We strive to give you quality time with our providers, and arriving late affects you and other patients whose appointments are after yours.  Also, if you no show three or more times for appointments you may be dismissed from the clinic at the providers discretion.     Again, thank you for choosing [facility].  Our hope is that these requests will decrease the amount of time that you wait before being seen by our physicians.       _____________________________________________________________  Should you have questions after your visit to [facility], please contact our office at [phone] between the hours of 8:30 a.m. and 4:30 p.m.  Voicemails left after 4:30 p.m. will not be returned until the following business day.  For prescription refill requests, have your pharmacy contact our office.         Resources For Cancer Patients and their Caregivers ? American Cancer Society: Can assist with transportation, wigs, general needs, runs Look Good Feel Better.        ([phone] ? Cancer Care: Provides financial assistance, online support groups,  medication/co-pay assistance.  1-800-813-HOPE 442-582-3971) ? [place] Assists [place] cancer patients and their families through emotional , educational and financial support.  5062858259 ? [name] Where to apply for food stamps, Medicaid and utility assistance. 680-860-2861 ? [name]: Transportation to medical appointments. 407-521-2192 ? Social Security Administration: May apply for disability if have a Stage IV cancer. 567-825-4822 678-507-5670 ? [employer], Disability and Transit Services: Assists with nutrition, care and transit needs. [facility] Support Programs: @10RELATIVEDAYS @ > Cancer Support Group  2nd Tuesday of the month 1pm-2pm, Journey Room  > Creative Journey  3rd Tuesday of the month 1130am-1pm, Journey Room  > Look Good Feel Better  1st Wednesday of the month 10am-12 noon, Journey Room (Call [place] to register [phone])

## 2016-04-18 NOTE — Progress Notes (Signed)
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 Paulita Fujita however with tumor nests seen at the resection border.  Dr.Fields for followup.  CURRENT THERAPY: Observation  INTERVAL HISTORY: Tim Cunningham 65 y.o. male returns for follow-up of the diagnosis of carcinoid found on a screening colonoscopy. He has no complaints today. He describes his appetite as good. His bowel movements are normal. His energy level is excellent. He just had a screening colonoscopy with Dr. Sydell Axon in April 2016. He was noted to have pancolonic diverticulosis with arterial bleeding arising from a cecal diverticulum status post hemostasis clip placement and injection therapy.  [name] is unaccompanied.  He has been feeling pretty well and has been eating well. His breathing is good and he is sleeping well. He is retired, though he still does yard work.  He denies any pain. He denies leg swelling or abdominal pain.  He does not have another colonoscopy scheduled. He has follow-up with Dr. Oneida Alar this November. His PCP is Dr. Karie Kirks.    MEDICAL HISTORY: Past Medical History  Diagnosis Date  . Hyperlipidemia   . GERD (gastroesophageal reflux disease)   . 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 Lower GI bleeding APR 2016 TCS RMR    DUE TO CECAL DIVERTICULUM REQUIRED CLIPS/EPI    has Umbilical hernia without obstruction or gangrene; Heme positive stool; Carcinoid tumor of rectum; Colon cancer screening; Lower GI bleeding DUE TO DIVERTICULUM; GIB (gastrointestinal bleeding); Acute blood loss anemia; Diverticulosis of colon with hemorrhage; Hyperlipidemia; and GERD (gastroesophageal reflux disease) on his problem list.     has No Known Allergies.  Mr. Omo does not currently have medications on file.  SURGICAL HISTORY: Past Surgical History  Procedure Laterality Date  .  Tonsillectomy    . Colonoscopy  11/01/2012    Dr. Oneida Alar: SML IH, RECTAL CARCINOID, HYPEPRPLASTIC POLYPS(2), Moderate TICS/Small lipoma IN HF  . Esophagogastroduodenoscopy  11/01/2012    Dr. Fields:Schatzki ring was found/MILD GASTRITIS/DUODENIIS  . Eus  12/11/2012    RECTAL CARCINOID EXCISED-MARGINS NOT CLEAR  . Flexible sigmoidoscopy  Jan 2016    WITH EUS. Dr. Paulita Fujita. NO residual lesion identified  . Colonoscopy N/A 03/15/2015    Dr. Gala Romney: Pancolonic diverticulosis. Arterial bleeding arising from a cecal diverticulum status post hemostasis clip placement and injection therapy . Stie of prior rectal mucosal tattooing identified with no evidence of recurrent carcinoid tumor. No EGD done today,.    SOCIAL HISTORY: Social History   Social History  . Marital Status: Divorced    Spouse Name: N/A  . Number of Children: 5  . Years of Education: N/A   Occupational History  . unemployed    Social History Main Topics  . Smoking status: Former Smoker -- 0.50 packs/day for 10 years    Types: Cigarettes    Quit date: 11/01/1981  . Smokeless tobacco: Never Used     Comment: quit 1999  . Alcohol Use: Yes     Comment: 1-2 drinks a month  . Drug Use: No  . Sexual Activity: Yes    Birth Control/ Protection: None   Other Topics Concern  . Not on file   Social History Narrative    FAMILY HISTORY: Family History  Problem Relation Age of Onset  . Colon cancer Other     maternal great uncle  . Lung cancer Other  uncles  . Cancer Mother   . Cancer Maternal Uncle     Review of Systems  Constitutional: Negative for fever, chills, weight loss and malaise/fatigue.  HENT: Negative for congestion, hearing loss, nosebleeds, sore throat and tinnitus.   Eyes: Negative for blurred vision, double vision, pain and discharge.  Respiratory: Negative for cough, hemoptysis, sputum production, shortness of breath and wheezing.   Cardiovascular: Negative for chest pain, palpitations,  claudication, leg swelling and PND.  Gastrointestinal: Negative for heartburn, nausea, vomiting, abdominal pain, diarrhea, constipation, blood in stool and melena.  Genitourinary: Negative for dysuria, urgency, frequency and hematuria.  Musculoskeletal: Negative for myalgias, joint pain and falls.  Skin: Negative for itching and rash.  Neurological: Negative for dizziness, tingling, tremors, sensory change, speech change, focal weakness, seizures, loss of consciousness, weakness and headaches.  Endo/Heme/Allergies: Does not bruise/bleed easily.  Psychiatric/Behavioral: Negative for depression, suicidal ideas, memory loss and substance abuse. The patient is not nervous/anxious and does not have insomnia.   14 point review of systems was performed and is negative except as detailed under history of present illness and above   PHYSICAL EXAMINATION  ECOG PERFORMANCE STATUS: 0 - Asymptomatic  Filed Vitals:   04/18/16 1310  BP: 140/69  Pulse: 48  Temp: 97.5 F (36.4 C)  Resp: 16    Physical Exam  Constitutional: He is oriented to person, place, and time and well-developed, well-nourished, and in no distress.  HENT:  Head: Normocephalic and atraumatic.  Nose: Nose normal.  Mouth/Throat: Oropharynx is clear and moist. No oropharyngeal exudate.  Eyes: Conjunctivae and EOM are normal. Pupils are equal, round, and reactive to light. Right eye exhibits no discharge. Left eye exhibits no discharge. No scleral icterus.  Neck: Normal range of motion. Neck supple. No tracheal deviation present. No thyromegaly present.  Cardiovascular: Normal rate, regular rhythm and normal heart sounds.  Exam reveals no gallop and no friction rub.   No murmur heard. Pulmonary/Chest: Effort normal and breath sounds normal. He has no wheezes. He has no rales.  Abdominal: Soft. Bowel sounds are normal. He exhibits no distension and no mass. There is no tenderness. There is no rebound and no guarding.    Musculoskeletal: Normal range of motion. He exhibits no edema.  Lymphadenopathy:    He has no cervical adenopathy.  Neurological: He is alert and oriented to person, place, and time. He has normal reflexes. No cranial nerve deficit. Gait normal. Coordination normal.  Skin: Skin is warm and dry. No rash noted.  Psychiatric: Mood, memory, affect and judgment normal.  Nursing note and vitals reviewed.   LABORATORY DATA: I have reviewed the data as listed.  CBC    Component Value Date/Time   WBC 6.3 04/18/2016 1258   RBC 4.36 04/18/2016 1258   HGB 12.9* 04/18/2016 1258   HCT 38.7* 04/18/2016 1258   HCT 41 09/02/2012 1056   PLT 176 04/18/2016 1258   MCV 88.8 04/18/2016 1258   MCV 86.4 09/02/2012 1056   MCH 29.6 04/18/2016 1258   MCHC 33.3 04/18/2016 1258   RDW 12.8 04/18/2016 1258   LYMPHSABS 1.7 04/18/2016 1258   MONOABS 0.6 04/18/2016 1258   EOSABS 0.3 04/18/2016 1258   BASOSABS 0.0 04/18/2016 1258   CMP     Component Value Date/Time   NA 137 04/18/2016 1258   NA 140 09/02/2012 1058   K 3.9 04/18/2016 1258   K 4.2 09/02/2012 1058   CL 105 04/18/2016 1258   CL 105 09/02/2012 1058  CO2 26 04/18/2016 1258   GLUCOSE 101* 04/18/2016 1258   BUN 14 04/18/2016 1258   BUN 10 09/02/2012 1058   CREATININE 0.88 04/18/2016 1258   CREATININE 0.79 09/02/2012 1058   CALCIUM 9.0 04/18/2016 1258   CALCIUM 9.6 09/02/2012 1058   PROT 7.2 04/18/2016 1258   PROT 7.0 09/02/2012 1058   ALBUMIN 4.2 04/18/2016 1258   ALBUMIN 5.3 09/02/2012 1058   AST 32 04/18/2016 1258   AST 24 09/02/2012 1058   ALT 37 04/18/2016 1258   ALKPHOS 68 04/18/2016 1258   ALKPHOS 76 09/02/2012 1058   BILITOT 0.7 04/18/2016 1258   BILITOT 0.6 09/02/2012 1058   GFRNONAA >60 04/18/2016 1258   GFRAA >60 04/18/2016 1258   Results for CORNEILIUS, BIENSTOCK (MRN [id number])  Ref. Range 05/31/2015 09:58 10/01/2015 09:50  Serotonin, Serum  Latest Ref Range: 21-321 ng/mL 106 149   Results for VASHAUN, MINGA (MRN  [id number])   Ref. Range 05/31/2015 09:58 10/01/2015 09:50  Chromogranin A Latest Ref Range: 0-5 nmol/L 1 2     RADIOGRAPHIC STUDIES: I have reviewed the images listed below and agree with the results  CLINICAL DATA: Initial evaluation for mild cough.  EXAM: PORTABLE CHEST - 1 VIEW  COMPARISON: Prior radiograph from 03/10/2015  FINDINGS: There is accentuation of the cardiac silhouette, likely related AP technique and shallow lung inflation. Mediastinal silhouette within normal limits.  The lungs are hypoinflated with secondary bronchovascular crowding. No definite focal infiltrates identified. No pulmonary edema or pleural effusion. No pneumothorax.  No acute osseus abnormality.  IMPRESSION: Shallow lung inflation with secondary bibasilar bronchovascular crowding and/ or atelectasis. No definite active cardiopulmonary disease identified.   Electronically Signed  By: Jeannine Boga M.D.  On: 03/14/2015 23:44     PATHOLOGY: AGNOSIS Diagnosis 11/01/12 1. Colon, biopsy, lipoma at hepatic flexure and rectum - SUBMUCOSAL CARCINOID TUMOR. - SUBMUCOSAL MATURE ADIPOSE TISSUE. PLEASE SEE COMMENT. 2. Colon, polyp(s), sigmoid and rectal - HYPERPLASTIC POLYP(S). NO ADENOMATOUS CHANGE OR MALIGNANCY IDENTIFIED. 3. Duodenum, NOS biopsy - BENIGN SMALL BOWEL MUCOSA. NO VILLOUS ATROPHY, INFLAMMATION OR OTHER ABNORMALITIES PRESENT. 4. Stomach, biopsy - GASTRIC BODY AND ANTRAL TYPE MUCOSA WITH ASSOCIATED MINIMAL CHRONIC INFLAMMATION AND REACTIVE CHANGES. - NO EVIDENCE OF HELICOBACTER PYLORI, INTESTINAL METAPLASIA, DYSPLASIA OR MALIGNANCY. Microscopic Comment 1. There are clusters and nests of neoplastic cells with uniform nuclei and inconspicuous nucleoli. There is no evidence of necrosis or significant mitotic activity identified in this material. The overall morphologic features are diagnostic for carcinoid tumor. The tumor involves the biopsy edges. Clinical  correlation is recommended. Case was discussed with Dr. Oneida Alar on 11/04/12. 3. There is small bowel mucosa with normal villous architecture and no objective increase in inflammation. No villous atrophy, active inflammation or other significant changes identified. 4. A Warthin-Starry stain is performed to determine the possibility of the presence of Helicobacter pylori. The Warthin-Starry stain is negative for organisms of Helicobacter pylori. (HCL:eps 11/04/12) Aldona Bar MD Pathologist, Electronic Signature (Case signed 11/04/2012) Specimen Gross and Clinical Information FINAL DIAGNOSIS Diagnosis  12/11/2012 Rectum, polyp(s) - LOW GRADE, WELL DIFFERENTIATED NEUROENDOCRINE TUMOR (CARCINOID) (0.8 CM). - TUMOR FOCALLY EXTENDS TO CAUTERIZED TISSUE EDGE/POLYPECTOMY MARGIN. - SEE COMMENT. Microscopic Comment The morphology is diagnostic of carcinoid tumor. There are no atypical findings identified. Although the tumor is almost entirely excised, there are a few nests of tumor that are focally present at the cauterized tissue edge/polypectomy margin. (CR:kh 12-12-12) Mali RUND DO Pathologist, Electronic   ASSESSMENT and THERAPY PLAN:  CARCINOID  Colonoscopy 03/15/2015 with pancolonic diverticulosis, no evidence of recurrent carcinoid Anemia, history of diverticular bleed  65 year old male with carcinoid found on a biopsy during colonoscopy in 2013. He underwent a repeat colonoscopy in April 2016 with no carcinoid noted. He underwent an excision of the neuroendocrine tumor with Dr. Paulita Fujita in January 2014. Tumor extended to the cauterized tissue edge and polypectomy margin. He has no obvious evidence of progressive disease/recurrence. Laboratory studies today are pending: specifically chromogranin A and serotonin. I advised the patient will notify him of his laboratory study results when they become available.   We will schedule for follow up in 6 months with repeat labs.   He has a history  of diverticular bleed. Will monitor H/H. Have advised him to call if he notices BRBPR or black/tarry stool.   He is to continue to follow with GI as scheduled.   Orders Placed This Encounter  Procedures  . CBC With Differential    Standing Status: Future     Number of Occurrences:      Standing Expiration Date: 04/18/2017  . Comprehensive metabolic panel    Standing Status: Future     Number of Occurrences:      Standing Expiration Date: 04/18/2017  . Chromogranin A    Standing Status: Future     Number of Occurrences:      Standing Expiration Date: 04/18/2017  . Serotonin serum    Standing Status: Future     Number of Occurrences:      Standing Expiration Date: 04/18/2017    All questions were answered. The patient knows to call the clinic with any problems, questions or concerns. We can certainly see the patient much sooner if necessary. This note was electronically signed.  This document serves as a record of services personally performed by Ancil Linsey, MD. It was created on her behalf by Arlyce Harman, a trained medical scribe. The creation of this record is based on the scribe's personal observations and the provider's statements to them. This document has been checked and approved by the attending provider.  I have reviewed the above documentation for accuracy and completeness, and I agree with the above.  Molli Hazard, MD

## 2016-04-19 LAB — CHROMOGRANIN A

## 2016-04-20 LAB — SEROTONIN SERUM: Serotonin, Serum: 162 ng/mL (ref 21–321)

## 2016-05-23 ENCOUNTER — Other Ambulatory Visit (HOSPITAL_COMMUNITY): Payer: Self-pay | Admitting: *Deleted

## 2016-05-23 DIAGNOSIS — D3A026 Benign carcinoid tumor of the rectum: Secondary | ICD-10-CM

## 2016-05-23 DIAGNOSIS — K922 Gastrointestinal hemorrhage, unspecified: Secondary | ICD-10-CM

## 2016-06-17 ENCOUNTER — Encounter (HOSPITAL_COMMUNITY): Payer: Self-pay

## 2016-06-17 ENCOUNTER — Emergency Department (HOSPITAL_COMMUNITY)
Admission: EM | Admit: 2016-06-17 | Discharge: 2016-06-17 | Disposition: A | Discharge status: Home / Self Care | Payer: Medicare HMO | Attending: Emergency Medicine | Admitting: Emergency Medicine

## 2016-06-17 DIAGNOSIS — X58XXXA Exposure to other specified factors, initial encounter: Secondary | ICD-10-CM | POA: Insufficient documentation

## 2016-06-17 DIAGNOSIS — Y929 Unspecified place or not applicable: Secondary | ICD-10-CM | POA: Diagnosis not present

## 2016-06-17 DIAGNOSIS — S0501XA Injury of conjunctiva and corneal abrasion without foreign body, right eye, initial encounter: Secondary | ICD-10-CM | POA: Insufficient documentation

## 2016-06-17 DIAGNOSIS — Z87891 Personal history of nicotine dependence: Secondary | ICD-10-CM | POA: Insufficient documentation

## 2016-06-17 DIAGNOSIS — Z7982 Long term (current) use of aspirin: Secondary | ICD-10-CM | POA: Insufficient documentation

## 2016-06-17 DIAGNOSIS — Y999 Unspecified external cause status: Secondary | ICD-10-CM | POA: Insufficient documentation

## 2016-06-17 DIAGNOSIS — Z79899 Other long term (current) drug therapy: Secondary | ICD-10-CM | POA: Insufficient documentation

## 2016-06-17 DIAGNOSIS — H5711 Ocular pain, right eye: Secondary | ICD-10-CM | POA: Diagnosis present

## 2016-06-17 DIAGNOSIS — E785 Hyperlipidemia, unspecified: Secondary | ICD-10-CM | POA: Insufficient documentation

## 2016-06-17 DIAGNOSIS — Y939 Activity, unspecified: Secondary | ICD-10-CM | POA: Diagnosis not present

## 2016-06-17 MED ORDER — TETRACAINE HCL 0.5 % OP SOLN
2.0000 [drp] | Freq: Once | OPHTHALMIC | Status: AC
  Administered 2016-06-17: 2 [drp] via OPHTHALMIC
  Filled 2016-06-17: qty 4

## 2016-06-17 MED ORDER — HYDROCODONE-ACETAMINOPHEN 5-325 MG PO TABS: 1.0000 | ORAL_TABLET | Freq: Four times a day (QID) | ORAL | 0 refills | Status: DC | PRN

## 2016-06-17 MED ORDER — FLUORESCEIN SODIUM 1 MG OP STRP
1.0000 | ORAL_STRIP | Freq: Once | OPHTHALMIC | Status: AC
  Administered 2016-06-17: 1 via OPHTHALMIC
  Filled 2016-06-17: qty 1

## 2016-06-17 MED ORDER — ERYTHROMYCIN 5 MG/GM OP OINT: 1.0000 "application " | TOPICAL_OINTMENT | Freq: Four times a day (QID) | OPHTHALMIC | 0 refills | Status: DC

## 2016-06-17 NOTE — ED Triage Notes (Signed)
Was weed eating and something got in my eye.  Tried to wash it out, but it is still in there.

## 2016-06-17 NOTE — ED Provider Notes (Signed)
TIME SEEN: 5:25 AM  CHIEF COMPLAINT: Right eye pain  HPI: Pt is a 65 y.o. male with history of hyperlipidemia who presents to the emergency department with right eye pain. States that he was weed eating around 3 PM yesterday when he thinks he got something in his right eye. States that he "thought it would go away" is still having eye pain, eye irritation, tearing. No vision changes. He does not wear contacts or glasses at baseline. No headache or vomiting.  ROS: See HPI Constitutional: no fever  Eyes: no drainage  ENT: no runny nose   Cardiovascular:  no chest pain  Resp: no SOB  GI: no vomiting GU: no dysuria Integumentary: no rash  Allergy: no hives  Musculoskeletal: no leg swelling  Neurological: no slurred speech ROS otherwise negative  PAST MEDICAL HISTORY/PAST SURGICAL HISTORY:  Past Medical History:  Diagnosis Date  . 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 GERD (gastroesophageal reflux disease)   . Hyperlipidemia   . Lower GI bleeding APR 2016 TCS RMR   DUE TO CECAL DIVERTICULUM REQUIRED CLIPS/EPI    MEDICATIONS:  Prior to Admission medications   Medication Sig Start Date End Date Taking? Authorizing Provider  albuterol (PROVENTIL HFA;VENTOLIN HFA) 108 (90 BASE) MCG/ACT inhaler Inhale 2 puffs into the lungs every 4 (four) hours as needed for wheezing or shortness of breath. 03/10/15   Merryl Hacker, MD  aspirin EC 81 MG tablet Take 1 tablet (81 mg total) by mouth every other day. Resume in 1 week 03/16/15   Kathie Dike, MD  azelastine (ASTELIN) 137 MCG/SPRAY nasal spray Place 1 spray into the nose daily as needed. For allergies.    Historical Provider, MD  loratadine (CLARITIN) 10 MG tablet Take 10 mg by mouth daily as needed. For allergies.    Historical Provider, MD  Multiple Vitamin (MULTIVITAMIN WITH MINERALS) TABS Take 1 tablet by mouth every morning.    Historical Provider, MD  omeprazole (PRILOSEC) 20 MG capsule TAKE ONE CAPSULE BY MOUTH ONCE DAILY BEFORE  BREAKFAST 01/31/16   Carlis Stable, NP  pravastatin (PRAVACHOL) 20 MG tablet Take 20 mg by mouth at bedtime.     Historical Provider, MD    ALLERGIES:  No Known Allergies  SOCIAL HISTORY:  Social History  Substance Use Topics  . Smoking status: Former Smoker    Packs/day: 0.50    Years: 10.00    Types: Cigarettes    Quit date: 11/01/1981  . Smokeless tobacco: Never Used     Comment: quit 1999  . Alcohol use Yes     Comment: 1-2 drinks a month    FAMILY HISTORY: Family History  Problem Relation Age of Onset  . Colon cancer Other     maternal great uncle  . Lung cancer Other     uncles  . Cancer Mother   . Cancer Maternal Uncle     EXAM: BP 153/81 (BP Location: Right Arm)   Pulse (!) 51   Temp 97.7 F (36.5 C) (Oral)   Resp 16   Ht 5\' 6"  (1.676 m)   Wt 185 lb (83.9 kg)   SpO2 99%   BMI 29.86 kg/m  CONSTITUTIONAL: Alert and oriented and responds appropriately to questions. Well-appearing; well-nourished HEAD: Normocephalic EYES: Conjunctivae Injected on the right side, conjunctiva clear on the left, pupils equal reactive to light, extraocular movements intact, Normal visual fields, normal visual acuity (see nursing notes) - 20/25 in both eyes, patient has a small corneal abrasion  at the 7:00 position just next to the pupil over top of the virus, photophobia that resolves after tetracaine ENT: normal nose; no rhinorrhea; moist mucous membranes NECK: Supple, no meningismus, no LAD  CARD: RRR; S1 and S2 appreciated; no murmurs, no clicks, no rubs, no gallops RESP: Normal chest excursion without splinting or tachypnea; breath sounds clear and equal bilaterally; no wheezes, no rhonchi, no rales, no hypoxia or respiratory distress, speaking full sentences ABD/GI: Normal bowel sounds; non-distended; soft, non-tender, no rebound, no guarding, no peritoneal signs BACK:  The back appears normal and is non-tender to palpation, there is no CVA tenderness EXT: Normal ROM in all  joints; non-tender to palpation; no edema; normal capillary refill; no cyanosis, no calf tenderness or swelling    SKIN: Normal color for age and race; warm; no rash NEURO: Moves all extremities equally, sensation to light touch intact diffusely, cranial nerves II through XII intact PSYCH: The patient's mood and manner are appropriate. Grooming and personal hygiene are appropriate.  MEDICAL DECISION MAKING: Patient here with corneal abrasion. No foreign body appreciated. No globe injury, globe is intact. Visual acuity normal. No headache, vomiting. Doubt glaucoma. We'll treat with erythromycin ointment, pain location as needed. We'll give him an ophthalmology follow-up information.  At this time, I do not feel there is any life-threatening condition present. I have reviewed and discussed all results (EKG, imaging, lab, urine as appropriate), exam findings with patient. I have reviewed nursing notes and appropriate previous records.  I feel the patient is safe to be discharged home without further emergent workup. Discussed usual and customary return precautions. Patient and family (if present) verbalize understanding and are comfortable with this plan.  Patient will follow-up with their primary care provider. If they do not have a primary care provider, information for follow-up has been provided to them. All questions have been answered.      [place], DO 06/17/16 [phone]

## 2016-07-03 ENCOUNTER — Encounter (HOSPITAL_COMMUNITY): Payer: Medicare HMO | Attending: Hematology & Oncology

## 2016-07-03 DIAGNOSIS — K922 Gastrointestinal hemorrhage, unspecified: Secondary | ICD-10-CM

## 2016-07-03 DIAGNOSIS — D3A026 Benign carcinoid tumor of the rectum: Secondary | ICD-10-CM | POA: Diagnosis not present

## 2016-07-03 LAB — CBC WITH DIFFERENTIAL/PLATELET
BASOS ABS: 0 10*3/uL (ref 0.0–0.1)
Basophils Relative: 1 %
EOS PCT: 4 %
Eosinophils Absolute: 0.3 10*3/uL (ref 0.0–0.7)
HEMATOCRIT: 40.3 % (ref 39.0–52.0)
Hemoglobin: 13.4 g/dL (ref 13.0–17.0)
Lymphocytes Relative: 27 %
Lymphs Abs: 1.5 10*3/uL (ref 0.7–4.0)
MCH: 29.5 pg (ref 26.0–34.0)
MCHC: 33.3 g/dL (ref 30.0–36.0)
MCV: 88.6 fL (ref 78.0–100.0)
MONO ABS: 0.3 10*3/uL (ref 0.1–1.0)
MONOS PCT: 5 %
NEUTROS ABS: 3.6 10*3/uL (ref 1.7–7.7)
Neutrophils Relative %: 63 %
PLATELETS: 175 10*3/uL (ref 150–400)
RBC: 4.55 MIL/uL (ref 4.22–5.81)
RDW: 12.7 % (ref 11.5–15.5)
WBC: 5.7 10*3/uL (ref 4.0–10.5)

## 2016-10-19 ENCOUNTER — Encounter (HOSPITAL_COMMUNITY): Payer: Medicare HMO

## 2016-10-19 ENCOUNTER — Encounter (HOSPITAL_COMMUNITY): Payer: Medicare HMO | Attending: Hematology & Oncology | Admitting: Hematology & Oncology

## 2016-10-19 ENCOUNTER — Encounter (HOSPITAL_COMMUNITY): Payer: Self-pay | Admitting: Hematology & Oncology

## 2016-10-19 VITALS — BP 141/68 | HR 53 | Temp 97.8°F | Resp 20 | Wt 201.8 lb

## 2016-10-19 DIAGNOSIS — D649 Anemia, unspecified: Secondary | ICD-10-CM | POA: Diagnosis not present

## 2016-10-19 DIAGNOSIS — Z87891 Personal history of nicotine dependence: Secondary | ICD-10-CM | POA: Insufficient documentation

## 2016-10-19 DIAGNOSIS — D3A026 Benign carcinoid tumor of the rectum: Secondary | ICD-10-CM | POA: Insufficient documentation

## 2016-10-19 DIAGNOSIS — D62 Acute posthemorrhagic anemia: Secondary | ICD-10-CM

## 2016-10-19 DIAGNOSIS — Z23 Encounter for immunization: Secondary | ICD-10-CM

## 2016-10-19 DIAGNOSIS — K5731 Diverticulosis of large intestine without perforation or abscess with bleeding: Secondary | ICD-10-CM

## 2016-10-19 DIAGNOSIS — Z8719 Personal history of other diseases of the digestive system: Secondary | ICD-10-CM

## 2016-10-19 DIAGNOSIS — Z0000 Encounter for general adult medical examination without abnormal findings: Secondary | ICD-10-CM

## 2016-10-19 LAB — COMPREHENSIVE METABOLIC PANEL
ALBUMIN: 4.2 g/dL (ref 3.5–5.0)
ALK PHOS: 78 U/L (ref 38–126)
ALT: 42 U/L (ref 17–63)
ANION GAP: 7 (ref 5–15)
AST: 35 U/L (ref 15–41)
BUN: 15 mg/dL (ref 6–20)
CO2: 28 mmol/L (ref 22–32)
Calcium: 9.5 mg/dL (ref 8.9–10.3)
Chloride: 102 mmol/L (ref 101–111)
Creatinine, Ser: 0.92 mg/dL (ref 0.61–1.24)
GFR calc Af Amer: >60 mL/min (ref >60)
GFR calc non Af Amer: >60 mL/min (ref >60)
GLUCOSE: 103 mg/dL — AB (ref 65–99)
POTASSIUM: 3.9 mmol/L (ref 3.5–5.1)
SODIUM: 137 mmol/L (ref 135–145)
Total Bilirubin: 0.6 mg/dL (ref 0.3–1.2)
Total Protein: 7.3 g/dL (ref 6.5–8.1)

## 2016-10-19 LAB — CBC WITH DIFFERENTIAL/PLATELET
Basophils Absolute: 0 10*3/uL (ref 0.0–0.1)
Basophils Relative: 0 %
Eosinophils Absolute: 0.3 10*3/uL (ref 0.0–0.7)
Eosinophils Relative: 5 %
HEMATOCRIT: 40.9 % (ref 39.0–52.0)
Hemoglobin: 13.7 g/dL (ref 13.0–17.0)
LYMPHS PCT: 29 %
Lymphs Abs: 1.9 10*3/uL (ref 0.7–4.0)
MCH: 29.9 pg (ref 26.0–34.0)
MCHC: 33.5 g/dL (ref 30.0–36.0)
MCV: 89.3 fL (ref 78.0–100.0)
MONO ABS: 0.5 10*3/uL (ref 0.1–1.0)
MONOS PCT: 8 %
NEUTROS ABS: 3.6 10*3/uL (ref 1.7–7.7)
Neutrophils Relative %: 58 %
Platelets: 180 10*3/uL (ref 150–400)
RBC: 4.58 MIL/uL (ref 4.22–5.81)
RDW: 12.7 % (ref 11.5–15.5)
WBC: 6.3 10*3/uL (ref 4.0–10.5)

## 2016-10-19 MED ORDER — INFLUENZA VAC SPLIT QUAD 0.5 ML IM SUSY
0.5000 mL | PREFILLED_SYRINGE | Freq: Once | INTRAMUSCULAR | Status: AC
  Administered 2016-10-19: 0.5 mL via INTRAMUSCULAR
  Filled 2016-10-19: qty 0.5

## 2016-10-19 NOTE — Patient Instructions (Addendum)
[place] at [facility] Discharge Instructions  RECOMMENDATIONS MADE BY THE CONSULTANT AND ANY TEST RESULTS WILL BE SENT TO YOUR REFERRING PHYSICIAN.  You saw Dr.Penland today.  Return to see Dr. Whitney Muse in 6 months with labs.  See Amy at checkout for appointments.  Thank you for choosing [place] at [facility] to provide your oncology and hematology care.  To afford each patient quality time with our provider, please arrive at least 15 minutes before your scheduled appointment time.   Beginning January 23rd 2017 lab work for the [facility] will be done in the  Main lab at [facility] on 1st floor. If you have a lab appointment with the [facility] please come in thru the  Main Entrance and check in at the main information desk  You need to re-schedule your appointment should you arrive 10 or more minutes late.  We strive to give you quality time with our providers, and arriving late affects you and other patients whose appointments are after yours.  Also, if you no show three or more times for appointments you may be dismissed from the clinic at the providers discretion.     Again, thank you for choosing [facility].  Our hope is that these requests will decrease the amount of time that you wait before being seen by our physicians.       _____________________________________________________________  Should you have questions after your visit to [facility], please contact our office at [phone] between the hours of 8:30 a.m. and 4:30 p.m.  Voicemails left after 4:30 p.m. will not be returned until the following business day.  For prescription refill requests, have your pharmacy contact our office.         Resources For Cancer Patients and their Caregivers ? American Cancer Society: Can assist with transportation, wigs, general needs, runs Look Good Feel Better.        (661)496-8127 ? Cancer  Care: Provides financial assistance, online support groups, medication/co-pay assistance.  1-800-813-HOPE (620)622-6738) ? [place] Assists [place] cancer patients and their families through emotional , educational and financial support.  314-459-2613 ? [name] Where to apply for food stamps, Medicaid and utility assistance. 816-094-2035 ? [name]: Transportation to medical appointments. 331-489-9366 ? Social Security Administration: May apply for disability if have a Stage IV cancer. (854)760-8940 212 257 5196 ? [employer], Disability and Transit Services: Assists with nutrition, care and transit needs. [place] Support Programs: @10RELATIVEDAYS @ > Cancer Support Group  2nd Tuesday of the month 1pm-2pm, Journey Room  > Creative Journey  3rd Tuesday of the month 1130am-1pm, Journey Room  > Look Good Feel Better  1st Wednesday of the month 10am-12 noon, Journey Room (Call [facility] to register [phone])

## 2016-10-19 NOTE — Progress Notes (Signed)
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 Paulita Fujita however with tumor nests seen at the resection border.  Dr.Fields for followup.  CURRENT THERAPY: Observation  INTERVAL HISTORY: Tim Cunningham 65 y.o. male returns for follow-up of the diagnosis of carcinoid found on a screening colonoscopy. He denies any wheezing, SOB or palpitations. Appetite is good.   No change in bowel habits.   He notes that he feels well. Last colonoscopy 02/2015 with Dr. Dudley Major, pan diverticulosis noted. No evidence of recurrent carcinoid. Labs have been excellent. He has no complaints today.   MEDICAL HISTORY: Past Medical History:  Diagnosis Date  . 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 GERD (gastroesophageal reflux disease)   . Hyperlipidemia   . Lower GI bleeding APR 2016 TCS RMR   DUE TO CECAL DIVERTICULUM REQUIRED CLIPS/EPI    has Umbilical hernia without obstruction or gangrene; Heme positive stool; Carcinoid tumor of rectum; Colon cancer screening; Lower GI bleeding DUE TO DIVERTICULUM; GIB (gastrointestinal bleeding); Acute blood loss anemia; Diverticulosis of colon with hemorrhage; Hyperlipidemia; GERD (gastroesophageal reflux disease); and Healthcare maintenance on his problem list.     has No Known Allergies.  We administered Influenza vac split quadrivalent PF.  SURGICAL HISTORY: Past Surgical History:  Procedure Laterality Date  . COLONOSCOPY  11/01/2012   Dr. Oneida Alar: SML IH, RECTAL CARCINOID, HYPEPRPLASTIC POLYPS(2), Moderate TICS/Small lipoma IN HF  . COLONOSCOPY N/A 03/15/2015   Dr. Gala Romney: Pancolonic diverticulosis. Arterial bleeding arising from a cecal diverticulum status post hemostasis clip placement and injection therapy . Stie of prior rectal mucosal tattooing identified with no evidence of recurrent carcinoid tumor. No EGD done today,.  .  ESOPHAGOGASTRODUODENOSCOPY  11/01/2012   Dr. Fields:Schatzki ring was found/MILD GASTRITIS/DUODENIIS  . EUS  12/11/2012   RECTAL CARCINOID EXCISED-MARGINS NOT CLEAR  . FLEXIBLE SIGMOIDOSCOPY  Jan 2016   WITH EUS. Dr. Paulita Fujita. NO residual lesion identified  . TONSILLECTOMY      SOCIAL HISTORY: Social History   Social History  . Marital status: Divorced    Spouse name: N/A  . Number of children: 5  . Years of education: N/A   Occupational History  . unemployed    Social History Main Topics  . Smoking status: Former Smoker    Packs/day: 0.50    Years: 10.00    Types: Cigarettes    Quit date: 11/01/1981  . Smokeless tobacco: Never Used     Comment: quit 1999  . Alcohol use Yes     Comment: 1-2 drinks a month  . Drug use: No  . Sexual activity: Yes    Birth control/ protection: None   Other Topics Concern  . Not on file   Social History Narrative  . No narrative on file    FAMILY HISTORY: Family History  Problem Relation Age of Onset  . Cancer Mother   . Cancer Maternal Uncle   . Colon cancer Other     maternal great uncle  . Lung cancer Other     uncles    Review of Systems  Constitutional: Negative for fever, chills, weight loss and malaise/fatigue.  HENT: Negative for congestion, hearing loss, nosebleeds, sore throat and tinnitus.   Eyes: Negative for blurred vision, double vision, pain and discharge.  Respiratory: Negative for cough, hemoptysis, sputum production, shortness of breath and wheezing.   Cardiovascular: Negative for chest  pain, palpitations, claudication, leg swelling and PND.  Gastrointestinal: Negative for heartburn, nausea, vomiting, abdominal pain, diarrhea, constipation, blood in stool and melena.  Genitourinary: Negative for dysuria, urgency, frequency and hematuria.  Musculoskeletal: Negative for myalgias, joint pain and falls.  Skin: Negative for itching and rash.  Neurological: Negative for dizziness, tingling, tremors, sensory change,  speech change, focal weakness, seizures, loss of consciousness, weakness and headaches.  Endo/Heme/Allergies: Does not bruise/bleed easily.  Psychiatric/Behavioral: Negative for depression, suicidal ideas, memory loss and substance abuse. The patient is not nervous/anxious and does not have insomnia.   14 point review of systems was performed and is negative except as detailed under history of present illness and above   PHYSICAL EXAMINATION  ECOG PERFORMANCE STATUS: 0 - Asymptomatic  Vitals:   10/19/16 1337  BP: (!) 141/68  Pulse: (!) 53  Resp: 20  Temp: 97.8 F (36.6 C)    Physical Exam  Constitutional: He is oriented to person, place, and time and well-developed, well-nourished, and in no distress.  HENT:  Head: Normocephalic and atraumatic.  Nose: Nose normal.  Mouth/Throat: Oropharynx is clear and moist. No oropharyngeal exudate.  Eyes: Conjunctivae and EOM are normal. Pupils are equal, round, and reactive to light. Right eye exhibits no discharge. Left eye exhibits no discharge. No scleral icterus.  Neck: Normal range of motion. Neck supple. No tracheal deviation present. No thyromegaly present.  Cardiovascular: Normal rate, regular rhythm and normal heart sounds.  Exam reveals no gallop and no friction rub.   No murmur heard. Pulmonary/Chest: Effort normal and breath sounds normal. He has no wheezes. He has no rales.  Abdominal: Soft. Bowel sounds are normal. He exhibits no distension and no mass. There is no tenderness. There is no rebound and no guarding.  Musculoskeletal: Normal range of motion. He exhibits no edema.  Lymphadenopathy:    He has no cervical adenopathy.  Neurological: He is alert and oriented to person, place, and time. He has normal reflexes. No cranial nerve deficit. Gait normal. Coordination normal.  Skin: Skin is warm and dry. No rash noted.  Psychiatric: Mood, memory, affect and judgment normal.  Nursing note and vitals reviewed.   LABORATORY  DATA: I have reviewed the data as listed.  CBC    Component Value Date/Time   WBC 6.3 10/19/2016 1446   RBC 4.58 10/19/2016 1446   HGB 13.7 10/19/2016 1446   HCT 40.9 10/19/2016 1446   HCT 41 09/02/2012 1056   PLT 180 10/19/2016 1446   MCV 89.3 10/19/2016 1446   MCV 86.4 09/02/2012 1056   MCH 29.9 10/19/2016 1446   MCHC 33.5 10/19/2016 1446   RDW 12.7 10/19/2016 1446   LYMPHSABS 1.9 10/19/2016 1446   MONOABS 0.5 10/19/2016 1446   EOSABS 0.3 10/19/2016 1446   BASOSABS 0.0 10/19/2016 1446   CMP     Component Value Date/Time   NA 137 10/19/2016 1440   NA 140 09/02/2012 1058   K 3.9 10/19/2016 1440   K 4.2 09/02/2012 1058   CL 102 10/19/2016 1440   CL 105 09/02/2012 1058   CO2 28 10/19/2016 1440   GLUCOSE 103 (H) 10/19/2016 1440   BUN 15 10/19/2016 1440   BUN 10 09/02/2012 1058   CREATININE 0.92 10/19/2016 1440   CREATININE 0.79 09/02/2012 1058   CALCIUM 9.5 10/19/2016 1440   CALCIUM 9.6 09/02/2012 1058   PROT 7.3 10/19/2016 1440   PROT 7.0 09/02/2012 1058   ALBUMIN 4.2 10/19/2016 1440   ALBUMIN 5.3 09/02/2012 1058  AST 35 10/19/2016 1440   AST 24 09/02/2012 1058   ALT 42 10/19/2016 1440   ALKPHOS 78 10/19/2016 1440   ALKPHOS 76 09/02/2012 1058   BILITOT 0.6 10/19/2016 1440   BILITOT 0.6 09/02/2012 1058   GFRNONAA >60 10/19/2016 1440   GFRAA >60 10/19/2016 1440   Results for MASOUD, SCHMIDTKE (MRN [id number])  Ref. Range 05/31/2015 09:58 10/01/2015 09:50  Serotonin, Serum  Latest Ref Range: 21-321 ng/mL 106 149   Results for TAUREAN, BABICZ (MRN [id number])   Ref. Range 05/31/2015 09:58 10/01/2015 09:50  Chromogranin A Latest Ref Range: 0-5 nmol/L 1 2     RADIOGRAPHIC STUDIES: I have reviewed the images listed below and agree with the results  CLINICAL DATA: Initial evaluation for mild cough.  EXAM: PORTABLE CHEST - 1 VIEW  COMPARISON: Prior radiograph from 03/10/2015  FINDINGS: There is accentuation of the cardiac silhouette, likely related  AP technique and shallow lung inflation. Mediastinal silhouette within normal limits.  The lungs are hypoinflated with secondary bronchovascular crowding. No definite focal infiltrates identified. No pulmonary edema or pleural effusion. No pneumothorax.  No acute osseus abnormality.  IMPRESSION: Shallow lung inflation with secondary bibasilar bronchovascular crowding and/ or atelectasis. No definite active cardiopulmonary disease identified.   Electronically Signed  By: Jeannine Boga M.D.  On: 03/14/2015 23:44     PATHOLOGY: AGNOSIS Diagnosis 11/01/12 1. Colon, biopsy, lipoma at hepatic flexure and rectum - SUBMUCOSAL CARCINOID TUMOR. - SUBMUCOSAL MATURE ADIPOSE TISSUE. PLEASE SEE COMMENT. 2. Colon, polyp(s), sigmoid and rectal - HYPERPLASTIC POLYP(S). NO ADENOMATOUS CHANGE OR MALIGNANCY IDENTIFIED. 3. Duodenum, NOS biopsy - BENIGN SMALL BOWEL MUCOSA. NO VILLOUS ATROPHY, INFLAMMATION OR OTHER ABNORMALITIES PRESENT. 4. Stomach, biopsy - GASTRIC BODY AND ANTRAL TYPE MUCOSA WITH ASSOCIATED MINIMAL CHRONIC INFLAMMATION AND REACTIVE CHANGES. - NO EVIDENCE OF HELICOBACTER PYLORI, INTESTINAL METAPLASIA, DYSPLASIA OR MALIGNANCY. Microscopic Comment 1. There are clusters and nests of neoplastic cells with uniform nuclei and inconspicuous nucleoli. There is no evidence of necrosis or significant mitotic activity identified in this material. The overall morphologic features are diagnostic for carcinoid tumor. The tumor involves the biopsy edges. Clinical correlation is recommended. Case was discussed with Dr. Oneida Alar on 11/04/12. 3. There is small bowel mucosa with normal villous architecture and no objective increase in inflammation. No villous atrophy, active inflammation or other significant changes identified. 4. A Warthin-Starry stain is performed to determine the possibility of the presence of Helicobacter pylori. The Warthin-Starry stain is negative for  organisms of Helicobacter pylori. (HCL:eps 11/04/12) Aldona Bar MD Pathologist, Electronic Signature (Case signed 11/04/2012) Specimen Gross and Clinical Information FINAL DIAGNOSIS Diagnosis  12/11/2012 Rectum, polyp(s) - LOW GRADE, WELL DIFFERENTIATED NEUROENDOCRINE TUMOR (CARCINOID) (0.8 CM). - TUMOR FOCALLY EXTENDS TO CAUTERIZED TISSUE EDGE/POLYPECTOMY MARGIN. - SEE COMMENT. Microscopic Comment The morphology is diagnostic of carcinoid tumor. There are no atypical findings identified. Although the tumor is almost entirely excised, there are a few nests of tumor that are focally present at the cauterized tissue edge/polypectomy margin. (CR:kh 12-12-12) Mali RUND DO Pathologist, Electronic   ASSESSMENT and THERAPY PLAN:  CARCINOID Colonoscopy 03/15/2015 with pancolonic diverticulosis, no evidence of recurrent carcinoid Anemia, history of diverticular bleed  65 year old male with carcinoid found on a biopsy during colonoscopy in 2013. He underwent a repeat colonoscopy in April 2016 with no carcinoid noted. He underwent an excision of the neuroendocrine tumor with Dr. Paulita Fujita in January 2014. Tumor extended to the cauterized tissue edge and polypectomy margin. He has no obvious evidence of progressive disease/recurrence.  Laboratory studies today are pending: specifically chromogranin A and serotonin. I advised the patient will notify him of his laboratory study results when they become available.   We will schedule for follow up in 6 months with repeat labs.   He has a history of diverticular bleed. Will monitor H/H. Have advised him to call if he notices BRBPR or black/tarry stool.    He is to continue to follow with GI as scheduled.   Flu vaccination today.  Orders Placed This Encounter  Procedures  . CBC with Differential    Standing Status:   Standing    Number of Occurrences:   4    Standing Expiration Date:   10/19/2018  . Comprehensive metabolic panel    Standing  Status:   Standing    Number of Occurrences:   4    Standing Expiration Date:   10/19/2018  . Serotonin serum    Standing Status:   Standing    Number of Occurrences:   4    Standing Expiration Date:   10/19/2018  . Chromogranin A    Standing Status:   Standing    Number of Occurrences:   4    Standing Expiration Date:   10/19/2018  . CBC with Differential    Meds ordered this encounter  Medications  . Influenza vac split quadrivalent PF (FLUARIX) injection 0.5 mL    All questions were answered. The patient knows to call the clinic with any problems, questions or concerns. We can certainly see the patient much sooner if necessary. This note was electronically signed.  This document serves as a record of services personally performed by Ancil Linsey, MD. It was created on her behalf by Elmyra Ricks, a trained medical scribe. The creation of this record is based on the scribe's personal observations and the provider's statements to them. This document has been checked and approved by the attending provider.  I have reviewed the above documentation for accuracy and completeness, and I agree with the above.  Molli Hazard, MD

## 2016-10-19 NOTE — Progress Notes (Signed)
[name] presents today for injection per MD orders  Flu vaccine administered IM in left Upper Arm. Administration without incident. Patient tolerated well.

## 2016-10-20 LAB — CHROMOGRANIN A: Chromogranin A: <1 nmol/L (ref 0–5)

## 2016-10-22 ENCOUNTER — Encounter (HOSPITAL_COMMUNITY): Payer: Self-pay | Admitting: Hematology & Oncology

## 2016-10-23 LAB — SEROTONIN SERUM: Serotonin, Serum: 160 ng/mL (ref 21–321)

## 2017-02-26 ENCOUNTER — Other Ambulatory Visit: Payer: Self-pay | Admitting: Nurse Practitioner

## 2017-04-18 ENCOUNTER — Encounter (HOSPITAL_BASED_OUTPATIENT_CLINIC_OR_DEPARTMENT_OTHER): Payer: Medicare HMO | Admitting: Oncology

## 2017-04-18 ENCOUNTER — Encounter (HOSPITAL_COMMUNITY): Payer: Medicare HMO | Attending: Oncology

## 2017-04-18 ENCOUNTER — Other Ambulatory Visit (HOSPITAL_COMMUNITY): Payer: Medicare HMO

## 2017-04-18 ENCOUNTER — Encounter (HOSPITAL_COMMUNITY): Payer: Self-pay

## 2017-04-18 ENCOUNTER — Ambulatory Visit (HOSPITAL_COMMUNITY): Payer: Medicare HMO

## 2017-04-18 VITALS — BP 134/70 | HR 52 | Temp 97.6°F | Resp 18 | Wt 198.2 lb

## 2017-04-18 DIAGNOSIS — K219 Gastro-esophageal reflux disease without esophagitis: Secondary | ICD-10-CM | POA: Insufficient documentation

## 2017-04-18 DIAGNOSIS — D649 Anemia, unspecified: Secondary | ICD-10-CM | POA: Insufficient documentation

## 2017-04-18 DIAGNOSIS — K5730 Diverticulosis of large intestine without perforation or abscess without bleeding: Secondary | ICD-10-CM | POA: Diagnosis not present

## 2017-04-18 DIAGNOSIS — Z87891 Personal history of nicotine dependence: Secondary | ICD-10-CM | POA: Diagnosis not present

## 2017-04-18 DIAGNOSIS — D3A026 Benign carcinoid tumor of the rectum: Secondary | ICD-10-CM | POA: Insufficient documentation

## 2017-04-18 DIAGNOSIS — Z808 Family history of malignant neoplasm of other organs or systems: Secondary | ICD-10-CM | POA: Insufficient documentation

## 2017-04-18 DIAGNOSIS — E785 Hyperlipidemia, unspecified: Secondary | ICD-10-CM | POA: Diagnosis not present

## 2017-04-18 DIAGNOSIS — Z801 Family history of malignant neoplasm of trachea, bronchus and lung: Secondary | ICD-10-CM | POA: Insufficient documentation

## 2017-04-18 DIAGNOSIS — Z0000 Encounter for general adult medical examination without abnormal findings: Secondary | ICD-10-CM | POA: Insufficient documentation

## 2017-04-18 DIAGNOSIS — Z809 Family history of malignant neoplasm, unspecified: Secondary | ICD-10-CM | POA: Insufficient documentation

## 2017-04-18 LAB — COMPREHENSIVE METABOLIC PANEL
ALBUMIN: 4.1 g/dL (ref 3.5–5.0)
ALK PHOS: 84 U/L (ref 38–126)
ALT: 41 U/L (ref 17–63)
AST: 40 U/L (ref 15–41)
Anion gap: 6 (ref 5–15)
BILIRUBIN TOTAL: 0.7 mg/dL (ref 0.3–1.2)
BUN: 11 mg/dL (ref 6–20)
CALCIUM: 9.2 mg/dL (ref 8.9–10.3)
CO2: 27 mmol/L (ref 22–32)
CREATININE: 0.97 mg/dL (ref 0.61–1.24)
Chloride: 105 mmol/L (ref 101–111)
GFR calc Af Amer: >60 mL/min (ref >60)
GFR calc non Af Amer: >60 mL/min (ref >60)
GLUCOSE: 100 mg/dL — AB (ref 65–99)
Potassium: 3.8 mmol/L (ref 3.5–5.1)
Sodium: 138 mmol/L (ref 135–145)
TOTAL PROTEIN: 7.3 g/dL (ref 6.5–8.1)

## 2017-04-18 LAB — CBC WITH DIFFERENTIAL/PLATELET
Basophils Absolute: 0 10*3/uL (ref 0.0–0.1)
Basophils Relative: 0 %
Eosinophils Absolute: 0.1 10*3/uL (ref 0.0–0.7)
Eosinophils Relative: 2 %
HEMATOCRIT: 40.9 % (ref 39.0–52.0)
Hemoglobin: 13.6 g/dL (ref 13.0–17.0)
LYMPHS PCT: 20 %
Lymphs Abs: 1.4 10*3/uL (ref 0.7–4.0)
MCH: 29.4 pg (ref 26.0–34.0)
MCHC: 33.3 g/dL (ref 30.0–36.0)
MCV: 88.5 fL (ref 78.0–100.0)
MONO ABS: 0.5 10*3/uL (ref 0.1–1.0)
MONOS PCT: 8 %
NEUTROS ABS: 4.8 10*3/uL (ref 1.7–7.7)
Neutrophils Relative %: 70 %
PLATELETS: 179 10*3/uL (ref 150–400)
RBC: 4.62 MIL/uL (ref 4.22–5.81)
RDW: 12.8 % (ref 11.5–15.5)
WBC: 6.8 10*3/uL (ref 4.0–10.5)

## 2017-04-18 NOTE — Progress Notes (Signed)
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 Paulita Fujita however with tumor nests seen at the resection border.  Dr.Fields for followup.  CURRENT THERAPY: Observation  INTERVAL HISTORY: Tim Cunningham 66 y.o. male returns for follow-up of the diagnosis of carcinoid found on a screening colonoscopy.  Last colonoscopy 02/2015 with Dr. Dudley Major, pan diverticulosis noted. No evidence of recurrent carcinoid.   Patient has no complaints today. Denies any problems with his bowels or any GI bleeding.  Review of Systems  Constitutional: Negative for chills and fever.  HENT: Negative for hearing loss, sore throat and tinnitus.   Eyes: Negative for blurred vision, photophobia and discharge.  Respiratory: Negative for cough, hemoptysis, shortness of breath and wheezing.   Cardiovascular: Negative for chest pain, palpitations, orthopnea, claudication and leg swelling.  Gastrointestinal: Negative for abdominal pain, constipation, diarrhea, melena, nausea and vomiting.  Genitourinary: Negative for dysuria and hematuria.  Musculoskeletal: Negative for back pain, joint pain and myalgias.  Skin: Negative for itching and rash.  Neurological: Negative for dizziness, weakness and headaches.  Endo/Heme/Allergies: Negative for environmental allergies and polydipsia. Does not bruise/bleed easily.  Psychiatric/Behavioral: Negative for depression. The patient is not nervous/anxious and does not have insomnia.       MEDICAL HISTORY: Past Medical History:  Diagnosis Date  . 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 GERD (gastroesophageal reflux disease)   . Hyperlipidemia   . Lower GI bleeding APR 2016 TCS RMR   DUE TO CECAL DIVERTICULUM REQUIRED CLIPS/EPI    has Umbilical hernia without obstruction or gangrene; Heme positive stool; Carcinoid tumor of rectum; Colon cancer  screening; Lower GI bleeding DUE TO DIVERTICULUM; GIB (gastrointestinal bleeding); Acute blood loss anemia; Diverticulosis of colon with hemorrhage; Hyperlipidemia; GERD (gastroesophageal reflux disease); and Healthcare maintenance on his problem list.     has No Known Allergies.  Mr. Sena had no medications administered during this visit.  SURGICAL HISTORY: Past Surgical History:  Procedure Laterality Date  . COLONOSCOPY  11/01/2012   Dr. Oneida Alar: SML IH, RECTAL CARCINOID, HYPEPRPLASTIC POLYPS(2), Moderate TICS/Small lipoma IN HF  . COLONOSCOPY N/A 03/15/2015   Dr. Gala Romney: Pancolonic diverticulosis. Arterial bleeding arising from a cecal diverticulum status post hemostasis clip placement and injection therapy . Stie of prior rectal mucosal tattooing identified with no evidence of recurrent carcinoid tumor. No EGD done today,.  . ESOPHAGOGASTRODUODENOSCOPY  11/01/2012   Dr. Fields:Schatzki ring was found/MILD GASTRITIS/DUODENIIS  . EUS  12/11/2012   RECTAL CARCINOID EXCISED-MARGINS NOT CLEAR  . FLEXIBLE SIGMOIDOSCOPY  Jan 2016   WITH EUS. Dr. Paulita Fujita. NO residual lesion identified  . TONSILLECTOMY      SOCIAL HISTORY: Social History   Social History  . Marital status: Divorced    Spouse name: N/A  . Number of children: 5  . Years of education: N/A   Occupational History  . unemployed    Social History Main Topics  . Smoking status: Former Smoker    Packs/day: 0.50    Years: 10.00    Types: Cigarettes    Quit date: 11/01/1981  . Smokeless tobacco: Never Used     Comment: quit 1999  . Alcohol use Yes     Comment: 1-2 drinks a month  . Drug use: No  . Sexual activity: Yes    Birth control/ protection: None   Other Topics Concern  . Not on  file   Social History Narrative  . No narrative on file    FAMILY HISTORY: Family History  Problem Relation Age of Onset  . Cancer Mother   . Cancer Maternal Uncle   . Colon cancer Other        maternal great uncle  . Lung  cancer Other        uncles      PHYSICAL EXAMINATION  ECOG PERFORMANCE STATUS: 0 - Asymptomatic  Vitals:   04/18/17 1053  BP: 134/70  Pulse: (!) 52  Resp: 18  Temp: 97.6 F (36.4 C)    Physical Exam  Constitutional: He is oriented to person, place, and time and well-developed, well-nourished, and in no distress. No distress.  HENT:  Head: Normocephalic and atraumatic.  Mouth/Throat: No oropharyngeal exudate.  Eyes: Conjunctivae are normal. Pupils are equal, round, and reactive to light. No scleral icterus.  Neck: Normal range of motion. Neck supple. No JVD present.  Cardiovascular: Normal rate, regular rhythm and normal heart sounds.  Exam reveals no gallop and no friction rub.   No murmur heard. Pulmonary/Chest: Breath sounds normal. No respiratory distress. He has no wheezes. He has no rales.  Abdominal: Soft. Bowel sounds are normal. He exhibits no distension. There is no tenderness. There is no guarding.  Musculoskeletal: He exhibits no edema or tenderness.  Lymphadenopathy:    He has no cervical adenopathy.  Neurological: He is alert and oriented to person, place, and time. No cranial nerve deficit.  Skin: Skin is warm and dry. No rash noted. No erythema. No pallor.  Psychiatric: Affect and judgment normal.   LABORATORY DATA: I have reviewed the data as listed.  CBC    Component Value Date/Time   WBC 6.8 04/18/2017 1033   RBC 4.62 04/18/2017 1033   HGB 13.6 04/18/2017 1033   HCT 40.9 04/18/2017 1033   HCT 41 09/02/2012 1056   PLT 179 04/18/2017 1033   MCV 88.5 04/18/2017 1033   MCV 86.4 09/02/2012 1056   MCH 29.4 04/18/2017 1033   MCHC 33.3 04/18/2017 1033   RDW 12.8 04/18/2017 1033   LYMPHSABS 1.4 04/18/2017 1033   MONOABS 0.5 04/18/2017 1033   EOSABS 0.1 04/18/2017 1033   BASOSABS 0.0 04/18/2017 1033   CMP     Component Value Date/Time   NA 138 04/18/2017 1033   NA 140 09/02/2012 1058   K 3.8 04/18/2017 1033   K 4.2 09/02/2012 1058   CL 105  04/18/2017 1033   CL 105 09/02/2012 1058   CO2 27 04/18/2017 1033   GLUCOSE 100 (H) 04/18/2017 1033   BUN 11 04/18/2017 1033   BUN 10 09/02/2012 1058   CREATININE 0.97 04/18/2017 1033   CREATININE 0.79 09/02/2012 1058   CALCIUM 9.2 04/18/2017 1033   CALCIUM 9.6 09/02/2012 1058   PROT 7.3 04/18/2017 1033   PROT 7.0 09/02/2012 1058   ALBUMIN 4.1 04/18/2017 1033   ALBUMIN 5.3 09/02/2012 1058   AST 40 04/18/2017 1033   AST 24 09/02/2012 1058   ALT 41 04/18/2017 1033   ALKPHOS 84 04/18/2017 1033   ALKPHOS 76 09/02/2012 1058   BILITOT 0.7 04/18/2017 1033   BILITOT 0.6 09/02/2012 1058   GFRNONAA >60 04/18/2017 1033   GFRAA >60 04/18/2017 1033   Serotonin serum  Order: [id number]  Status:  Final result Visible to patient:  Yes (MyChart) Next appt:  04/18/2018 at 10:20 AM in Oncology ([facility]) Dx:  Carcinoid tumor of rectum    Ref Range &  Units 43mo ago (10/19/16) 43yr ago (04/18/16) 58yr ago (10/01/15)   Serotonin, Serum 21 - 321 ng/mL 160  162CM  149CM   Comment: (NOTE)        Chromogranin A  Order: [id number]  Status:  Final result Visible to patient:  Yes (MyChart) Next appt:  04/18/2018 at 10:20 AM in Oncology ([facility]) Dx:  Carcinoid tumor of rectum    Ref Range & Units 5mo ago (10/19/16) 38yr ago (04/18/16) 59yr ago (10/01/15)   Chromogranin A 0 - 5 nmol/L <1  <1CM  2CM   Comment: (NOTE)           RADIOGRAPHIC STUDIES: I have reviewed the images listed below and agree with the results  CLINICAL DATA: Initial evaluation for mild cough.  EXAM: PORTABLE CHEST - 1 VIEW  COMPARISON: Prior radiograph from 03/10/2015  FINDINGS: There is accentuation of the cardiac silhouette, likely related AP technique and shallow lung inflation. Mediastinal silhouette within normal limits.  The lungs are hypoinflated with secondary bronchovascular crowding. No definite focal infiltrates identified. No pulmonary edema or pleural effusion. No pneumothorax.  No  acute osseus abnormality.  IMPRESSION: Shallow lung inflation with secondary bibasilar bronchovascular crowding and/ or atelectasis. No definite active cardiopulmonary disease identified.   Electronically Signed  By: Jeannine Boga M.D.  On: 03/14/2015 23:44     PATHOLOGY: AGNOSIS Diagnosis 11/01/12 1. Colon, biopsy, lipoma at hepatic flexure and rectum - SUBMUCOSAL CARCINOID TUMOR. - SUBMUCOSAL MATURE ADIPOSE TISSUE. PLEASE SEE COMMENT. 2. Colon, polyp(s), sigmoid and rectal - HYPERPLASTIC POLYP(S). NO ADENOMATOUS CHANGE OR MALIGNANCY IDENTIFIED. 3. Duodenum, NOS biopsy - BENIGN SMALL BOWEL MUCOSA. NO VILLOUS ATROPHY, INFLAMMATION OR OTHER ABNORMALITIES PRESENT. 4. Stomach, biopsy - GASTRIC BODY AND ANTRAL TYPE MUCOSA WITH ASSOCIATED MINIMAL CHRONIC INFLAMMATION AND REACTIVE CHANGES. - NO EVIDENCE OF HELICOBACTER PYLORI, INTESTINAL METAPLASIA, DYSPLASIA OR MALIGNANCY. Microscopic Comment 1. There are clusters and nests of neoplastic cells with uniform nuclei and inconspicuous nucleoli. There is no evidence of necrosis or significant mitotic activity identified in this material. The overall morphologic features are diagnostic for carcinoid tumor. The tumor involves the biopsy edges. Clinical correlation is recommended. Case was discussed with Dr. Oneida Alar on 11/04/12. 3. There is small bowel mucosa with normal villous architecture and no objective increase in inflammation. No villous atrophy, active inflammation or other significant changes identified. 4. A Warthin-Starry stain is performed to determine the possibility of the presence of Helicobacter pylori. The Warthin-Starry stain is negative for organisms of Helicobacter pylori. (HCL:eps 11/04/12) Aldona Bar MD Pathologist, Electronic Signature (Case signed 11/04/2012) Specimen Gross and Clinical Information FINAL DIAGNOSIS Diagnosis  12/11/2012 Rectum, polyp(s) - LOW GRADE, WELL DIFFERENTIATED  NEUROENDOCRINE TUMOR (CARCINOID) (0.8 CM). - TUMOR FOCALLY EXTENDS TO CAUTERIZED TISSUE EDGE/POLYPECTOMY MARGIN. - SEE COMMENT. Microscopic Comment The morphology is diagnostic of carcinoid tumor. There are no atypical findings identified. Although the tumor is almost entirely excised, there are a few nests of tumor that are focally present at the cauterized tissue edge/polypectomy margin. (CR:kh 12-12-12) Mali RUND DO Pathologist, Electronic   ASSESSMENT and THERAPY PLAN:  CARCINOID Colonoscopy 03/15/2015 with pancolonic diverticulosis, no evidence of recurrent carcinoid Anemia, history of diverticular bleed  Patient is clinically NED. No evidence of anemia for the past year, hemoglobin has been stable in the 13 g/dL range. RTC in 1 year for follow up with labs.  Orders Placed This Encounter  Procedures  . CBC with Differential    Standing Status:   Future    Standing Expiration Date:  04/18/2018  . Comprehensive metabolic panel    Standing Status:   Future    Standing Expiration Date:   04/18/2018  . Chromogranin A  . Serotonin serum    No orders of the defined types were placed in this encounter.   All questions were answered. The patient knows to call the clinic with any problems, questions or concerns. We can certainly see the patient much sooner if necessary. This note was electronically signed.  This document serves as a record of services personally performed by Ancil Linsey, MD. It was created on her behalf by Elmyra Ricks, a trained medical scribe. The creation of this record is based on the scribe's personal observations and the provider's statements to them. This document has been checked and approved by the attending provider.  I have reviewed the above documentation for accuracy and completeness, and I agree with the above.  Twana First, MD

## 2017-04-18 NOTE — Patient Instructions (Addendum)
[place] at [facility] Discharge Instructions  RECOMMENDATIONS MADE BY THE CONSULTANT AND ANY TEST RESULTS WILL BE SENT TO YOUR REFERRING PHYSICIAN.  You were seen today by Dr. Twana First Follow up in 1 year with lab work   Thank you for choosing [place] at [facility] to provide your oncology and hematology care.  To afford each patient quality time with our provider, please arrive at least 15 minutes before your scheduled appointment time.    If you have a lab appointment with the [facility] please come in thru the  Main Entrance and check in at the main information desk  You need to re-schedule your appointment should you arrive 10 or more minutes late.  We strive to give you quality time with our providers, and arriving late affects you and other patients whose appointments are after yours.  Also, if you no show three or more times for appointments you may be dismissed from the clinic at the providers discretion.     Again, thank you for choosing [facility].  Our hope is that these requests will decrease the amount of time that you wait before being seen by our physicians.       _____________________________________________________________  Should you have questions after your visit to [facility], please contact our office at [phone] between the hours of 8:30 a.m. and 4:30 p.m.  Voicemails left after 4:30 p.m. will not be returned until the following business day.  For prescription refill requests, have your pharmacy contact our office.       Resources For Cancer Patients and their Caregivers ? American Cancer Society: Can assist with transportation, wigs, general needs, runs Look Good Feel Better.        ([phone] [phone] ? Cancer Care: Provides financial assistance, online support groups, medication/co-pay assistance.  1-800-813-HOPE 579-856-8413) ? [place] Assists  [place] cancer patients and their families through emotional , educational and financial support.  (769)270-9597 ? [name] Where to apply for food stamps, Medicaid and utility assistance. (340) 111-4699 ? [name]: Transportation to medical appointments. (770) 309-9980 ? Social Security Administration: May apply for disability if have a Stage IV cancer. 445-207-4754 520 004 6950 ? [employer], Disability and Transit Services: Assists with nutrition, care and transit needs. [place] Support Programs: @10RELATIVEDAYS @ > Cancer Support Group  2nd Tuesday of the month 1pm-2pm, Journey Room  > Creative Journey  3rd Tuesday of the month 1130am-1pm, Journey Room  > Look Good Feel Better  1st Wednesday of the month 10am-12 noon, Journey Room (Call [place] to register [phone])

## 2017-04-20 LAB — CHROMOGRANIN A

## 2017-04-20 LAB — SEROTONIN SERUM: Serotonin, Serum: 185 ng/mL (ref 21–321)

## 2018-01-29 ENCOUNTER — Other Ambulatory Visit: Payer: Self-pay

## 2018-01-29 ENCOUNTER — Emergency Department (HOSPITAL_COMMUNITY): Payer: Medicare HMO

## 2018-01-29 ENCOUNTER — Encounter (HOSPITAL_COMMUNITY): Payer: Self-pay | Admitting: Emergency Medicine

## 2018-01-29 DIAGNOSIS — Z79899 Other long term (current) drug therapy: Secondary | ICD-10-CM | POA: Insufficient documentation

## 2018-01-29 DIAGNOSIS — J069 Acute upper respiratory infection, unspecified: Secondary | ICD-10-CM | POA: Diagnosis not present

## 2018-01-29 DIAGNOSIS — Z7982 Long term (current) use of aspirin: Secondary | ICD-10-CM | POA: Diagnosis not present

## 2018-01-29 DIAGNOSIS — Z87891 Personal history of nicotine dependence: Secondary | ICD-10-CM | POA: Insufficient documentation

## 2018-01-29 DIAGNOSIS — B9789 Other viral agents as the cause of diseases classified elsewhere: Secondary | ICD-10-CM | POA: Insufficient documentation

## 2018-01-29 DIAGNOSIS — R05 Cough: Secondary | ICD-10-CM | POA: Diagnosis present

## 2018-01-29 NOTE — ED Triage Notes (Signed)
Pt c/o head/chest congestion with productive cough since Saturday.

## 2018-01-30 ENCOUNTER — Other Ambulatory Visit: Payer: Self-pay

## 2018-01-30 ENCOUNTER — Emergency Department (HOSPITAL_COMMUNITY)
Admission: EM | Admit: 2018-01-30 | Discharge: 2018-01-30 | Disposition: A | Discharge status: Home / Self Care | Payer: Medicare HMO | Attending: Emergency Medicine | Admitting: Emergency Medicine

## 2018-01-30 DIAGNOSIS — B9789 Other viral agents as the cause of diseases classified elsewhere: Secondary | ICD-10-CM

## 2018-01-30 DIAGNOSIS — J069 Acute upper respiratory infection, unspecified: Secondary | ICD-10-CM

## 2018-01-30 MED ORDER — PREDNISONE 20 MG PO TABS: 40.0000 mg | ORAL_TABLET | Freq: Every day | ORAL | 0 refills | Status: DC

## 2018-01-30 MED ORDER — ALBUTEROL SULFATE HFA 108 (90 BASE) MCG/ACT IN AERS
2.0000 | INHALATION_SPRAY | RESPIRATORY_TRACT | Status: DC | PRN
  Administered 2018-01-30: 2 via RESPIRATORY_TRACT
  Filled 2018-01-30: qty 6.7

## 2018-01-30 NOTE — ED Provider Notes (Signed)
[facility] EMERGENCY DEPARTMENT Provider Note   CSN: [id number] Arrival date & time: 01/29/18  2236     History   Chief Complaint Chief Complaint  Patient presents with  . Cough    HPI Tim Cunningham is a 67 y.o. male.  Patient presents to the emergency department for evaluation of cough and congestion.  He reports that it started approximately a week ago, but in the last 3 days or so it has significantly worsened.  He has been wheezing, does not have an inhaler.  He reports that he does not have COPD or asthma, but has had bronchitis with wheezing in the past.      Past Medical History:  Diagnosis Date  . 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 GERD (gastroesophageal reflux disease)   . Hyperlipidemia   . Lower GI bleeding APR 2016 TCS RMR   DUE TO CECAL DIVERTICULUM REQUIRED CLIPS/EPI    Patient Active Problem List   Diagnosis Date Noted  . Healthcare maintenance 10/19/2016  . GERD (gastroesophageal reflux disease) 03/30/2016  . Diverticulosis of colon with hemorrhage   . Hyperlipidemia   . Acute blood loss anemia   . Lower GI bleeding DUE TO DIVERTICULUM 03/14/2015  . GIB (gastrointestinal bleeding) 03/14/2015  . Colon cancer screening 01/23/2013  . Carcinoid tumor of rectum 12/25/2012  . Umbilical hernia without obstruction or gangrene 10/15/2012  . Heme positive stool 10/15/2012    Past Surgical History:  Procedure Laterality Date  . COLONOSCOPY  11/01/2012   Dr. Oneida Alar: SML IH, RECTAL CARCINOID, HYPEPRPLASTIC POLYPS(2), Moderate TICS/Small lipoma IN HF  . COLONOSCOPY N/A 03/15/2015   Dr. Gala Romney: Pancolonic diverticulosis. Arterial bleeding arising from a cecal diverticulum status post hemostasis clip placement and injection therapy . Stie of prior rectal mucosal tattooing identified with no evidence of recurrent carcinoid tumor. No EGD done today,.  . ESOPHAGOGASTRODUODENOSCOPY  11/01/2012   Dr. Fields:Schatzki ring was found/MILD GASTRITIS/DUODENIIS  . EUS   12/11/2012   RECTAL CARCINOID EXCISED-MARGINS NOT CLEAR  . FLEXIBLE SIGMOIDOSCOPY  Jan 2016   WITH EUS. Dr. Paulita Fujita. NO residual lesion identified  . TONSILLECTOMY         Home Medications    Prior to Admission medications   Medication Sig Start Date End Date Taking? Authorizing Provider  aspirin EC 81 MG tablet Take 1 tablet (81 mg total) by mouth every other day. Resume in 1 week 03/16/15   Kathie Dike, MD  loratadine (CLARITIN) 10 MG tablet Take 10 mg by mouth daily as needed. For allergies.    [provider]  lovastatin (MEVACOR) 20 MG tablet Take 20 mg by mouth daily. 09/21/16   [provider]  Multiple Vitamin (MULTIVITAMIN WITH MINERALS) TABS Take 1 tablet by mouth every morning.    [provider]  omeprazole (PRILOSEC) 20 MG capsule TAKE ONE CAPSULE BY MOUTH ONCE DAILY BEFORE BREAKFAST 02/26/17   Mahala Menghini, [name]  predniSONE (DELTASONE) 20 MG tablet Take 2 tablets (40 mg total) by mouth daily with breakfast. 01/30/18   Pollina, Gwenyth Allegra, MD    Family History Family History  Problem Relation Age of Onset  . Cancer Mother   . Cancer Maternal Uncle   . Colon cancer Other        maternal great uncle  . Lung cancer Other        uncles    Social History Social History   Tobacco Use  . Smoking status: Former Smoker    Packs/day:  0.50    Years: 10.00    Pack years: 5.00    Types: Cigarettes    Last attempt to quit: 11/01/1981    Years since quitting: 36.2  . Smokeless tobacco: Never Used  . Tobacco comment: quit 1999  Substance Use Topics  . Alcohol use: Yes    Comment: 1-2 drinks a month  . Drug use: No     Allergies   Patient has no known allergies.   Review of Systems Review of Systems  HENT: Positive for congestion.   Respiratory: Positive for cough and wheezing.   All other systems reviewed and are negative.    Physical Exam Updated Vital Signs BP (!) 162/92 (BP Location: Right Arm)   Pulse 85   Temp 98.5  F (36.9 C) (Oral)   Resp 15   Ht 5\' 6"  (1.676 m)   Wt 83.9 kg (185 lb)   SpO2 98%   BMI 29.86 kg/m   Physical Exam  Constitutional: He is oriented to person, place, and time. He appears well-developed and well-nourished. No distress.  HENT:  Head: Normocephalic and atraumatic.  Right Ear: Hearing normal.  Left Ear: Hearing normal.  Nose: Nose normal.  Mouth/Throat: Oropharynx is clear and moist and mucous membranes are normal.  Eyes: Conjunctivae and EOM are normal. Pupils are equal, round, and reactive to light.  Neck: Normal range of motion. Neck supple.  Cardiovascular: Regular rhythm, S1 normal and S2 normal. Exam reveals no gallop and no friction rub.  No murmur heard. Pulmonary/Chest: Effort normal. No respiratory distress. He has wheezes. He exhibits no tenderness.  Abdominal: Soft. Normal appearance and bowel sounds are normal. There is no hepatosplenomegaly. There is no tenderness. There is no rebound, no guarding, no tenderness at McBurney's point and negative Murphy's sign. No hernia.  Musculoskeletal: Normal range of motion.  Neurological: He is alert and oriented to person, place, and time. He has normal strength. No cranial nerve deficit or sensory deficit. Coordination normal. GCS eye subscore is 4. GCS verbal subscore is 5. GCS motor subscore is 6.  Skin: Skin is warm, dry and intact. No rash noted. No cyanosis.  Psychiatric: He has a normal mood and affect. His speech is normal and behavior is normal. Thought content normal.  Nursing note and vitals reviewed.    ED Treatments / Results  Labs (all labs ordered are listed, but only abnormal results are displayed) Labs Reviewed - No data to display  EKG  EKG Interpretation None       Radiology Dg Chest 2 View  Result Date: 01/29/2018 CLINICAL DATA:  Cough and congestion EXAM: CHEST - 2 VIEW COMPARISON:  Chest radiograph 03/14/2015 FINDINGS: The heart size and mediastinal contours are within normal limits.  Both lungs are clear. The visualized skeletal structures are unremarkable. IMPRESSION: No active cardiopulmonary disease. Electronically Signed   By: Ulyses Jarred M.D.   On: 01/29/2018 23:03    Procedures Procedures (including critical care time)  Medications Ordered in ED Medications - No data to display   Initial Impression / Assessment and Plan / ED Course  I have reviewed the triage vital signs and the nursing notes.  Pertinent labs & imaging results that were available during my care of the patient were reviewed by me and considered in my medical decision making (see chart for details).     Patient presents to the emergency department for evaluation of cough and chest congestion.  He has been sick for approximately a week.  He  does have wheezing today but his oxygen saturations are normal and he is not having any increased respiratory effort.  Will treat with albuterol, prednisone.  His x-ray is clear, does not require antibiotics.  Final Clinical Impressions(s) / ED Diagnoses   Final diagnoses:  Viral URI with cough    ED Discharge Orders        Ordered    predniSONE (DELTASONE) 20 MG tablet  Daily with breakfast     01/30/18 0042       Orpah Greek, MD 01/30/18 [phone]

## 2018-04-15 ENCOUNTER — Other Ambulatory Visit (HOSPITAL_COMMUNITY): Payer: Self-pay | Admitting: *Deleted

## 2018-04-15 DIAGNOSIS — D3A026 Benign carcinoid tumor of the rectum: Secondary | ICD-10-CM

## 2018-04-18 ENCOUNTER — Inpatient Hospital Stay (HOSPITAL_COMMUNITY): Payer: Medicare HMO | Attending: Hematology

## 2018-04-18 ENCOUNTER — Inpatient Hospital Stay (HOSPITAL_BASED_OUTPATIENT_CLINIC_OR_DEPARTMENT_OTHER): Payer: Medicare HMO | Admitting: Internal Medicine

## 2018-04-18 ENCOUNTER — Encounter (HOSPITAL_COMMUNITY): Payer: Self-pay | Admitting: Internal Medicine

## 2018-04-18 VITALS — BP 126/68 | HR 51 | Temp 97.5°F | Resp 18 | Wt 196.2 lb

## 2018-04-18 DIAGNOSIS — Z87891 Personal history of nicotine dependence: Secondary | ICD-10-CM

## 2018-04-18 DIAGNOSIS — Z800 Family history of malignant neoplasm of digestive organs: Secondary | ICD-10-CM | POA: Insufficient documentation

## 2018-04-18 DIAGNOSIS — Z85040 Personal history of malignant carcinoid tumor of rectum: Secondary | ICD-10-CM | POA: Insufficient documentation

## 2018-04-18 DIAGNOSIS — Z862 Personal history of diseases of the blood and blood-forming organs and certain disorders involving the immune mechanism: Secondary | ICD-10-CM

## 2018-04-18 DIAGNOSIS — K219 Gastro-esophageal reflux disease without esophagitis: Secondary | ICD-10-CM | POA: Diagnosis not present

## 2018-04-18 DIAGNOSIS — Z809 Family history of malignant neoplasm, unspecified: Secondary | ICD-10-CM

## 2018-04-18 DIAGNOSIS — E785 Hyperlipidemia, unspecified: Secondary | ICD-10-CM | POA: Diagnosis not present

## 2018-04-18 DIAGNOSIS — Z79899 Other long term (current) drug therapy: Secondary | ICD-10-CM | POA: Insufficient documentation

## 2018-04-18 DIAGNOSIS — Z86010 Personal history of colonic polyps: Secondary | ICD-10-CM

## 2018-04-18 DIAGNOSIS — Z8719 Personal history of other diseases of the digestive system: Secondary | ICD-10-CM | POA: Diagnosis not present

## 2018-04-18 DIAGNOSIS — D3A026 Benign carcinoid tumor of the rectum: Secondary | ICD-10-CM

## 2018-04-18 DIAGNOSIS — Z801 Family history of malignant neoplasm of trachea, bronchus and lung: Secondary | ICD-10-CM | POA: Diagnosis not present

## 2018-04-18 DIAGNOSIS — Z85048 Personal history of other malignant neoplasm of rectum, rectosigmoid junction, and anus: Secondary | ICD-10-CM | POA: Diagnosis not present

## 2018-04-18 LAB — CBC WITH DIFFERENTIAL/PLATELET
BASOS ABS: 0 10*3/uL (ref 0.0–0.1)
BASOS PCT: 0 %
Eosinophils Absolute: 0.2 10*3/uL (ref 0.0–0.7)
Eosinophils Relative: 4 %
HEMATOCRIT: 39.6 % (ref 39.0–52.0)
HEMOGLOBIN: 12.9 g/dL — AB (ref 13.0–17.0)
LYMPHS PCT: 29 %
Lymphs Abs: 1.5 10*3/uL (ref 0.7–4.0)
MCH: 29 pg (ref 26.0–34.0)
MCHC: 32.6 g/dL (ref 30.0–36.0)
MCV: 89 fL (ref 78.0–100.0)
MONOS PCT: 5 %
Monocytes Absolute: 0.3 10*3/uL (ref 0.1–1.0)
NEUTROS ABS: 3.1 10*3/uL (ref 1.7–7.7)
NEUTROS PCT: 62 %
Platelets: 169 10*3/uL (ref 150–400)
RBC: 4.45 MIL/uL (ref 4.22–5.81)
RDW: 12.6 % (ref 11.5–15.5)
WBC: 5.2 10*3/uL (ref 4.0–10.5)

## 2018-04-18 LAB — COMPREHENSIVE METABOLIC PANEL
ALBUMIN: 4 g/dL (ref 3.5–5.0)
ALK PHOS: 75 U/L (ref 38–126)
ALT: 30 U/L (ref 17–63)
AST: 35 U/L (ref 15–41)
Anion gap: 8 (ref 5–15)
BILIRUBIN TOTAL: 1 mg/dL (ref 0.3–1.2)
BUN: 13 mg/dL (ref 6–20)
CALCIUM: 9.2 mg/dL (ref 8.9–10.3)
CO2: 27 mmol/L (ref 22–32)
CREATININE: 0.96 mg/dL (ref 0.61–1.24)
Chloride: 104 mmol/L (ref 101–111)
GFR calc Af Amer: >60 mL/min (ref >60)
GLUCOSE: 139 mg/dL — AB (ref 65–99)
Potassium: 3.8 mmol/L (ref 3.5–5.1)
Sodium: 139 mmol/L (ref 135–145)
TOTAL PROTEIN: 7.4 g/dL (ref 6.5–8.1)

## 2018-04-18 NOTE — Patient Instructions (Signed)
[place] at [name] Discharge Instructions You were seen by Dr. Walden Field today. Follow-up as scheduled. Call clinic for any questions or concerns   Thank you for choosing [name] at [name] to provide your oncology and hematology care.  To afford each patient quality time with our provider, please arrive at least 15 minutes before your scheduled appointment time.   If you have a lab appointment with the [facility] please come in thru the  Main Entrance and check in at the main information desk  You need to re-schedule your appointment should you arrive 10 or more minutes late.  We strive to give you quality time with our providers, and arriving late affects you and other patients whose appointments are after yours.  Also, if you no show three or more times for appointments you may be dismissed from the clinic at the providers discretion.     Again, thank you for choosing [name].  Our hope is that these requests will decrease the amount of time that you wait before being seen by our physicians.       _____________________________________________________________  Should you have questions after your visit to [name], please contact our office at [phone] between the hours of 8:30 a.m. and 4:30 p.m.  Voicemails left after 4:30 p.m. will not be returned until the following business day.  For prescription refill requests, have your pharmacy contact our office.       Resources For Cancer Patients and their Caregivers ? American Cancer Society: Can assist with transportation, wigs, general needs, runs Look Good Feel Better.        507-103-2052 ? Cancer Care: Provides financial assistance, online support groups, medication/co-pay assistance.  1-800-813-HOPE 5627418232) ? [name] Assists [name] cancer patients and their families through emotional , educational and  financial support.  (763) 478-3670 ? [name] Where to apply for food stamps, Medicaid and utility assistance. 416-361-0120 ? [name]: Transportation to medical appointments. 571-076-3006 ? Social Security Administration: May apply for disability if have a Stage IV cancer. 573 145 2901 325-015-6643 ? [employer], Disability and Transit Services: Assists with nutrition, care and transit needs. [name]:   > Cancer Support Group  2nd Tuesday of the month 1pm-2pm, Journey Room   > Creative Journey  3rd Tuesday of the month 1130am-1pm, Journey Room

## 2018-04-18 NOTE — Progress Notes (Signed)
Diagnosis Carcinoid tumor of rectum - Plan: CBC with Differential/Platelet, Comprehensive metabolic panel, Lactate dehydrogenase, Chromogranin A  Staging Cancer Staging No matching staging information was found for the patient.  Assessment and Plan:1. 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 Paulita Fujita however with tumor nests seen at the resection border.  Pt had Colonoscopy 03/15/2015 with pancolonic diverticulosis, no evidence of recurrent carcinoid.    Labs done 04/18/2018 reviewed with the pt show WBC 5.2, hb 12.9 plts 169,000.  Recent CHR A level has been less than 1.  He last had scans done 08/2013 and were negative.  He will continue yearly follow-up with labs.  He should notify the office if any problems prior to his next visit.    2.  Anemia, history of diverticular bleed.  Follow-up with GI as recommended.  HB 12.9 on labs done 04/18/2018.    3.  Health maintenance.  Continue GI follow-up as recommended.    Interval History:  67 yr old male with carcinoid found on a screening colonoscopy.  Last colonoscopy 02/2015 with Dr. Dudley Major, pan diverticulosis noted. No evidence of recurrent carcinoid.  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 Paulita Fujita however with tumor nests seen at the resection border.  Dr.Fields for followup.  Current Status:  Pt is seen today for follow-up.  He is here to go over labs.     PATHOLOGY: AGNOSIS Diagnosis 11/01/12 1. Colon, biopsy, lipoma at hepatic flexure and rectum - SUBMUCOSAL CARCINOID TUMOR. - SUBMUCOSAL MATURE ADIPOSE TISSUE. PLEASE SEE COMMENT. 2. Colon, polyp(s), sigmoid and rectal - HYPERPLASTIC POLYP(S). NO ADENOMATOUS CHANGE OR MALIGNANCY IDENTIFIED. 3. Duodenum, NOS biopsy - BENIGN SMALL BOWEL MUCOSA. NO VILLOUS ATROPHY, INFLAMMATION OR OTHER ABNORMALITIES PRESENT. 4. Stomach, biopsy - GASTRIC BODY AND ANTRAL TYPE  MUCOSA WITH ASSOCIATED MINIMAL CHRONIC INFLAMMATION AND REACTIVE CHANGES. - NO EVIDENCE OF HELICOBACTER PYLORI, INTESTINAL METAPLASIA, DYSPLASIA OR MALIGNANCY. Microscopic Comment 1. There are clusters and nests of neoplastic cells with uniform nuclei and inconspicuous nucleoli. There is no evidence of necrosis or significant mitotic activity identified in this material. The overall morphologic features are diagnostic for carcinoid tumor. The tumor involves the biopsy edges. Clinical correlation is recommended. Case was discussed with Dr. Oneida Alar on 11/04/12. 3. There is small bowel mucosa with normal villous architecture and no objective increase in inflammation. No villous atrophy, active inflammation or other significant changes identified. 4. A Warthin-Starry stain is performed to determine the possibility of the presence of Helicobacter pylori. The Warthin-Starry stain is negative for organisms of Helicobacter pylori. (HCL:eps 11/04/12) Aldona Bar MD Pathologist, Electronic Signature (Case signed 11/04/2012) Specimen Gross and Clinical Information FINAL DIAGNOSIS Diagnosis  12/11/2012 Rectum, polyp(s) - LOW GRADE, WELL DIFFERENTIATED NEUROENDOCRINE TUMOR (CARCINOID) (0.8 CM). - TUMOR FOCALLY EXTENDS TO CAUTERIZED TISSUE EDGE/POLYPECTOMY MARGIN. - SEE COMMENT. Microscopic Comment The morphology is diagnostic of carcinoid tumor. There are no atypical findings identified. Although the tumor is almost entirely excised, there are a few nests of tumor that are focally present at the cauterized tissue edge/polypectomy margin. (CR:kh 12-12-12) Mali RUND DO Pathologist, Electronic    Problem List Patient Active Problem List   Diagnosis Date Noted  . Healthcare maintenance [Z00.00] 10/19/2016  . GERD (gastroesophageal reflux disease) [K21.9] 03/30/2016  . Diverticulosis of colon with hemorrhage [K57.31]   . Hyperlipidemia [E78.5]   . Acute blood loss anemia [D62]   . Lower GI  bleeding DUE TO DIVERTICULUM [K92.2]  03/14/2015  . GIB (gastrointestinal bleeding) [K92.2] 03/14/2015  . Colon cancer screening [Z12.11] 01/23/2013  . Carcinoid tumor of rectum [D3A.026] 12/25/2012  . Umbilical hernia without obstruction or gangrene [K42.9] 10/15/2012  . Heme positive stool [R19.5] 10/15/2012    Past Medical History Past Medical History:  Diagnosis Date  . 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 GERD (gastroesophageal reflux disease)   . Hyperlipidemia   . Lower GI bleeding APR 2016 TCS RMR   DUE TO CECAL DIVERTICULUM REQUIRED CLIPS/EPI    Past Surgical History Past Surgical History:  Procedure Laterality Date  . COLONOSCOPY  11/01/2012   Dr. Oneida Alar: SML IH, RECTAL CARCINOID, HYPEPRPLASTIC POLYPS(2), Moderate TICS/Small lipoma IN HF  . COLONOSCOPY N/A 03/15/2015   Dr. Gala Romney: Pancolonic diverticulosis. Arterial bleeding arising from a cecal diverticulum status post hemostasis clip placement and injection therapy . Stie of prior rectal mucosal tattooing identified with no evidence of recurrent carcinoid tumor. No EGD done today,.  . ESOPHAGOGASTRODUODENOSCOPY  11/01/2012   Dr. Fields:Schatzki ring was found/MILD GASTRITIS/DUODENIIS  . EUS  12/11/2012   RECTAL CARCINOID EXCISED-MARGINS NOT CLEAR  . FLEXIBLE SIGMOIDOSCOPY  Jan 2016   WITH EUS. Dr. Paulita Fujita. NO residual lesion identified  . TONSILLECTOMY      Family History Family History  Problem Relation Age of Onset  . Cancer Mother   . Cancer Maternal Uncle   . Colon cancer Other        maternal great uncle  . Lung cancer Other        uncles     Social History  reports that he quit smoking about 36 years ago. His smoking use included cigarettes. He has a 5.00 pack-year smoking history. He has never used smokeless tobacco. He reports that he drinks alcohol. He reports that he does not use drugs.  Medications  Current Outpatient Medications:  .  aspirin EC 81 MG tablet, Take 1 tablet (81 mg total) by mouth  every other day. Resume in 1 week, Disp: , Rfl:  .  loratadine (CLARITIN) 10 MG tablet, Take 10 mg by mouth daily as needed. For allergies., Disp: , Rfl:  .  lovastatin (MEVACOR) 20 MG tablet, Take 20 mg by mouth daily., Disp: , Rfl:  .  lovastatin (MEVACOR) 40 MG tablet, , Disp: , Rfl:  .  Multiple Vitamin (MULTIVITAMIN WITH MINERALS) TABS, Take 1 tablet by mouth every morning., Disp: , Rfl:  .  omeprazole (PRILOSEC) 20 MG capsule, TAKE ONE CAPSULE BY MOUTH ONCE DAILY BEFORE BREAKFAST, Disp: 30 capsule, Rfl: 5 .  predniSONE (DELTASONE) 20 MG tablet, Take 2 tablets (40 mg total) by mouth daily with breakfast., Disp: 10 tablet, Rfl: 0  Allergies Patient has no known allergies.  Review of Systems Review of Systems - Oncology ROS as per HPI otherwise 12 point ROS is negative.   Physical Exam  Vitals Wt Readings from Last 3 Encounters:  04/18/18 196 lb 3.2 oz (89 kg)  01/30/18 185 lb (83.9 kg)  04/18/17 198 lb 3.2 oz (89.9 kg)   Temp Readings from Last 3 Encounters:  04/18/18 (!) 97.5 F (36.4 C) (Oral)  01/29/18 98.5 F (36.9 C) (Oral)  04/18/17 97.6 F (36.4 C) (Oral)   BP Readings from Last 3 Encounters:  04/18/18 126/68  01/29/18 (!) 162/92  04/18/17 134/70   Pulse Readings from Last 3 Encounters:  04/18/18 (!) 51  01/29/18 85  04/18/17 (!) 52   Constitutional: Well-developed, well-nourished, and in no distress.   HENT: Head:  Normocephalic and atraumatic.  Mouth/Throat: No oropharyngeal exudate. Mucosa moist. Eyes: Pupils are equal, round, and reactive to light. Conjunctivae are normal. No scleral icterus.  Neck: Normal range of motion. Neck supple. No JVD present.  Cardiovascular: Normal rate, regular rhythm and normal heart sounds.  Exam reveals no gallop and no friction rub.   No murmur heard. Pulmonary/Chest: Effort normal and breath sounds normal. No respiratory distress. No wheezes.No rales.  Abdominal: Soft. Bowel sounds are normal. No distension. There is no  tenderness. There is no guarding. Umbilical hernia.   Musculoskeletal: No edema or tenderness.  Lymphadenopathy:No cervical, axillary or supraclavicular adenopathy.  Neurological: Alert and oriented to person, place, and time. No cranial nerve deficit.  Skin: Skin is warm and dry. No rash noted. No erythema. No pallor.  Psychiatric: Affect and judgment normal.   Labs Appointment on 04/18/2018  Component Date Value Ref Range Status  . WBC 04/18/2018 5.2  4.0 - 10.5 K/uL Final  . RBC 04/18/2018 4.45  4.22 - 5.81 MIL/uL Final  . Hemoglobin 04/18/2018 12.9* 13.0 - 17.0 g/dL Final  . HCT 04/18/2018 39.6  39.0 - 52.0 % Final  . MCV 04/18/2018 89.0  78.0 - 100.0 fL Final  . MCH 04/18/2018 29.0  26.0 - 34.0 pg Final  . MCHC 04/18/2018 32.6  30.0 - 36.0 g/dL Final  . RDW 04/18/2018 12.6  11.5 - 15.5 % Final  . Platelets 04/18/2018 169  150 - 400 K/uL Final  . Neutrophils Relative % 04/18/2018 62  % Final  . Neutro Abs 04/18/2018 3.1  1.7 - 7.7 K/uL Final  . Lymphocytes Relative 04/18/2018 29  % Final  . Lymphs Abs 04/18/2018 1.5  0.7 - 4.0 K/uL Final  . Monocytes Relative 04/18/2018 5  % Final  . Monocytes Absolute 04/18/2018 0.3  0.1 - 1.0 K/uL Final  . Eosinophils Relative 04/18/2018 4  % Final  . Eosinophils Absolute 04/18/2018 0.2  0.0 - 0.7 K/uL Final  . Basophils Relative 04/18/2018 0  % Final  . Basophils Absolute 04/18/2018 0.0  0.0 - 0.1 K/uL Final   Performed at [facility], [address], [place], [place] [address]  . Sodium 04/18/2018 139  135 - 145 mmol/L Final  . Potassium 04/18/2018 3.8  3.5 - 5.1 mmol/L Final  . Chloride 04/18/2018 104  101 - 111 mmol/L Final  . CO2 04/18/2018 27  22 - 32 mmol/L Final  . Glucose, Bld 04/18/2018 139* 65 - 99 mg/dL Final  . BUN 04/18/2018 13  6 - 20 mg/dL Final  . Creatinine, Ser 04/18/2018 0.96  0.61 - 1.24 mg/dL Final  . Calcium 04/18/2018 9.2  8.9 - 10.3 mg/dL Final  . Total Protein 04/18/2018 7.4  6.5 - 8.1 g/dL Final  . Albumin  04/18/2018 4.0  3.5 - 5.0 g/dL Final  . AST 04/18/2018 35  15 - 41 U/L Final  . ALT 04/18/2018 30  17 - 63 U/L Final  . Alkaline Phosphatase 04/18/2018 75  38 - 126 U/L Final  . Total Bilirubin 04/18/2018 1.0  0.3 - 1.2 mg/dL Final  . GFR calc non Af Amer 04/18/2018 >60  >60 mL/min Final  . GFR calc Af Amer 04/18/2018 >60  >60 mL/min Final   Comment: (NOTE) The eGFR has been calculated using the CKD EPI equation. This calculation has not been validated in all clinical situations. eGFR's persistently <60 mL/min signify possible Chronic Kidney Disease.   . Anion gap 04/18/2018 8  5 - 15 Final  Performed at [facility], [address], [place], [place] [address]     Pathology Orders Placed This Encounter  Procedures  . CBC with Differential/Platelet    Standing Status:   Future    Standing Expiration Date:   04/18/2020  . Comprehensive metabolic panel    Standing Status:   Future    Standing Expiration Date:   04/18/2020  . Lactate dehydrogenase    Standing Status:   Future    Standing Expiration Date:   04/18/2020  . Chromogranin A    Standing Status:   Future    Standing Expiration Date:   04/18/2020       Zoila Shutter MD

## 2018-04-22 LAB — CHROMOGRANIN A: Chromogranin A: <1 nmol/L (ref 0–5)

## 2018-04-22 LAB — SEROTONIN SERUM: SEROTONIN, SERUM: 147 ng/mL (ref 21–321)

## 2018-05-07 ENCOUNTER — Other Ambulatory Visit: Payer: Self-pay | Admitting: Gastroenterology

## 2018-11-09 ENCOUNTER — Encounter (HOSPITAL_COMMUNITY): Payer: Self-pay | Admitting: Emergency Medicine

## 2018-11-09 ENCOUNTER — Other Ambulatory Visit: Payer: Self-pay

## 2018-11-09 ENCOUNTER — Emergency Department (HOSPITAL_COMMUNITY)
Admission: EM | Admit: 2018-11-09 | Discharge: 2018-11-09 | Disposition: A | Discharge status: Home / Self Care | Payer: Medicare HMO | Attending: Emergency Medicine | Admitting: Emergency Medicine

## 2018-11-09 DIAGNOSIS — Z87891 Personal history of nicotine dependence: Secondary | ICD-10-CM | POA: Diagnosis not present

## 2018-11-09 DIAGNOSIS — Z7982 Long term (current) use of aspirin: Secondary | ICD-10-CM | POA: Insufficient documentation

## 2018-11-09 DIAGNOSIS — Z79899 Other long term (current) drug therapy: Secondary | ICD-10-CM | POA: Insufficient documentation

## 2018-11-09 DIAGNOSIS — R030 Elevated blood-pressure reading, without diagnosis of hypertension: Secondary | ICD-10-CM | POA: Insufficient documentation

## 2018-11-09 DIAGNOSIS — Z0130 Encounter for examination of blood pressure without abnormal findings: Secondary | ICD-10-CM

## 2018-11-09 NOTE — ED Triage Notes (Signed)
BP was 182/85 today.  Denies any other signs or symptoms

## 2018-11-09 NOTE — ED Notes (Signed)
TT in to assess 

## 2018-11-09 NOTE — ED Provider Notes (Signed)
[facility] EMERGENCY DEPARTMENT Provider Note   CSN: [id number] Arrival date & time: 11/09/18  1810     History   Chief Complaint Chief Complaint  Patient presents with  . Hypertension    HPI Tim Cunningham is a 67 y.o. male.  HPI  Tim Cunningham is a 67 y.o. male who presents to the Emergency Department due to family's concern about his blood pressure.  He was seen by his PCP earlier this week and BP was 140/90 in the office.  He was told to monitor BP at home and keep a log of his BP.  Today, family states they checked his BP and it was 182/85 which prompted this ER visit.  Family admits that reading was after he had been parking cars at a funeral.  Also admit to increased stress at home recently.  Patient denies any symptoms including visual changes, dizziness, chest pain, headache, shortness of breath or peripheral edema.  He is suppose to monitor his pressure for 2 weeks and follow up with PCP.     Past Medical History:  Diagnosis Date  . 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 GERD (gastroesophageal reflux disease)   . Hyperlipidemia   . Lower GI bleeding APR 2016 TCS RMR   DUE TO CECAL DIVERTICULUM REQUIRED CLIPS/EPI    Patient Active Problem List   Diagnosis Date Noted  . Healthcare maintenance 10/19/2016  . GERD (gastroesophageal reflux disease) 03/30/2016  . Diverticulosis of colon with hemorrhage   . Hyperlipidemia   . Acute blood loss anemia   . Lower GI bleeding DUE TO DIVERTICULUM 03/14/2015  . GIB (gastrointestinal bleeding) 03/14/2015  . Colon cancer screening 01/23/2013  . Carcinoid tumor of rectum 12/25/2012  . Umbilical hernia without obstruction or gangrene 10/15/2012  . Heme positive stool 10/15/2012    Past Surgical History:  Procedure Laterality Date  . COLONOSCOPY  11/01/2012   Dr. Oneida Alar: SML IH, RECTAL CARCINOID, HYPEPRPLASTIC POLYPS(2), Moderate TICS/Small lipoma IN HF  . COLONOSCOPY N/A 03/15/2015   Dr. Gala Romney: Pancolonic diverticulosis.  Arterial bleeding arising from a cecal diverticulum status post hemostasis clip placement and injection therapy . Stie of prior rectal mucosal tattooing identified with no evidence of recurrent carcinoid tumor. No EGD done today,.  . ESOPHAGOGASTRODUODENOSCOPY  11/01/2012   Dr. Fields:Schatzki ring was found/MILD GASTRITIS/DUODENIIS  . EUS  12/11/2012   RECTAL CARCINOID EXCISED-MARGINS NOT CLEAR  . FLEXIBLE SIGMOIDOSCOPY  Jan 2016   WITH EUS. Dr. Paulita Fujita. NO residual lesion identified  . TONSILLECTOMY        Home Medications    Prior to Admission medications   Medication Sig Start Date End Date Taking? Authorizing Provider  aspirin EC 81 MG tablet Take 1 tablet (81 mg total) by mouth every other day. Resume in 1 week 03/16/15   Kathie Dike, MD  loratadine (CLARITIN) 10 MG tablet Take 10 mg by mouth daily as needed. For allergies.    [provider]  lovastatin (MEVACOR) 20 MG tablet Take 20 mg by mouth daily. 09/21/16   [provider]  lovastatin (MEVACOR) 40 MG tablet  01/24/18   [provider]  Multiple Vitamin (MULTIVITAMIN WITH MINERALS) TABS Take 1 tablet by mouth every morning.    [provider]  omeprazole (PRILOSEC) 20 MG capsule TAKE 1 CAPSULE BY MOUTH ONCE DAILY BEFORE  BREAKFAST 05/09/18   Annitta Needs, NP  predniSONE (DELTASONE) 20 MG tablet Take 2 tablets (40 mg total) by mouth daily with breakfast.  01/30/18   Orpah Greek, MD    Family History Family History  Problem Relation Age of Onset  . Cancer Mother   . Cancer Maternal Uncle   . Colon cancer Other        maternal great uncle  . Lung cancer Other        uncles    Social History Social History   Tobacco Use  . Smoking status: Former Smoker    Packs/day: 0.50    Years: 10.00    Pack years: 5.00    Types: Cigarettes    Last attempt to quit: 11/01/1981    Years since quitting: 37.0  . Smokeless tobacco: Never Used  . Tobacco comment: quit 1999  Substance Use  Topics  . Alcohol use: Yes    Comment: 1-2 drinks a month  . Drug use: No     Allergies   Patient has no known allergies.   Review of Systems Review of Systems  Constitutional: Negative for chills, fatigue and fever.  HENT: Negative for trouble swallowing.   Eyes: Negative for visual disturbance.  Respiratory: Negative for chest tightness and shortness of breath.   Cardiovascular: Negative for chest pain and palpitations.  Gastrointestinal: Negative for abdominal pain, nausea and vomiting.  Genitourinary: Negative for dysuria.  Musculoskeletal: Negative for myalgias and neck pain.  Skin: Negative for rash.  Neurological: Negative for dizziness, syncope, speech difficulty, weakness, light-headedness, numbness and headaches.  Hematological: Does not bruise/bleed easily.  Psychiatric/Behavioral: Negative for confusion and decreased concentration.     Physical Exam Updated Vital Signs BP (!) 164/78 (BP Location: Right Arm)   Pulse 60   Temp 97.6 F (36.4 C) (Oral)   Resp 18   Ht 5\' 7"  (1.702 m)   Wt 90.7 kg   SpO2 97%   BMI 31.32 kg/m   Physical Exam Vitals signs and nursing note reviewed.  Constitutional:      General: He is not in acute distress.    Appearance: Normal appearance. He is not ill-appearing or toxic-appearing.  HENT:     Head: Normocephalic.     Mouth/Throat:     Mouth: Mucous membranes are moist.     Pharynx: Oropharynx is clear. No oropharyngeal exudate.  Eyes:     Pupils: Pupils are equal, round, and reactive to light.  Neck:     Musculoskeletal: Normal range of motion and neck supple.     Thyroid: No thyromegaly.     Meningeal: Kernig's sign absent.  Cardiovascular:     Rate and Rhythm: Normal rate and regular rhythm.     Pulses: Normal pulses.     Heart sounds: Normal heart sounds.  Pulmonary:     Effort: Pulmonary effort is normal.     Breath sounds: Normal breath sounds. No wheezing.  Abdominal:     Palpations: Abdomen is soft.      Tenderness: There is no abdominal tenderness. There is no guarding or rebound.  Musculoskeletal: Normal range of motion.     Right lower leg: No edema.     Left lower leg: No edema.  Skin:    General: Skin is warm and dry.     Capillary Refill: Capillary refill takes less than 2 seconds.     Findings: No rash.  Neurological:     General: No focal deficit present.     Mental Status: He is alert and oriented to person, place, and time. Mental status is at baseline.     Motor: No weakness.  Gait: Gait normal.  Psychiatric:        Mood and Affect: Mood normal.      ED Treatments / Results  Labs (all labs ordered are listed, but only abnormal results are displayed) Labs Reviewed - No data to display  EKG None  Radiology No results found.  Procedures Procedures (including critical care time)  Medications Ordered in ED Medications - No data to display   Initial Impression / Assessment and Plan / ED Course  I have reviewed the triage vital signs and the nursing notes.  Pertinent labs & imaging results that were available during my care of the patient were reviewed by me and considered in my medical decision making (see chart for details).      Blood pressure (!) 146/81, pulse (!) 57, temperature 97.6 F (36.4 C), temperature source Oral, resp. rate 16, height 5\' 7"  (1.702 m), weight 90.7 kg, SpO2 97 %.   Pt well appearing and here due to family's concern about his BP.  He is asymptomatic.  Admits to recent stressors at home and family had several questions about dietary changes, how to accurately measure his blood pressure and other factors that could influence his blood pressure.  I do not feel that he needs medication at this time and he does agree to lifestyle changes and close outpatient follow-up.  Patient and family reassured and all questions were answered.  Appears appropriate for discharge home.  Return precautions discussed   Final Clinical Impressions(s) / ED  Diagnoses   Final diagnoses:  Blood pressure check    ED Discharge Orders    None       Kem Parkinson, [name] 11/09/18 [id number]    Milton Ferguson, MD 11/10/18 1547

## 2018-11-09 NOTE — Discharge Instructions (Signed)
Continue to monitor your blood pressure every day.  Try to take your blood pressure in the same arm around the same time each day.  Continue to exercise and avoid processed meats and excessive salt intake.  Follow-up with your primary doctor for recheck or return to the ER if needed.

## 2019-01-13 ENCOUNTER — Other Ambulatory Visit: Payer: Self-pay | Admitting: Internal Medicine

## 2019-01-13 DIAGNOSIS — D3A026 Benign carcinoid tumor of the rectum: Secondary | ICD-10-CM

## 2019-04-15 ENCOUNTER — Inpatient Hospital Stay (HOSPITAL_COMMUNITY): Payer: Medicare HMO | Attending: Hematology

## 2019-04-15 ENCOUNTER — Other Ambulatory Visit: Payer: Self-pay

## 2019-04-15 DIAGNOSIS — C7A026 Malignant carcinoid tumor of the rectum: Secondary | ICD-10-CM | POA: Insufficient documentation

## 2019-04-15 DIAGNOSIS — D3A026 Benign carcinoid tumor of the rectum: Secondary | ICD-10-CM

## 2019-04-15 LAB — CBC WITH DIFFERENTIAL/PLATELET
Abs Immature Granulocytes: 0.03 10*3/uL (ref 0.00–0.07)
Basophils Absolute: 0 10*3/uL (ref 0.0–0.1)
Basophils Relative: 1 %
Eosinophils Absolute: 0.3 10*3/uL (ref 0.0–0.5)
Eosinophils Relative: 4 %
HCT: 40 % (ref 39.0–52.0)
Hemoglobin: 12.8 g/dL — ABNORMAL LOW (ref 13.0–17.0)
Immature Granulocytes: 1 %
Lymphocytes Relative: 26 %
Lymphs Abs: 1.7 10*3/uL (ref 0.7–4.0)
MCH: 29.4 pg (ref 26.0–34.0)
MCHC: 32 g/dL (ref 30.0–36.0)
MCV: 92 fL (ref 80.0–100.0)
Monocytes Absolute: 0.6 10*3/uL (ref 0.1–1.0)
Monocytes Relative: 9 %
Neutro Abs: 4 10*3/uL (ref 1.7–7.7)
Neutrophils Relative %: 59 %
Platelets: 190 10*3/uL (ref 150–400)
RBC: 4.35 MIL/uL (ref 4.22–5.81)
RDW: 12.1 % (ref 11.5–15.5)
WBC: 6.7 10*3/uL (ref 4.0–10.5)
nRBC: 0 % (ref 0.0–0.2)

## 2019-04-15 LAB — COMPREHENSIVE METABOLIC PANEL
ALT: 33 U/L (ref 0–44)
AST: 31 U/L (ref 15–41)
Albumin: 4.3 g/dL (ref 3.5–5.0)
Alkaline Phosphatase: 79 U/L (ref 38–126)
Anion gap: 10 (ref 5–15)
BUN: 14 mg/dL (ref 8–23)
CO2: 28 mmol/L (ref 22–32)
Calcium: 9.3 mg/dL (ref 8.9–10.3)
Chloride: 101 mmol/L (ref 98–111)
Creatinine, Ser: 0.91 mg/dL (ref 0.61–1.24)
GFR calc Af Amer: >60 mL/min (ref >60)
GFR calc non Af Amer: >60 mL/min (ref >60)
Glucose, Bld: 98 mg/dL (ref 70–99)
Potassium: 3.5 mmol/L (ref 3.5–5.1)
Sodium: 139 mmol/L (ref 135–145)
Total Bilirubin: 0.9 mg/dL (ref 0.3–1.2)
Total Protein: 7.5 g/dL (ref 6.5–8.1)

## 2019-04-15 LAB — LACTATE DEHYDROGENASE: LDH: 161 U/L (ref 98–192)

## 2019-04-16 NOTE — Progress Notes (Signed)
For review.  Please update ordering provider

## 2019-04-22 ENCOUNTER — Encounter (HOSPITAL_COMMUNITY): Payer: Self-pay | Admitting: Hematology

## 2019-04-22 ENCOUNTER — Inpatient Hospital Stay (HOSPITAL_COMMUNITY): Payer: Medicare HMO | Attending: Hematology | Admitting: Hematology

## 2019-04-22 ENCOUNTER — Other Ambulatory Visit: Payer: Self-pay

## 2019-04-22 VITALS — BP 135/54 | HR 62 | Temp 98.2°F | Resp 14 | Wt 197.7 lb

## 2019-04-22 DIAGNOSIS — Z79899 Other long term (current) drug therapy: Secondary | ICD-10-CM | POA: Diagnosis not present

## 2019-04-22 DIAGNOSIS — D3A026 Benign carcinoid tumor of the rectum: Secondary | ICD-10-CM

## 2019-04-22 DIAGNOSIS — C7A026 Malignant carcinoid tumor of the rectum: Secondary | ICD-10-CM | POA: Diagnosis present

## 2019-04-22 DIAGNOSIS — E785 Hyperlipidemia, unspecified: Secondary | ICD-10-CM | POA: Diagnosis not present

## 2019-04-22 DIAGNOSIS — K219 Gastro-esophageal reflux disease without esophagitis: Secondary | ICD-10-CM | POA: Diagnosis not present

## 2019-04-22 DIAGNOSIS — Z809 Family history of malignant neoplasm, unspecified: Secondary | ICD-10-CM | POA: Insufficient documentation

## 2019-04-22 DIAGNOSIS — Z800 Family history of malignant neoplasm of digestive organs: Secondary | ICD-10-CM | POA: Diagnosis not present

## 2019-04-22 DIAGNOSIS — Z87891 Personal history of nicotine dependence: Secondary | ICD-10-CM | POA: Insufficient documentation

## 2019-04-22 DIAGNOSIS — Z7982 Long term (current) use of aspirin: Secondary | ICD-10-CM | POA: Diagnosis not present

## 2019-04-22 DIAGNOSIS — Z801 Family history of malignant neoplasm of trachea, bronchus and lung: Secondary | ICD-10-CM | POA: Diagnosis not present

## 2019-04-22 LAB — CHROMOGRANIN A REBASELINE
Chromogranin A (ng/mL): 58.9 ng/mL (ref 0.0–101.8)
Chromogranin A: <1 nmol/L (ref 0–5)

## 2019-04-22 NOTE — Progress Notes (Signed)
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 male returns for routine follow-up. He is here today alone. He states that he has been doing well since his last visit. Denies any nausea, vomiting, or diarrhea. Denies any new pains. Had not noticed any recent bleeding such as epistaxis, hematuria or hematochezia. Denies recent chest pain on exertion, shortness of breath on minimal exertion, pre-syncopal episodes, or palpitations. Denies any numbness or tingling in hands or feet. Denies any recent fevers, infections, or recent hospitalizations. Patient reports appetite at 100% and energy level at 100%.    REVIEW OF SYSTEMS:  Review of Systems  All other systems reviewed and are negative.    PAST MEDICAL/SURGICAL HISTORY:  Past Medical History:  Diagnosis Date  . 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 GERD (gastroesophageal reflux disease)   . Hyperlipidemia   . Lower GI bleeding APR 2016 TCS RMR   DUE TO CECAL DIVERTICULUM REQUIRED CLIPS/EPI   Past Surgical History:  Procedure Laterality Date  . COLONOSCOPY  11/01/2012   Dr. Oneida Alar: SML IH, RECTAL CARCINOID, HYPEPRPLASTIC POLYPS(2), Moderate TICS/Small lipoma IN HF  . COLONOSCOPY N/A 03/15/2015   Dr. Gala Romney: Pancolonic diverticulosis. Arterial bleeding arising from a cecal diverticulum status post hemostasis clip placement and injection therapy . Stie of prior rectal mucosal tattooing identified with no evidence of recurrent carcinoid tumor. No EGD done today,.  . ESOPHAGOGASTRODUODENOSCOPY  11/01/2012   Dr. Fields:Schatzki ring was found/MILD GASTRITIS/DUODENIIS  . EUS  12/11/2012   RECTAL CARCINOID EXCISED-MARGINS NOT CLEAR  . FLEXIBLE SIGMOIDOSCOPY   Jan 2016   WITH EUS. Dr. Paulita Fujita. NO residual lesion identified  . TONSILLECTOMY       SOCIAL HISTORY:  Social History   Socioeconomic History  . Marital status: Divorced    Spouse name: Not on file  . Number of children: 5  . Years of education: Not on file  . Highest education level: Not on file  Occupational History  . Occupation: unemployed  Social Needs  . Financial resource strain: Not on file  . Food insecurity:    Worry: Not on file    Inability: Not on file  . Transportation needs:    Medical: Not on file    Non-medical: Not on file  Tobacco Use  . Smoking status: Former Smoker    Packs/day: 0.50    Years: 10.00    Pack years: 5.00    Types: Cigarettes    Last attempt to quit: 11/01/1981    Years since quitting: 37.4  . Smokeless tobacco: Never Used  . Tobacco comment: quit 1999  Substance and Sexual Activity  . Alcohol use: Yes    Comment: 1-2 drinks a month  . Drug use: No  . Sexual activity: Yes    Birth control/protection: None  Lifestyle  . Physical activity:    Days per week: Not on file    Minutes per session: Not on file  . Stress: Not on file  Relationships  . Social connections:    Talks on phone: Not on file    Gets together: Not on file    Attends religious service: Not on file    Active member of club or  organization: Not on file    Attends meetings of clubs or organizations: Not on file    Relationship status: Not on file  . Intimate partner violence:    Fear of current or ex partner: Not on file    Emotionally abused: Not on file    Physically abused: Not on file    Forced sexual activity: Not on file  Other Topics Concern  . Not on file  Social History Narrative  . Not on file    FAMILY HISTORY:  Family History  Problem Relation Age of Onset  . Cancer Mother   . Cancer Maternal Uncle   . Colon cancer Other        maternal great uncle  . Lung cancer Other        uncles    CURRENT MEDICATIONS:  Outpatient Encounter  Medications as of 04/22/2019  Medication Sig Note  . aspirin EC 81 MG tablet Take 1 tablet (81 mg total) by mouth every other day. Resume in 1 week 10/19/2016: Takes every other day  . hydrochlorothiazide (HYDRODIURIL) 25 MG tablet Take 25 mg by mouth daily. for high blood pressure   . levothyroxine (SYNTHROID) 25 MCG tablet TAKE 1 TABLET BY MOUTH ONCE DAILY FOR LOW THYROID   . loratadine (CLARITIN) 10 MG tablet Take 10 mg by mouth daily as needed. For allergies.   Marland Kitchen losartan (COZAAR) 100 MG tablet Take 100 mg by mouth daily. for high blood pressure   . lovastatin (MEVACOR) 20 MG tablet Take 20 mg by mouth daily. 10/19/2016: Received from: External Pharmacy  . Multiple Vitamin (MULTIVITAMIN WITH MINERALS) TABS Take 1 tablet by mouth every morning.   Marland Kitchen omeprazole (PRILOSEC) 20 MG capsule TAKE 1 CAPSULE BY MOUTH ONCE DAILY BEFORE  BREAKFAST   . predniSONE (DELTASONE) 20 MG tablet Take 2 tablets (40 mg total) by mouth daily with breakfast.   . [DISCONTINUED] lovastatin (MEVACOR) 40 MG tablet     No facility-administered encounter medications on file as of 04/22/2019.     ALLERGIES:  No Known Allergies   PHYSICAL EXAM:  ECOG Performance status: 1  Vitals:   04/22/19 1424  BP: (!) 135/54  Pulse: 62  Resp: 14  Temp: 98.2 F (36.8 C)  SpO2: 99%   Filed Weights   04/22/19 1424  Weight: 197 lb 11.2 oz (89.7 kg)    Physical Exam Vitals signs reviewed.  Constitutional:      Appearance: Normal appearance.  Cardiovascular:     Rate and Rhythm: Normal rate and regular rhythm.     Heart sounds: Normal heart sounds.  Pulmonary:     Effort: Pulmonary effort is normal.     Breath sounds: Normal breath sounds.  Abdominal:     General: There is no distension.     Palpations: Abdomen is soft. There is no mass.  Musculoskeletal:        General: No swelling.  Skin:    General: Skin is warm.  Neurological:     General: No focal deficit present.     Mental Status: He is alert and oriented  to person, place, and time.  Psychiatric:        Mood and Affect: Mood normal.        Behavior: Behavior normal.      LABORATORY DATA:  I have reviewed the labs as listed.  CBC    Component Value Date/Time   WBC 6.7 04/15/2019 1252   RBC 4.35 04/15/2019 1252   HGB 12.8 (  L) 04/15/2019 1252   HCT 40.0 04/15/2019 1252   HCT 41 09/02/2012 1056   PLT 190 04/15/2019 1252   MCV 92.0 04/15/2019 1252   MCV 86.4 09/02/2012 1056   MCH 29.4 04/15/2019 1252   MCHC 32.0 04/15/2019 1252   RDW 12.1 04/15/2019 1252   LYMPHSABS 1.7 04/15/2019 1252   MONOABS 0.6 04/15/2019 1252   EOSABS 0.3 04/15/2019 1252   BASOSABS 0.0 04/15/2019 1252   CMP Latest Ref Rng & Units 04/15/2019 04/18/2018 04/18/2017  Glucose 70 - 99 mg/dL 98 139(H) 100(H)  BUN 8 - 23 mg/dL 14 13 11   Creatinine 0.61 - 1.24 mg/dL 0.91 0.96 0.97  Sodium 135 - 145 mmol/L 139 139 138  Potassium 3.5 - 5.1 mmol/L 3.5 3.8 3.8  Chloride 98 - 111 mmol/L 101 104 105  CO2 22 - 32 mmol/L 28 27 27   Calcium 8.9 - 10.3 mg/dL 9.3 9.2 9.2  Total Protein 6.5 - 8.1 g/dL 7.5 7.4 7.3  Total Bilirubin 0.3 - 1.2 mg/dL 0.9 1.0 0.7  Alkaline Phos 38 - 126 U/L 79 75 84  AST 15 - 41 U/L 31 35 40  ALT 0 - 44 U/L 33 30 41       DIAGNOSTIC IMAGING:  I have independently reviewed the scans and discussed with the patient.   I have reviewed Venita Lick LPN's note and agree with the documentation.  I personally performed a face-to-face visit, made revisions and my assessment and plan is as follows.    ASSESSMENT & PLAN:   Carcinoid tumor of rectum 1.  Low-grade carcinoid of the rectum: - Rectal polyp removed on 12/11/2012 shows low-grade, well-differentiated neuroendocrine tumor, 0.8 cm, tumor focally extending to cauterized polypectomy margin. -CT abdomen pelvis on 08/26/2013 showed no evidence of metastatic disease. -Colonoscopy on 03/15/2015 shows pancolonic diverticulosis.  Arterial bleeding arising from a cecal diverticulum status post  hemostasis clip placement and injection therapy.  Site of prior rectal mucosa tattooing identified with no evidence of recurrent carcinoid tumor. -I have reviewed his blood work.  Serum chromogranin and LFTs were normal. -Physical exam was within normal limits.  He does not have any signs or symptoms of carcinoid syndrome. - We will see him back in a year for follow-up with repeat blood work.      Orders placed this encounter:  Orders Placed This Encounter  Procedures  . CBC with Differential/Platelet  . Comprehensive metabolic panel  . Chromogranin A      Derek Jack, MD [place] [phone]

## 2019-04-22 NOTE — Assessment & Plan Note (Signed)
1.  Low-grade carcinoid of the rectum: - Rectal polyp removed on 12/11/2012 shows low-grade, well-differentiated neuroendocrine tumor, 0.8 cm, tumor focally extending to cauterized polypectomy margin. -CT abdomen pelvis on 08/26/2013 showed no evidence of metastatic disease. -Colonoscopy on 03/15/2015 shows pancolonic diverticulosis.  Arterial bleeding arising from a cecal diverticulum status post hemostasis clip placement and injection therapy.  Site of prior rectal mucosa tattooing identified with no evidence of recurrent carcinoid tumor. -I have reviewed his blood work.  Serum chromogranin and LFTs were normal. -Physical exam was within normal limits.  He does not have any signs or symptoms of carcinoid syndrome. - We will see him back in a year for follow-up with repeat blood work.

## 2019-04-22 NOTE — Patient Instructions (Addendum)
[place] at [facility] Discharge Instructions  You were seen today by Dr. Delton Coombes. He went over your recent lab and scan results, everything looked good, no signs of reccurence. He will see you back in 1 year for labs and follow up.   Thank you for choosing [place] at [facility] to provide your oncology and hematology care.  To afford each patient quality time with our provider, please arrive at least 15 minutes before your scheduled appointment time.   If you have a lab appointment with the [facility] please come in thru the  Main Entrance and check in at the main information desk  You need to re-schedule your appointment should you arrive 10 or more minutes late.  We strive to give you quality time with our providers, and arriving late affects you and other patients whose appointments are after yours.  Also, if you no show three or more times for appointments you may be dismissed from the clinic at the providers discretion.     Again, thank you for choosing [facility].  Our hope is that these requests will decrease the amount of time that you wait before being seen by our physicians.       _____________________________________________________________  Should you have questions after your visit to [facility], please contact our office at [phone] between the hours of 8:00 a.m. and 4:30 p.m.  Voicemails left after 4:00 p.m. will not be returned until the following business day.  For prescription refill requests, have your pharmacy contact our office and allow 72 hours.    Cancer Center Support Programs:   > Cancer Support Group  2nd Tuesday of the month 1pm-2pm, Journey Room

## 2019-04-23 NOTE — Progress Notes (Signed)
For review.  Please update ordering provider.  Schedule follow-up with Dr. Raliegh Ip or Lala Lund as indicated.

## 2020-04-26 ENCOUNTER — Other Ambulatory Visit (HOSPITAL_COMMUNITY): Payer: Self-pay | Admitting: *Deleted

## 2020-04-26 DIAGNOSIS — D3A026 Benign carcinoid tumor of the rectum: Secondary | ICD-10-CM

## 2020-04-27 ENCOUNTER — Other Ambulatory Visit: Payer: Self-pay

## 2020-04-27 ENCOUNTER — Inpatient Hospital Stay (HOSPITAL_COMMUNITY): Payer: Medicare HMO | Attending: Hematology

## 2020-04-27 DIAGNOSIS — C7A026 Malignant carcinoid tumor of the rectum: Secondary | ICD-10-CM | POA: Diagnosis not present

## 2020-04-27 DIAGNOSIS — Z7952 Long term (current) use of systemic steroids: Secondary | ICD-10-CM | POA: Insufficient documentation

## 2020-04-27 DIAGNOSIS — D3A026 Benign carcinoid tumor of the rectum: Secondary | ICD-10-CM

## 2020-04-27 DIAGNOSIS — Z800 Family history of malignant neoplasm of digestive organs: Secondary | ICD-10-CM | POA: Insufficient documentation

## 2020-04-27 DIAGNOSIS — Z87891 Personal history of nicotine dependence: Secondary | ICD-10-CM | POA: Diagnosis not present

## 2020-04-27 DIAGNOSIS — Z801 Family history of malignant neoplasm of trachea, bronchus and lung: Secondary | ICD-10-CM | POA: Diagnosis not present

## 2020-04-27 DIAGNOSIS — I509 Heart failure, unspecified: Secondary | ICD-10-CM | POA: Diagnosis not present

## 2020-04-27 DIAGNOSIS — Z79899 Other long term (current) drug therapy: Secondary | ICD-10-CM | POA: Insufficient documentation

## 2020-04-27 DIAGNOSIS — K219 Gastro-esophageal reflux disease without esophagitis: Secondary | ICD-10-CM | POA: Diagnosis not present

## 2020-04-27 DIAGNOSIS — E785 Hyperlipidemia, unspecified: Secondary | ICD-10-CM | POA: Insufficient documentation

## 2020-04-27 LAB — COMPREHENSIVE METABOLIC PANEL
ALT: 37 U/L (ref 0–44)
AST: 31 U/L (ref 15–41)
Albumin: 4.1 g/dL (ref 3.5–5.0)
Alkaline Phosphatase: 71 U/L (ref 38–126)
Anion gap: 13 (ref 5–15)
BUN: 16 mg/dL (ref 8–23)
CO2: 26 mmol/L (ref 22–32)
Calcium: 9.3 mg/dL (ref 8.9–10.3)
Chloride: 100 mmol/L (ref 98–111)
Creatinine, Ser: 0.92 mg/dL (ref 0.61–1.24)
GFR calc Af Amer: >60 mL/min (ref >60)
GFR calc non Af Amer: >60 mL/min (ref >60)
Glucose, Bld: 118 mg/dL — ABNORMAL HIGH (ref 70–99)
Potassium: 3.7 mmol/L (ref 3.5–5.1)
Sodium: 139 mmol/L (ref 135–145)
Total Bilirubin: 0.6 mg/dL (ref 0.3–1.2)
Total Protein: 7.3 g/dL (ref 6.5–8.1)

## 2020-04-27 LAB — CBC WITH DIFFERENTIAL/PLATELET
Abs Immature Granulocytes: 0.01 10*3/uL (ref 0.00–0.07)
Basophils Absolute: 0 10*3/uL (ref 0.0–0.1)
Basophils Relative: 1 %
Eosinophils Absolute: 0.2 10*3/uL (ref 0.0–0.5)
Eosinophils Relative: 4 %
HCT: 39.7 % (ref 39.0–52.0)
Hemoglobin: 13.2 g/dL (ref 13.0–17.0)
Immature Granulocytes: 0 %
Lymphocytes Relative: 26 %
Lymphs Abs: 1.6 10*3/uL (ref 0.7–4.0)
MCH: 30.1 pg (ref 26.0–34.0)
MCHC: 33.2 g/dL (ref 30.0–36.0)
MCV: 90.6 fL (ref 80.0–100.0)
Monocytes Absolute: 0.7 10*3/uL (ref 0.1–1.0)
Monocytes Relative: 11 %
Neutro Abs: 3.8 10*3/uL (ref 1.7–7.7)
Neutrophils Relative %: 58 %
Platelets: 186 10*3/uL (ref 150–400)
RBC: 4.38 MIL/uL (ref 4.22–5.81)
RDW: 12.6 % (ref 11.5–15.5)
WBC: 6.3 10*3/uL (ref 4.0–10.5)
nRBC: 0 % (ref 0.0–0.2)

## 2020-04-27 LAB — LACTATE DEHYDROGENASE: LDH: 174 U/L (ref 98–192)

## 2020-04-29 LAB — CHROMOGRANIN A: Chromogranin A (ng/mL): 63.8 ng/mL (ref 0.0–101.8)

## 2020-04-30 LAB — SEROTONIN SERUM: Serotonin, Serum: 147 ng/mL (ref 21–321)

## 2020-05-04 ENCOUNTER — Other Ambulatory Visit: Payer: Self-pay

## 2020-05-04 ENCOUNTER — Inpatient Hospital Stay (HOSPITAL_COMMUNITY): Payer: Medicare HMO | Admitting: Nurse Practitioner

## 2020-05-04 DIAGNOSIS — C7A026 Malignant carcinoid tumor of the rectum: Secondary | ICD-10-CM | POA: Diagnosis not present

## 2020-05-04 NOTE — Progress Notes (Signed)
[place], [place] [address]   CLINIC:  Medical Oncology/Hematology  PCP:  Raiford Simmonds., [name] [address] [place] [address] [place] [address] [phone]   REASON FOR VISIT: Follow-up for carcinoid tumor   CURRENT THERAPY: Surveillance   INTERVAL HISTORY:  [name] 69 y.o. male returns for routine follow-up for carcinoid tumor.  Patient reports he has been doing well since his last visit.  He denies any bleeding per rectum or melena.  He denies any other bruising or bleeding.  He denies any abdominal pain.  He denies any diarrhea. Denies any nausea, vomiting, or diarrhea. Denies any new pains. Had not noticed any recent bleeding such as epistaxis, hematuria or hematochezia. Denies recent chest pain on exertion, shortness of breath on minimal exertion, pre-syncopal episodes, or palpitations. Denies any numbness or tingling in hands or feet. Denies any recent fevers, infections, or recent hospitalizations. Patient reports appetite at 100% and energy level at 75%.  He is eating well maintain his weight at this time.     REVIEW OF SYSTEMS:  Review of Systems  All other systems reviewed and are negative.    PAST MEDICAL/SURGICAL HISTORY:  Past Medical History:  Diagnosis Date  . 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 GERD (gastroesophageal reflux disease)   . Hyperlipidemia   . Lower GI bleeding APR 2016 TCS RMR   DUE TO CECAL DIVERTICULUM REQUIRED CLIPS/EPI   Past Surgical History:  Procedure Laterality Date  . COLONOSCOPY  11/01/2012   Dr. Oneida Alar: SML IH, RECTAL CARCINOID, HYPEPRPLASTIC POLYPS(2), Moderate TICS/Small lipoma IN HF  . COLONOSCOPY N/A 03/15/2015   Dr. Gala Romney: Pancolonic diverticulosis. Arterial bleeding arising from a cecal diverticulum status post hemostasis clip placement and injection therapy . Stie of prior rectal mucosal tattooing identified with no evidence of recurrent carcinoid tumor. No EGD done today,.  .  ESOPHAGOGASTRODUODENOSCOPY  11/01/2012   Dr. Fields:Schatzki ring was found/MILD GASTRITIS/DUODENIIS  . EUS  12/11/2012   RECTAL CARCINOID EXCISED-MARGINS NOT CLEAR  . FLEXIBLE SIGMOIDOSCOPY  Jan 2016   WITH EUS. Dr. Paulita Fujita. NO residual lesion identified  . TONSILLECTOMY       SOCIAL HISTORY:  Social History   Socioeconomic History  . Marital status: Divorced    Spouse name: Not on file  . Number of children: 5  . Years of education: Not on file  . Highest education level: Not on file  Occupational History  . Occupation: unemployed  Tobacco Use  . Smoking status: Former Smoker    Packs/day: 0.50    Years: 10.00    Pack years: 5.00    Types: Cigarettes    Quit date: 11/01/1981    Years since quitting: 38.5  . Smokeless tobacco: Never Used  . Tobacco comment: quit 1999  Substance and Sexual Activity  . Alcohol use: Yes    Comment: 1-2 drinks a month  . Drug use: No  . Sexual activity: Yes    Birth control/protection: None  Other Topics Concern  . Not on file  Social History Narrative  . Not on file   Social Determinants of Health   Financial Resource Strain:   . Difficulty of Paying Living Expenses:   Food Insecurity:   . Worried About Charity fundraiser in the Last Year:   . Arboriculturist in the Last Year:   Transportation Needs:   . Film/video editor (Medical):   Marland Kitchen Lack of Transportation (Non-Medical):  Physical Activity:   . Days of Exercise per Week:   . Minutes of Exercise per Session:   Stress:   . Feeling of Stress :   Social Connections:   . Frequency of Communication with Friends and Family:   . Frequency of Social Gatherings with Friends and Family:   . Attends Religious Services:   . Active Member of Clubs or Organizations:   . Attends Archivist Meetings:   Marland Kitchen Marital Status:   Intimate Partner Violence:   . Fear of Current or Ex-Partner:   . Emotionally Abused:   Marland Kitchen Physically Abused:   . Sexually Abused:     FAMILY  HISTORY:  Family History  Problem Relation Age of Onset  . Cancer Mother   . Cancer Maternal Uncle   . Colon cancer Other        maternal great uncle  . Lung cancer Other        uncles    CURRENT MEDICATIONS:  Outpatient Encounter Medications as of 05/04/2020  Medication Sig Note  . aspirin EC 81 MG tablet Take 1 tablet (81 mg total) by mouth every other day. Resume in 1 week 10/19/2016: Takes every other day  . hydrochlorothiazide (HYDRODIURIL) 25 MG tablet Take 25 mg by mouth daily. for high blood pressure   . levothyroxine (SYNTHROID) 25 MCG tablet TAKE 1 TABLET BY MOUTH ONCE DAILY FOR LOW THYROID   . loratadine (CLARITIN) 10 MG tablet Take 10 mg by mouth daily as needed. For allergies.   Marland Kitchen losartan (COZAAR) 100 MG tablet Take 100 mg by mouth daily. for high blood pressure   . lovastatin (MEVACOR) 20 MG tablet Take 20 mg by mouth daily. 10/19/2016: Received from: External Pharmacy  . Multiple Vitamin (MULTIVITAMIN WITH MINERALS) TABS Take 1 tablet by mouth every morning.   Marland Kitchen omeprazole (PRILOSEC) 20 MG capsule TAKE 1 CAPSULE BY MOUTH ONCE DAILY BEFORE  BREAKFAST   . predniSONE (DELTASONE) 20 MG tablet Take 2 tablets (40 mg total) by mouth daily with breakfast.    No facility-administered encounter medications on file as of 05/04/2020.    ALLERGIES:  No Known Allergies   PHYSICAL EXAM:  ECOG Performance status: 1  Vitals:   05/04/20 1448  BP: 135/64  Pulse: (!) 56  Resp: 18  Temp: 98.1 F (36.7 C)  SpO2: 98%   Filed Weights   05/04/20 1448  Weight: 207 lb 12.8 oz (94.3 kg)   Physical Exam Constitutional:      Appearance: Normal appearance. He is normal weight.  Cardiovascular:     Rate and Rhythm: Normal rate and regular rhythm.     Heart sounds: Normal heart sounds.  Pulmonary:     Effort: Pulmonary effort is normal.     Breath sounds: Normal breath sounds.  Abdominal:     General: Bowel sounds are normal.     Palpations: Abdomen is soft.  Musculoskeletal:         General: Normal range of motion.  Skin:    General: Skin is warm.  Neurological:     Mental Status: He is alert and oriented to person, place, and time. Mental status is at baseline.  Psychiatric:        Mood and Affect: Mood normal.        Behavior: Behavior normal.        Thought Content: Thought content normal.        Judgment: Judgment normal.      LABORATORY DATA:  I  have reviewed the labs as listed.  CBC    Component Value Date/Time   WBC 6.3 04/27/2020 1227   RBC 4.38 04/27/2020 1227   HGB 13.2 04/27/2020 1227   HCT 39.7 04/27/2020 1227   HCT 41 09/02/2012 1056   PLT 186 04/27/2020 1227   MCV 90.6 04/27/2020 1227   MCV 86.4 09/02/2012 1056   MCH 30.1 04/27/2020 1227   MCHC 33.2 04/27/2020 1227   RDW 12.6 04/27/2020 1227   LYMPHSABS 1.6 04/27/2020 1227   MONOABS 0.7 04/27/2020 1227   EOSABS 0.2 04/27/2020 1227   BASOSABS 0.0 04/27/2020 1227   CMP Latest Ref Rng & Units 04/27/2020 04/15/2019 04/18/2018  Glucose 70 - 99 mg/dL 118(H) 98 139(H)  BUN 8 - 23 mg/dL 16 14 13   Creatinine 0.61 - 1.24 mg/dL 0.92 0.91 0.96  Sodium 135 - 145 mmol/L 139 139 139  Potassium 3.5 - 5.1 mmol/L 3.7 3.5 3.8  Chloride 98 - 111 mmol/L 100 101 104  CO2 22 - 32 mmol/L 26 28 27   Calcium 8.9 - 10.3 mg/dL 9.3 9.3 9.2  Total Protein 6.5 - 8.1 g/dL 7.3 7.5 7.4  Total Bilirubin 0.3 - 1.2 mg/dL 0.6 0.9 1.0  Alkaline Phos 38 - 126 U/L 71 79 75  AST 15 - 41 U/L 31 31 35  ALT 0 - 44 U/L 37 33 30    All questions were answered to patient's stated satisfaction. Encouraged patient to call with any new concerns or questions before his next visit to the cancer center and we can certain see him sooner, if needed.     ASSESSMENT & PLAN:  Carcinoid tumor of rectum 1.  Low-grade carcinoid of the rectum: -Rectal polyp removed on 12/11/2012 shows low-grade, well-differentiated neuroendocrine tumor, 0.8 cm, tumor focally extended to cauterized polypectomy margin. -CT abdomen pelvis on 08/26/2013  showed no evidence of metastatic disease. -Colonoscopy on 03/15/2015 shows pancolonic diverticulosis.  Arterial bleeding arising from a cecal diverticulum status post hemostasis clip placement and injection therapy.  Site of prior rectal mucosal tattooing identified with no evidence of recurrent carcinoid tumor. -Physical exam today was within normal limits.  He does not have any signs or symptoms of carcinoid syndrome. -Labs done on 04/27/2020 were all WNL chromogranin level 63.8 -She will follow-up in 1 year with repeat labs.     Orders placed this encounter:  Orders Placed This Encounter  Procedures  . Lactate dehydrogenase  . Chromogranin A  . CBC with Differential/Platelet  . Comprehensive metabolic panel  . Vitamin B12  . VITAMIN D 25 Hydroxy (Vit-D Deficiency, Fractures)      Francene Finders, FNP-C [place] [phone]

## 2020-05-04 NOTE — Patient Instructions (Signed)
[facility] at [facility] Discharge Instructions  Follow up in 1 year with labs    Thank you for choosing [facility] at [facility] to provide your oncology and hematology care.  To afford each patient quality time with our provider, please arrive at least 15 minutes before your scheduled appointment time.   If you have a lab appointment with the Cancer Center please come in thru the Main Entrance and check in at the main information desk.  You need to re-schedule your appointment should you arrive 10 or more minutes late.  We strive to give you quality time with our providers, and arriving late affects you and other patients whose appointments are after yours.  Also, if you no show three or more times for appointments you may be dismissed from the clinic at the providers discretion.     Again, thank you for choosing [facility].  Our hope is that these requests will decrease the amount of time that you wait before being seen by our physicians.       _____________________________________________________________  Should you have questions after your visit to [facility], please contact our office at [phone] between the hours of 8:00 a.m. and 4:30 p.m.  Voicemails left after 4:00 p.m. will not be returned until the following business day.  For prescription refill requests, have your pharmacy contact our office and allow 72 hours.    Due to Covid, you will need to wear a mask upon entering the hospital. If you do not have a mask, a mask will be given to you at the Main Entrance upon arrival. For doctor visits, patients may have 1 support person with them. For treatment visits, patients can not have anyone with them due to social distancing guidelines and our immunocompromised population.      

## 2020-05-04 NOTE — Assessment & Plan Note (Signed)
1.  Low-grade carcinoid of the rectum: -Rectal polyp removed on 12/11/2012 shows low-grade, well-differentiated neuroendocrine tumor, 0.8 cm, tumor focally extended to cauterized polypectomy margin. -CT abdomen pelvis on 08/26/2013 showed no evidence of metastatic disease. -Colonoscopy on 03/15/2015 shows pancolonic diverticulosis.  Arterial bleeding arising from a cecal diverticulum status post hemostasis clip placement and injection therapy.  Site of prior rectal mucosal tattooing identified with no evidence of recurrent carcinoid tumor. -Physical exam today was within normal limits.  He does not have any signs or symptoms of carcinoid syndrome. -Labs done on 04/27/2020 were all WNL chromogranin level 63.8 -She will follow-up in 1 year with repeat labs.

## 2021-04-27 ENCOUNTER — Inpatient Hospital Stay (HOSPITAL_COMMUNITY): Payer: Medicare HMO | Attending: Hematology

## 2021-04-27 ENCOUNTER — Other Ambulatory Visit: Payer: Self-pay

## 2021-04-27 DIAGNOSIS — Z85040 Personal history of malignant carcinoid tumor of rectum: Secondary | ICD-10-CM | POA: Diagnosis not present

## 2021-04-27 DIAGNOSIS — C7A026 Malignant carcinoid tumor of the rectum: Secondary | ICD-10-CM

## 2021-04-27 DIAGNOSIS — Z87891 Personal history of nicotine dependence: Secondary | ICD-10-CM | POA: Diagnosis not present

## 2021-04-27 LAB — CBC WITH DIFFERENTIAL/PLATELET
Abs Immature Granulocytes: 0.02 10*3/uL (ref 0.00–0.07)
Basophils Absolute: 0.1 10*3/uL (ref 0.0–0.1)
Basophils Relative: 1 %
Eosinophils Absolute: 0.4 10*3/uL (ref 0.0–0.5)
Eosinophils Relative: 5 %
HCT: 40.7 % (ref 39.0–52.0)
Hemoglobin: 13.2 g/dL (ref 13.0–17.0)
Immature Granulocytes: 0 %
Lymphocytes Relative: 22 %
Lymphs Abs: 1.5 10*3/uL (ref 0.7–4.0)
MCH: 29.7 pg (ref 26.0–34.0)
MCHC: 32.4 g/dL (ref 30.0–36.0)
MCV: 91.5 fL (ref 80.0–100.0)
Monocytes Absolute: 0.6 10*3/uL (ref 0.1–1.0)
Monocytes Relative: 8 %
Neutro Abs: 4.4 10*3/uL (ref 1.7–7.7)
Neutrophils Relative %: 64 %
Platelets: 172 10*3/uL (ref 150–400)
RBC: 4.45 MIL/uL (ref 4.22–5.81)
RDW: 12.8 % (ref 11.5–15.5)
WBC: 6.8 10*3/uL (ref 4.0–10.5)
nRBC: 0 % (ref 0.0–0.2)

## 2021-04-27 LAB — COMPREHENSIVE METABOLIC PANEL
ALT: 34 U/L (ref 0–44)
AST: 34 U/L (ref 15–41)
Albumin: 4.2 g/dL (ref 3.5–5.0)
Alkaline Phosphatase: 80 U/L (ref 38–126)
Anion gap: 7 (ref 5–15)
BUN: 13 mg/dL (ref 8–23)
CO2: 28 mmol/L (ref 22–32)
Calcium: 9.3 mg/dL (ref 8.9–10.3)
Chloride: 102 mmol/L (ref 98–111)
Creatinine, Ser: 0.99 mg/dL (ref 0.61–1.24)
GFR, Estimated: >60 mL/min (ref >60)
Glucose, Bld: 128 mg/dL — ABNORMAL HIGH (ref 70–99)
Potassium: 3.5 mmol/L (ref 3.5–5.1)
Sodium: 137 mmol/L (ref 135–145)
Total Bilirubin: 0.6 mg/dL (ref 0.3–1.2)
Total Protein: 7.4 g/dL (ref 6.5–8.1)

## 2021-04-27 LAB — VITAMIN B12: Vitamin B-12: 574 pg/mL (ref 180–914)

## 2021-04-27 LAB — VITAMIN D 25 HYDROXY (VIT D DEFICIENCY, FRACTURES): Vit D, 25-Hydroxy: 29.04 ng/mL — ABNORMAL LOW (ref 30–100)

## 2021-04-27 LAB — LACTATE DEHYDROGENASE: LDH: 171 U/L (ref 98–192)

## 2021-04-28 LAB — CHROMOGRANIN A: Chromogranin A (ng/mL): 95.6 ng/mL (ref 0.0–101.8)

## 2021-05-03 NOTE — Progress Notes (Signed)
[place], [place] [address]   CLINIC:  Medical Oncology/Hematology  PCP:  Raiford Simmonds., [name] [address] [place] / [place] [address] [phone]   REASON FOR VISIT:  Follow-up for carcinoid tumor  PRIOR THERAPY: none   NGS Results: not done  CURRENT THERAPY: surveillance  BRIEF ONCOLOGIC HISTORY:  Oncology History   No history exists.    CANCER STAGING: Cancer Staging No matching staging information was found for the patient.  INTERVAL HISTORY:  [name], a 70 y.o. male, returns for routine follow-up of his carcinoid tumor. Burdette was last seen on 05/04/2020.   Today he reports feeling good. He denies diarrhea, wheezing, or flushing.   REVIEW OF SYSTEMS:  Review of Systems  Constitutional:  Negative for appetite change and fatigue.  Respiratory:  Negative for wheezing.   Gastrointestinal:  Negative for diarrhea.  All other systems reviewed and are negative.  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 Oneida Alar: SML IH, RECTAL CARCINOID, HYPEPRPLASTIC POLYPS(2), Moderate TICS/Small lipoma IN HF   COLONOSCOPY N/A 03/15/2015   Dr. Gala Romney: Pancolonic diverticulosis. Arterial bleeding arising from a cecal diverticulum status post hemostasis clip placement and injection therapy . Stie of prior rectal mucosal tattooing identified with no evidence of recurrent carcinoid tumor. No EGD done today,.   ESOPHAGOGASTRODUODENOSCOPY  11/01/2012   Dr. Fields:Schatzki ring was found/MILD GASTRITIS/DUODENIIS   EUS  12/11/2012   RECTAL CARCINOID EXCISED-MARGINS NOT CLEAR   FLEXIBLE SIGMOIDOSCOPY  Jan 2016   WITH EUS. Dr. Paulita Fujita. NO  residual lesion identified   TONSILLECTOMY      SOCIAL HISTORY:  Social History   Socioeconomic History   Marital status: Divorced    Spouse name: Not on file   Number of children: 5   Years of education: Not on file   Highest education level: Not on file  Occupational History   Occupation: unemployed  Tobacco Use   Smoking status: Former    Packs/day: 0.50    Years: 10.00    Pack years: 5.00    Types: Cigarettes    Quit date: 11/01/1981    Years since quitting: 39.5   Smokeless tobacco: Never   Tobacco comments:    quit 1999  Substance and Sexual Activity   Alcohol use: Yes    Comment: 1-2 drinks a month   Drug use: No   Sexual activity: Yes    Birth control/protection: None  Other Topics Concern   Not on file  Social History Narrative   Not on file   Social Determinants of Health   Financial Resource Strain: Not on file  Food Insecurity: Not on file  Transportation Needs: Not on file  Physical Activity: Not on file  Stress: Not on file  Social Connections: Not on file  Intimate Partner Violence: Not on file    FAMILY HISTORY:  Family History  Problem Relation Age of Onset   Cancer Mother    Cancer Maternal Uncle    Colon cancer Other        maternal great uncle   Lung cancer Other        uncles  CURRENT MEDICATIONS:  Current Outpatient Medications  Medication Sig Dispense Refill   aspirin EC 81 MG tablet Take 1 tablet (81 mg total) by mouth every other day. Resume in 1 week     hydrochlorothiazide (HYDRODIURIL) 25 MG tablet Take 25 mg by mouth daily. for high blood pressure     levothyroxine (SYNTHROID) 25 MCG tablet TAKE 1 TABLET BY MOUTH ONCE DAILY FOR LOW THYROID     loratadine (CLARITIN) 10 MG tablet Take 10 mg by mouth daily as needed. For allergies.     losartan (COZAAR) 100 MG tablet Take 100 mg by mouth daily. for high blood pressure     lovastatin (MEVACOR) 20 MG tablet Take 20 mg by mouth daily.     Multiple Vitamin (MULTIVITAMIN WITH  MINERALS) TABS Take 1 tablet by mouth every morning.     omeprazole (PRILOSEC) 20 MG capsule TAKE 1 CAPSULE BY MOUTH ONCE DAILY BEFORE  BREAKFAST 30 capsule 5   predniSONE (DELTASONE) 20 MG tablet Take 2 tablets (40 mg total) by mouth daily with breakfast. 10 tablet 0   No current facility-administered medications for this visit.    ALLERGIES:  No Known Allergies  PHYSICAL EXAM:  Performance status (ECOG): 1 - Symptomatic but completely ambulatory  There were no vitals filed for this visit. Wt Readings from Last 3 Encounters:  05/04/20 207 lb 12.8 oz (94.3 kg)  04/22/19 197 lb 11.2 oz (89.7 kg)  11/09/18 200 lb (90.7 kg)   Physical Exam Vitals reviewed.  Constitutional:      Appearance: Normal appearance.  Cardiovascular:     Rate and Rhythm: Normal rate and regular rhythm.     Pulses: Normal pulses.     Heart sounds: Normal heart sounds.  Pulmonary:     Effort: Pulmonary effort is normal.     Breath sounds: Normal breath sounds.  Abdominal:     Palpations: Abdomen is soft. There is no hepatomegaly, splenomegaly or mass.     Tenderness: There is no abdominal tenderness.     Hernia: A hernia is present.  Musculoskeletal:     Right lower leg: No edema.     Left lower leg: No edema.  Lymphadenopathy:     Lower Body: No right inguinal adenopathy. No left inguinal adenopathy.  Neurological:     General: No focal deficit present.     Mental Status: He is alert and oriented to person, place, and time.  Psychiatric:        Mood and Affect: Mood normal.        Behavior: Behavior normal.     LABORATORY DATA:  I have reviewed the labs as listed.  CBC Latest Ref Rng & Units 04/27/2021 04/27/2020 04/15/2019  WBC 4.0 - 10.5 K/uL 6.8 6.3 6.7  Hemoglobin 13.0 - 17.0 g/dL 13.2 13.2 12.8(L)  Hematocrit 39.0 - 52.0 % 40.7 39.7 40.0  Platelets 150 - 400 K/uL 172 186 190   CMP Latest Ref Rng & Units 04/27/2021 04/27/2020 04/15/2019  Glucose 70 - 99 mg/dL 128(H) 118(H) 98  BUN 8 - 23 mg/dL  13 16 14   Creatinine 0.61 - 1.24 mg/dL 0.99 0.92 0.91  Sodium 135 - 145 mmol/L 137 139 139  Potassium 3.5 - 5.1 mmol/L 3.5 3.7 3.5  Chloride 98 - 111 mmol/L 102 100 101  CO2 22 - 32 mmol/L 28 26 28   Calcium 8.9 - 10.3 mg/dL 9.3 9.3 9.3  Total Protein 6.5 - 8.1 g/dL 7.4 7.3 7.5  Total Bilirubin 0.3 -  1.2 mg/dL 0.6 0.6 0.9  Alkaline Phos 38 - 126 U/L 80 71 79  AST 15 - 41 U/L 34 31 31  ALT 0 - 44 U/L 34 37 33    DIAGNOSTIC IMAGING:  I have independently reviewed the scans and discussed with the patient. No results found.   ASSESSMENT:  1.  Low-grade carcinoid of the rectum: - Rectal polyp removed on 12/11/2012 shows low-grade, well-differentiated neuroendocrine tumor, 0.8 cm, tumor focally extending to cauterized polypectomy margin. -CT abdomen pelvis on 08/26/2013 showed no evidence of metastatic disease. -Colonoscopy on 03/15/2015 shows pancolonic diverticulosis.  Arterial bleeding arising from a cecal diverticulum status post hemostasis clip placement and injection therapy.  Site of prior rectal mucosa tattooing identified with no evidence of recurrent carcinoid tumor.    PLAN:  1.  Low-grade carcinoid of the rectum: - He does not report any signs or symptoms of carcinoid syndrome. - Physical examination did not reveal any palpable masses. - Reviewed labs from 04/27/2021 which showed normal LFTs.  Chromogranin level was 95.6. - Recommend follow-up in a year with labs.   Orders placed this encounter:  No orders of the defined types were placed in this encounter.    Derek Jack, MD [place] [phone]   I, Thana Ates, am acting as a scribe for Dr. Derek Jack.  I, Derek Jack MD, have reviewed the above documentation for accuracy and completeness, and I agree with the above.

## 2021-05-04 ENCOUNTER — Other Ambulatory Visit: Payer: Self-pay

## 2021-05-04 ENCOUNTER — Inpatient Hospital Stay (HOSPITAL_BASED_OUTPATIENT_CLINIC_OR_DEPARTMENT_OTHER): Payer: Medicare HMO | Admitting: Hematology

## 2021-05-04 VITALS — BP 142/66 | HR 66 | Temp 98.0°F | Resp 16 | Wt 209.4 lb

## 2021-05-04 DIAGNOSIS — C7A026 Malignant carcinoid tumor of the rectum: Secondary | ICD-10-CM | POA: Diagnosis not present

## 2021-05-04 DIAGNOSIS — Z85040 Personal history of malignant carcinoid tumor of rectum: Secondary | ICD-10-CM | POA: Diagnosis not present

## 2021-05-04 NOTE — Patient Instructions (Addendum)
[facility] at [facility] Discharge Instructions  You were seen today by Dr. Katragadda. He went over your recent results. Dr. Katragadda will see you back in 1 year for labs and follow up.   Thank you for choosing [facility] at [facility] to provide your oncology and hematology care.  To afford each patient quality time with our provider, please arrive at least 15 minutes before your scheduled appointment time.   If you have a lab appointment with the Cancer Center please come in thru the Main Entrance and check in at the main information desk  You need to re-schedule your appointment should you arrive 10 or more minutes late.  We strive to give you quality time with our providers, and arriving late affects you and other patients whose appointments are after yours.  Also, if you no show three or more times for appointments you may be dismissed from the clinic at the providers discretion.     Again, thank you for choosing [facility].  Our hope is that these requests will decrease the amount of time that you wait before being seen by our physicians.       _____________________________________________________________  Should you have questions after your visit to  Cancer Center, please contact our office at [phone] between the hours of 8:00 a.m. and 4:30 p.m.  Voicemails left after 4:00 p.m. will not be returned until the following business day.  For prescription refill requests, have your pharmacy contact our office and allow 72 hours.    Cancer Center Support Programs:   > Cancer Support Group  2nd Tuesday of the month 1pm-2pm, Journey Room    

## 2021-07-21 ENCOUNTER — Emergency Department (HOSPITAL_COMMUNITY)
Admission: EM | Admit: 2021-07-21 | Discharge: 2021-07-22 | Disposition: A | Discharge status: Home / Self Care | Payer: Medicare HMO | Attending: Emergency Medicine | Admitting: Emergency Medicine

## 2021-07-21 ENCOUNTER — Encounter (HOSPITAL_COMMUNITY): Payer: Self-pay

## 2021-07-21 ENCOUNTER — Other Ambulatory Visit: Payer: Self-pay

## 2021-07-21 DIAGNOSIS — Z87891 Personal history of nicotine dependence: Secondary | ICD-10-CM | POA: Insufficient documentation

## 2021-07-21 DIAGNOSIS — M5442 Lumbago with sciatica, left side: Secondary | ICD-10-CM | POA: Diagnosis not present

## 2021-07-21 DIAGNOSIS — M5432 Sciatica, left side: Secondary | ICD-10-CM

## 2021-07-21 DIAGNOSIS — Z7982 Long term (current) use of aspirin: Secondary | ICD-10-CM | POA: Diagnosis not present

## 2021-07-21 DIAGNOSIS — Z79899 Other long term (current) drug therapy: Secondary | ICD-10-CM | POA: Insufficient documentation

## 2021-07-21 DIAGNOSIS — M545 Low back pain, unspecified: Secondary | ICD-10-CM | POA: Diagnosis present

## 2021-07-21 NOTE — ED Triage Notes (Signed)
Pt reports left hip and knee pain that started this afternoon after mowing. Pt denies injury.

## 2021-07-22 MED ORDER — HYDROCODONE-ACETAMINOPHEN 5-325 MG PO TABS: 2.0000 | ORAL_TABLET | ORAL | 0 refills | Status: DC | PRN

## 2021-07-22 MED ORDER — HYDROCODONE-ACETAMINOPHEN 5-325 MG PO TABS: 1.0000 | ORAL_TABLET | Freq: Four times a day (QID) | ORAL | 0 refills | Status: DC | PRN

## 2021-07-22 MED ORDER — PREDNISONE 10 MG PO TABS: 20.0000 mg | ORAL_TABLET | Freq: Two times a day (BID) | ORAL | 0 refills | Status: DC

## 2021-07-22 NOTE — Discharge Instructions (Addendum)
Begin taking prednisone as prescribed.  Begin taking hydrocodone as prescribed as needed for pain.  Follow-up with your primary doctor if symptoms are not improving in the next week. 

## 2021-07-22 NOTE — ED Provider Notes (Signed)
[facility] EMERGENCY DEPARTMENT Provider Note   CSN: [id number] Arrival date & time: 07/21/21  2201     History Chief Complaint  Patient presents with   Leg Pain    Left hip and knee    Tim Cunningham is a 69 y.o. male.  Patient is a 70 year old male with a past medical history of GERD, hyperlipidemia.  Patient presenting today for evaluation of left hip and leg pain.  This started today after mowing grass.  He describes pain in his left buttock that radiates into his left leg.  He denies any weakness or numbness.  He denies any bowel or bladder complaints.  The history is provided by the patient.  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   GERD (gastroesophageal reflux disease)    Hyperlipidemia    Lower GI bleeding APR 2016 TCS RMR   DUE TO CECAL DIVERTICULUM REQUIRED CLIPS/EPI    Patient Active Problem List   Diagnosis Date Noted   Healthcare maintenance 10/19/2016   GERD (gastroesophageal reflux disease) 03/30/2016   Diverticulosis of colon with hemorrhage    Hyperlipidemia    Acute blood loss anemia    Lower GI bleeding DUE TO DIVERTICULUM 03/14/2015   GIB (gastrointestinal bleeding) 03/14/2015   Colon cancer screening 01/23/2013   Carcinoid tumor of rectum [id number]   Umbilical hernia without obstruction or gangrene 10/15/2012   Heme positive stool 10/15/2012    Past Surgical History:  Procedure Laterality Date   COLONOSCOPY  11/01/2012   Dr. Oneida Alar: SML IH, RECTAL CARCINOID, HYPEPRPLASTIC POLYPS(2), Moderate TICS/Small lipoma IN HF   COLONOSCOPY N/A 03/15/2015   Dr. Gala Romney: Pancolonic diverticulosis. Arterial bleeding arising from a cecal diverticulum status post  hemostasis clip placement and injection therapy . Stie of prior rectal mucosal tattooing identified with no evidence of recurrent carcinoid tumor. No EGD done today,.   ESOPHAGOGASTRODUODENOSCOPY  11/01/2012   Dr. Fields:Schatzki ring was found/MILD GASTRITIS/DUODENIIS   EUS  12/11/2012   RECTAL CARCINOID EXCISED-MARGINS NOT CLEAR   FLEXIBLE SIGMOIDOSCOPY  Jan 2016   WITH EUS. Dr. Paulita Fujita. NO residual lesion identified   TONSILLECTOMY         Family History  Problem Relation Age of Onset   Cancer Mother    Cancer Maternal Uncle    Colon cancer Other        maternal great uncle   Lung cancer Other        uncles    Social History   Tobacco Use   Smoking status: Former    Packs/day: 0.50    Years: 10.00    Pack years: 5.00    Types: Cigarettes    Quit date: 11/01/1981    Years since quitting: 39.7   Smokeless tobacco: Never   Tobacco comments:    quit 1999  Substance Use Topics   Alcohol use: Yes    Comment: 1-2 drinks a month   Drug use: No    Home Medications Prior to Admission medications   Medication Sig Start Date End Date Taking? Authorizing Provider  aspirin EC 81 MG tablet Take 1 tablet (81 mg total) by mouth every other day. Resume in  1 week 03/16/15   Kathie Dike, MD  hydrochlorothiazide (HYDRODIURIL) 25 MG tablet Take 25 mg by mouth daily. for high blood pressure 01/17/19   [provider]  levothyroxine (SYNTHROID) 25 MCG tablet TAKE 1 TABLET BY MOUTH ONCE DAILY FOR LOW THYROID 11/20/18   [provider]  loratadine (CLARITIN) 10 MG tablet Take 10 mg by mouth daily as needed. For allergies.    [provider]  losartan (COZAAR) 100 MG tablet Take 100 mg by mouth daily. for high blood pressure 03/24/19   [provider]  lovastatin (MEVACOR) 20 MG tablet Take 20 mg by mouth daily. 09/21/16   [provider]  lovastatin (MEVACOR) 40 MG tablet Take 40 mg by mouth daily. 04/05/21   [provider]  Multiple  Vitamin (MULTIVITAMIN WITH MINERALS) TABS Take 1 tablet by mouth every morning.    [provider]  omeprazole (PRILOSEC) 20 MG capsule TAKE 1 CAPSULE BY MOUTH ONCE DAILY BEFORE  BREAKFAST 05/09/18   Annitta Needs, NP  predniSONE (DELTASONE) 20 MG tablet Take 2 tablets (40 mg total) by mouth daily with breakfast. 01/30/18   Orpah Greek, MD  sildenafil (REVATIO) 20 MG tablet SMARTSIG:2 Tablet(s) By Mouth 04/27/21   [provider]    Allergies    Patient has no known allergies.  Review of Systems   Review of Systems  All other systems reviewed and are negative.  Physical Exam Updated Vital Signs BP (!) 155/74 (BP Location: Right Arm)   Pulse 61   Temp 98 F (36.7 C) (Oral)   Resp 17   Ht '5\' 7"'$  (1.702 m)   Wt 95 kg   SpO2 96%   BMI 32.80 kg/m   Physical Exam Vitals and nursing note reviewed.  Constitutional:      General: He is not in acute distress.    Appearance: Normal appearance. He is not ill-appearing.  HENT:     Head: Normocephalic and atraumatic.  Pulmonary:     Effort: Pulmonary effort is normal.  Abdominal:     General: There is no distension.     Palpations: There is no mass.     Tenderness: There is no abdominal tenderness.  Musculoskeletal:     Comments: There is tenderness to palpation in the left buttock.  Skin:    General: Skin is warm and dry.  Neurological:     Mental Status: He is alert and oriented to person, place, and time.     Comments: DTRs are trace and symmetrical in the patellar and Achilles tendons bilaterally.  Strength is 5 out of 5 in both lower extremities.  Patient is able to ambulate.    ED Results / Procedures / Treatments   Labs (all labs ordered are listed, but only abnormal results are displayed) Labs Reviewed - No data to display  EKG None  Radiology No results found.  Procedures Procedures   Medications Ordered in ED Medications - No data to display  ED Course  I have reviewed the triage  vital signs and the nursing notes.  Pertinent labs & imaging results that were available during my care of the patient were reviewed by me and considered in my medical decision making (see chart for details).    MDM Rules/Calculators/A&P  Patient presenting with complaints of pain in his left buttock radiating into his left knee.  Symptoms are consistent with sciatica.  Patient will be treated with prednisone, hydrocodone, and follow-up as needed if not improving.  There are no red flags that would suggest an emergent situation.  Final Clinical Impression(s) / ED Diagnoses Final diagnoses:  None    Rx / DC Orders ED Discharge Orders     None        Veryl Speak, MD 07/22/21 [phone]

## 2021-07-23 MED FILL — Hydrocodone-Acetaminophen Tab 5-325 MG: ORAL | Qty: 6 | Status: AC

## 2021-07-29 ENCOUNTER — Other Ambulatory Visit (HOSPITAL_COMMUNITY): Payer: Self-pay | Admitting: Family Medicine

## 2021-07-29 ENCOUNTER — Ambulatory Visit (HOSPITAL_COMMUNITY)
Admission: RE | Admit: 2021-07-29 | Discharge: 2021-07-29 | Disposition: A | Discharge status: Home / Self Care | Payer: Medicare HMO | Source: Ambulatory Visit | Attending: Family Medicine | Admitting: Family Medicine

## 2021-07-29 ENCOUNTER — Other Ambulatory Visit: Payer: Self-pay

## 2021-07-29 DIAGNOSIS — M5432 Sciatica, left side: Secondary | ICD-10-CM | POA: Insufficient documentation

## 2022-05-04 ENCOUNTER — Other Ambulatory Visit (HOSPITAL_COMMUNITY): Payer: Medicare HMO

## 2022-05-08 ENCOUNTER — Inpatient Hospital Stay (HOSPITAL_COMMUNITY): Payer: Medicare HMO | Attending: Hematology

## 2022-05-08 DIAGNOSIS — C7A026 Malignant carcinoid tumor of the rectum: Secondary | ICD-10-CM

## 2022-05-08 DIAGNOSIS — Z87891 Personal history of nicotine dependence: Secondary | ICD-10-CM | POA: Diagnosis not present

## 2022-05-08 DIAGNOSIS — Z85040 Personal history of malignant carcinoid tumor of rectum: Secondary | ICD-10-CM | POA: Diagnosis not present

## 2022-05-08 DIAGNOSIS — Z800 Family history of malignant neoplasm of digestive organs: Secondary | ICD-10-CM | POA: Diagnosis not present

## 2022-05-08 DIAGNOSIS — Z801 Family history of malignant neoplasm of trachea, bronchus and lung: Secondary | ICD-10-CM | POA: Insufficient documentation

## 2022-05-08 LAB — CBC WITH DIFFERENTIAL/PLATELET
Abs Immature Granulocytes: 0.02 10*3/uL (ref 0.00–0.07)
Basophils Absolute: 0.1 10*3/uL (ref 0.0–0.1)
Basophils Relative: 1 %
Eosinophils Absolute: 0.3 10*3/uL (ref 0.0–0.5)
Eosinophils Relative: 4 %
HCT: 38.6 % — ABNORMAL LOW (ref 39.0–52.0)
Hemoglobin: 12.8 g/dL — ABNORMAL LOW (ref 13.0–17.0)
Immature Granulocytes: 0 %
Lymphocytes Relative: 25 %
Lymphs Abs: 1.7 10*3/uL (ref 0.7–4.0)
MCH: 29.6 pg (ref 26.0–34.0)
MCHC: 33.2 g/dL (ref 30.0–36.0)
MCV: 89.1 fL (ref 80.0–100.0)
Monocytes Absolute: 0.6 10*3/uL (ref 0.1–1.0)
Monocytes Relative: 8 %
Neutro Abs: 4.2 10*3/uL (ref 1.7–7.7)
Neutrophils Relative %: 62 %
Platelets: 182 10*3/uL (ref 150–400)
RBC: 4.33 MIL/uL (ref 4.22–5.81)
RDW: 12.6 % (ref 11.5–15.5)
WBC: 6.9 10*3/uL (ref 4.0–10.5)
nRBC: 0 % (ref 0.0–0.2)

## 2022-05-08 LAB — COMPREHENSIVE METABOLIC PANEL
ALT: 27 U/L (ref 0–44)
AST: 29 U/L (ref 15–41)
Albumin: 4.2 g/dL (ref 3.5–5.0)
Alkaline Phosphatase: 78 U/L (ref 38–126)
Anion gap: 7 (ref 5–15)
BUN: 14 mg/dL (ref 8–23)
CO2: 27 mmol/L (ref 22–32)
Calcium: 9.1 mg/dL (ref 8.9–10.3)
Chloride: 104 mmol/L (ref 98–111)
Creatinine, Ser: 0.9 mg/dL (ref 0.61–1.24)
GFR, Estimated: >60 mL/min (ref >60)
Glucose, Bld: 113 mg/dL — ABNORMAL HIGH (ref 70–99)
Potassium: 3.6 mmol/L (ref 3.5–5.1)
Sodium: 138 mmol/L (ref 135–145)
Total Bilirubin: 0.4 mg/dL (ref 0.3–1.2)
Total Protein: 7.2 g/dL (ref 6.5–8.1)

## 2022-05-09 LAB — CHROMOGRANIN A: Chromogranin A (ng/mL): 91.7 ng/mL (ref 0.0–101.8)

## 2022-05-11 ENCOUNTER — Inpatient Hospital Stay (HOSPITAL_BASED_OUTPATIENT_CLINIC_OR_DEPARTMENT_OTHER): Payer: Medicare HMO | Admitting: Hematology

## 2022-05-11 ENCOUNTER — Encounter (HOSPITAL_COMMUNITY): Payer: Self-pay | Admitting: Hematology

## 2022-05-11 VITALS — BP 170/75 | HR 64 | Temp 97.5°F | Resp 16 | Ht 66.0 in | Wt 207.4 lb

## 2022-05-11 DIAGNOSIS — Z85040 Personal history of malignant carcinoid tumor of rectum: Secondary | ICD-10-CM | POA: Diagnosis not present

## 2022-05-11 DIAGNOSIS — C7A026 Malignant carcinoid tumor of the rectum: Secondary | ICD-10-CM | POA: Diagnosis not present

## 2022-05-11 NOTE — Patient Instructions (Signed)
[place] at [facility] Discharge Instructions  You were seen and examined today by Dr. Delton Coombes.  Dr. Delton Coombes discussed your most recent lab work which is stable. Your primary care can continue care moving forward.  Follow-up only if needed.  Thank you for choosing [place] at [facility] to provide your oncology and hematology care.  To afford each patient quality time with our provider, please arrive at least 15 minutes before your scheduled appointment time.   If you have a lab appointment with the [facility] please come in thru the Main Entrance and check in at the main information desk.  You need to re-schedule your appointment should you arrive 10 or more minutes late.  We strive to give you quality time with our providers, and arriving late affects you and other patients whose appointments are after yours.  Also, if you no show three or more times for appointments you may be dismissed from the clinic at the providers discretion.     Again, thank you for choosing [facility].  Our hope is that these requests will decrease the amount of time that you wait before being seen by our physicians.       _____________________________________________________________  Should you have questions after your visit to [facility], please contact our office at [phone] and follow the prompts.  Our office hours are 8:00 a.m. and 4:30 p.m. Monday - Friday.  Please note that voicemails left after 4:00 p.m. may not be returned until the following business day.  We are closed weekends and major holidays.  You do have access to a nurse 24-7, just call the main number to the clinic [phone] and do not press any options, hold on the line and a nurse will answer the phone.    For prescription refill requests, have your pharmacy contact our office and allow 72 hours.

## 2022-07-07 IMAGING — MR MR LUMBAR SPINE W/O CM
4 of 5 series · 30 of 48 positions shown · non-contrast
Comparison: Radiographs 07/24/2021.

CLINICAL DATA: Left sided sciatica NEB.MH ([id number])

Lower back pain, radiates down left leg for 1.5 weeks.
EXAM:
MRI LUMBAR SPINE WITHOUT CONTRAST
TECHNIQUE: Multiplanar, multisequence MR imaging of the lumbar spine was
performed. No intravenous contrast was administered.

[Series 5: T2 · sagittal · 4.0mm · 0.68mm/px · 7 of 15 slices shown (1 of 2)]
[im 1/15]
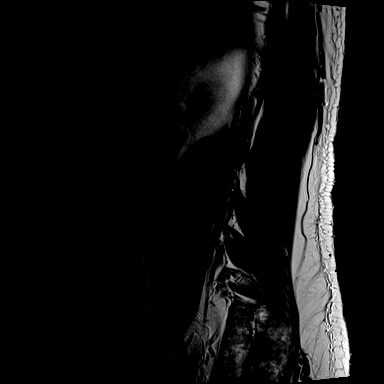
[im 3/15]
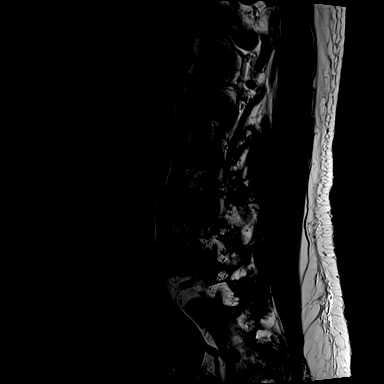
[im 5/15]
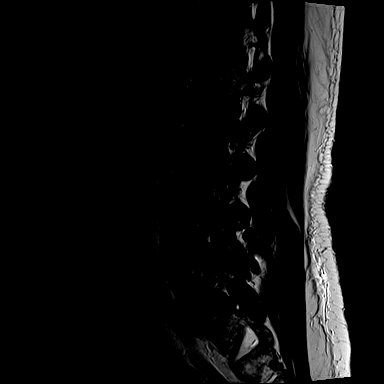
[im 8/15]
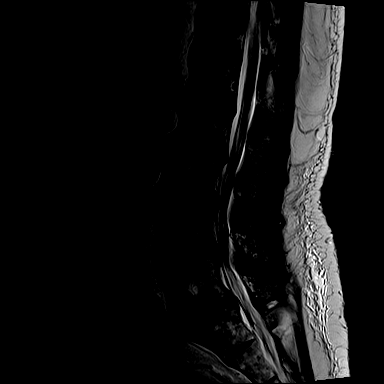
[im 10/15]
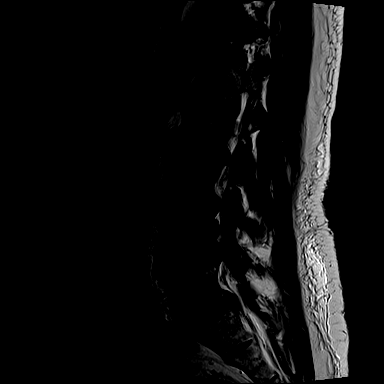
[im 12/15]
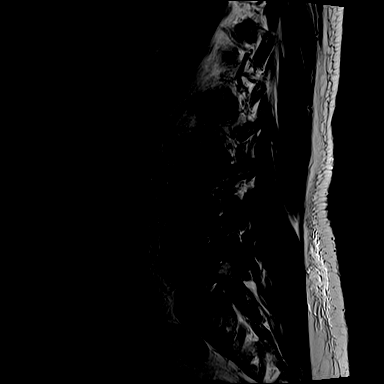
[im 15/15]
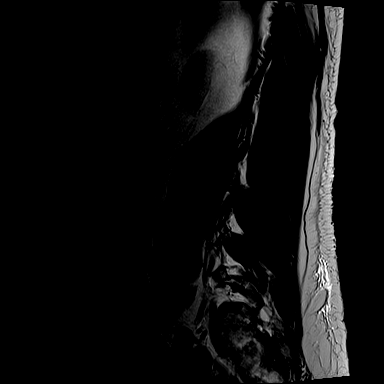

[Series 6: T1 · sagittal · 4.0mm · 0.81mm/px · 7 of 15 slices shown (1 of 2)]
[im 1/15]
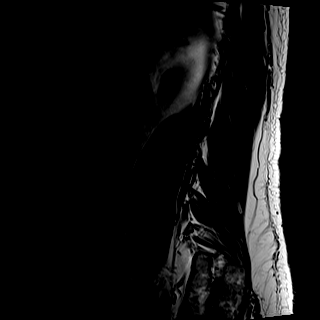
[im 3/15]
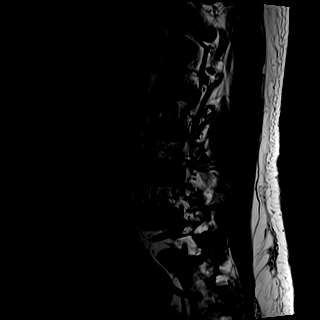
[im 5/15]
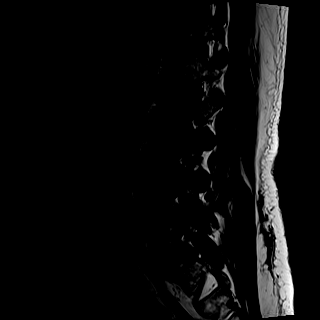
[im 8/15]
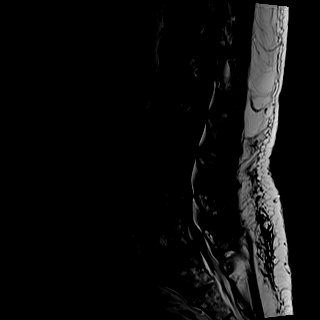
[im 10/15]
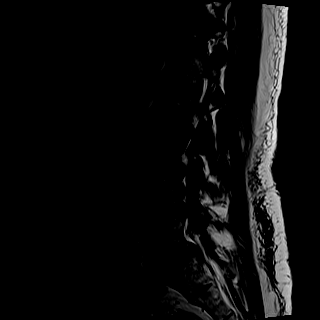
[im 12/15]
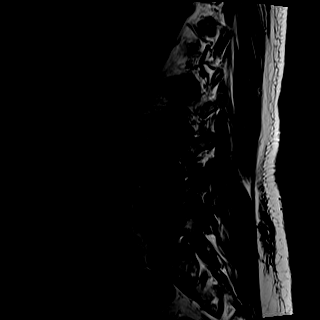
[im 15/15]
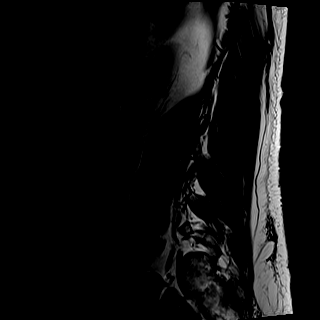

[Series 8: T2 · axial · 4.0mm · 0.70mm/px · z∈[-116,+79]mm · 8 of 34 slices shown (2 of 2)]
[im 1/34]
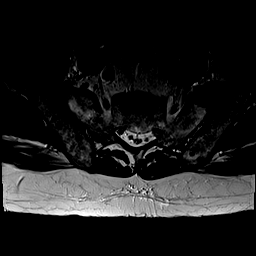
[im 6/34]
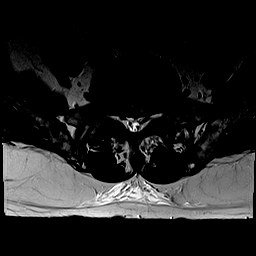
[im 11/34]
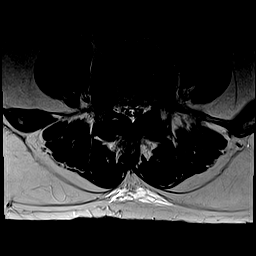
[im 16/34]
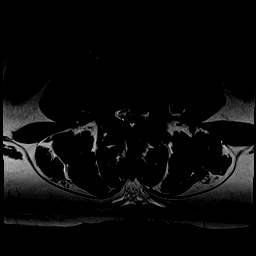
[im 18/34]
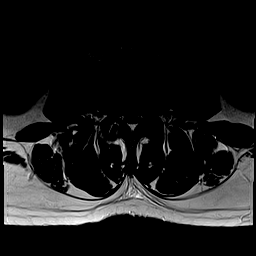
[im 23/34]
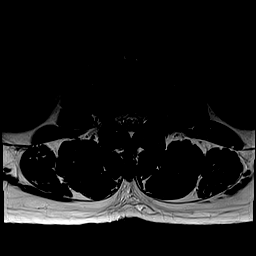
[im 28/34]
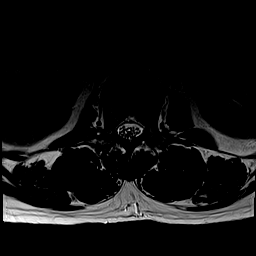
[im 34/34]
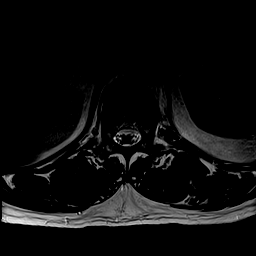

[Series 9: T1 · axial · 4.0mm · 0.35mm/px · z∈[-116,+79]mm · 8 of 34 slices shown (2 of 2)]
[im 1/34]
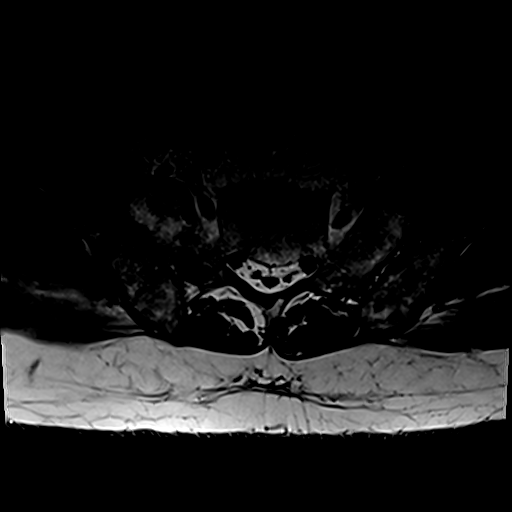
[im 6/34]
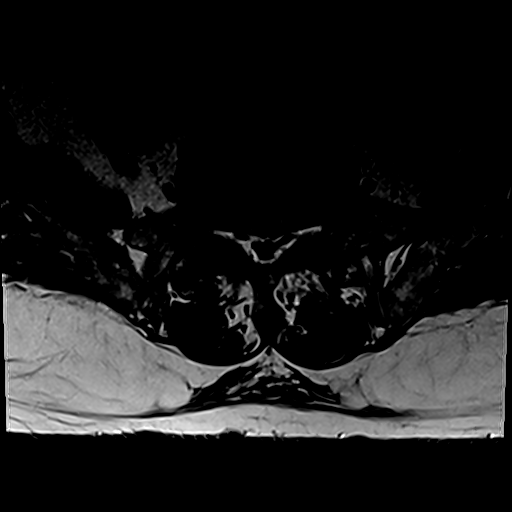
[im 11/34]
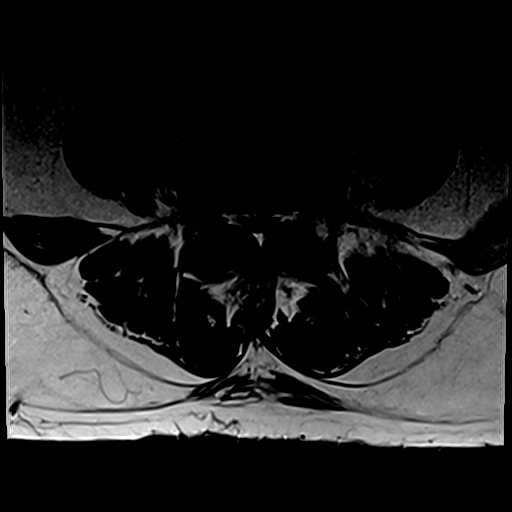
[im 16/34]
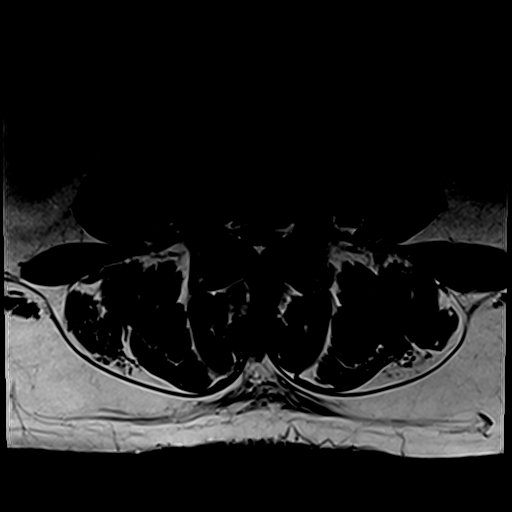
[im 18/34]
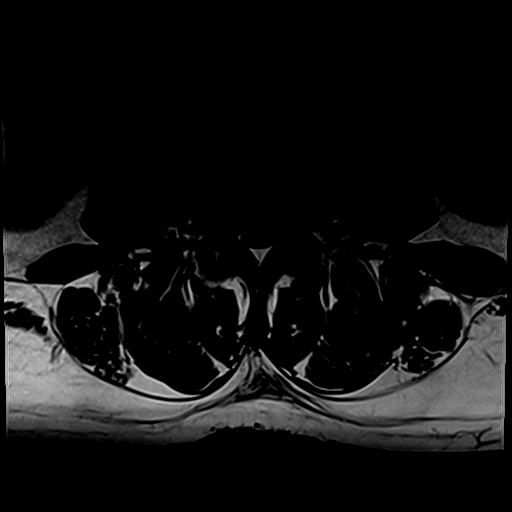
[im 23/34]
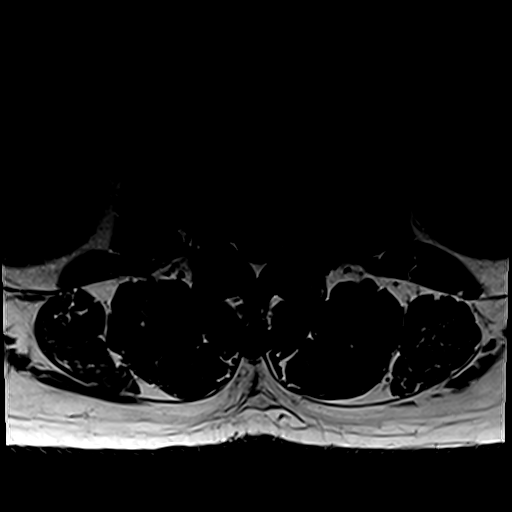
[im 28/34]
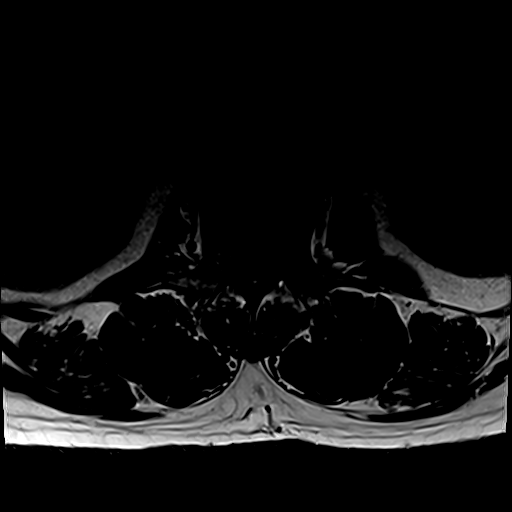
[im 34/34]
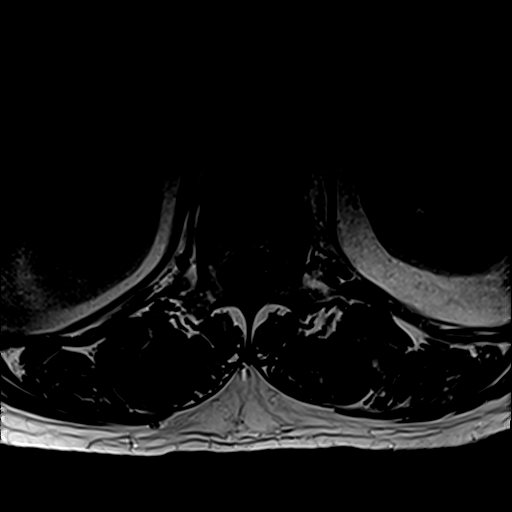

[30 of 48 positions shown; findings below may reference images not displayed]

FINDINGS: Segmentation: Standard segmentation is assumed with the inferior
most fully formed intervertebral disc is labeled L5-S1.

Alignment: Slight retrolisthesis of L2 on L3. Otherwise, no
substantial sagittal subluxation.

Vertebrae: Vertebral body heights are maintained. No focal marrow
edema to suggest acute fracture or discitis/osteomyelitis. Mildly T1
hypointense and STIR hyperintense T12 vertebral body and posterior
elements. Otherwise, heterogeneous bone marrow without suspicious
bone lesion.

Conus medullaris and cauda equina: Conus extends to the L2 level.
Conus appears normal.

Paraspinal and other soft tissues: Right renal cyst.

Disc levels:

T12-L1: Only imaged sagittally without significant canal or
foraminal stenosis.

L1-L2: Mild disc bulging without significant canal or foraminal
stenosis.

L2-L3: Slight retrolisthesis of L2 on L3. Broad disc bulge with
moderate bilateral facet hypertrophy. Resulting mild to moderate
bilateral foraminal stenosis and mild-to-moderate canal stenosis.

L3-L4: Broad disc bulge with moderate bilateral facet hypertrophy
and ligamentum flavum thickening. Mildly prominent dorsal epidural
fat. Resulting moderate bilateral foraminal stenosis and moderate
canal stenosis.

L4-L5: Broad disc bulge with moderate bilateral facet hypertrophy
and ligamentum flavum thickening. Superimposed superiorly dissecting
left subarticular disc protrusion which extends along the posterior
L4 vertebral body. Prominent dorsal epidural fat. Resulting in left
subarticular recess stenosis with potential for impingement of
descending left L5 and exiting left L4 nerve roots. Moderate to
severe left foraminal stenosis. Mild right foraminal stenosis.

L5-S1: Broad disc bulge with moderate bilateral facet hypertrophy.
Mild central canal stenosis with moderate left subarticular recess
stenosis. Mild-to-moderate bilateral foraminal stenosis.
IMPRESSION: 1. At L4-L5, superiorly dissecting left subarticular disc protrusion
with left subarticular recess stenosis and potential impingement of
the descending left L5 and exiting left L4 nerve roots and moderate
to severe left foraminal stenosis. Moderate to severe canal
stenosis.
2. At L3-L4, moderate canal stenosis and bilateral foraminal
stenosis.
3. At L2-L3, mild-to-moderate canal stenosis and bilateral foraminal
stenosis.
4. At L5-S1, mild-to-moderate bilateral foraminal stenosis and mild
canal stenosis.
5. Mildly T1 hypointense and STIR hyperintense T12 vertebral body
and posterior elements, nonspecific but likely chronic (and
therefore benign) given diffuse sclerosis was apparent on CT
abdomen/pelvis from 08/26/2013.

## 2022-10-21 ENCOUNTER — Emergency Department (HOSPITAL_COMMUNITY)
Admission: EM | Admit: 2022-10-21 | Discharge: 2022-10-21 | Disposition: A | Discharge status: Home / Self Care | Payer: Medicare HMO | Attending: Emergency Medicine | Admitting: Emergency Medicine

## 2022-10-21 ENCOUNTER — Encounter (HOSPITAL_COMMUNITY): Payer: Self-pay

## 2022-10-21 ENCOUNTER — Other Ambulatory Visit: Payer: Self-pay

## 2022-10-21 DIAGNOSIS — Z87891 Personal history of nicotine dependence: Secondary | ICD-10-CM | POA: Insufficient documentation

## 2022-10-21 DIAGNOSIS — I10 Essential (primary) hypertension: Secondary | ICD-10-CM | POA: Diagnosis present

## 2022-10-21 DIAGNOSIS — I159 Secondary hypertension, unspecified: Secondary | ICD-10-CM | POA: Diagnosis not present

## 2022-10-21 LAB — CBC
HCT: 27.6 % — ABNORMAL LOW (ref 39.0–52.0)
Hemoglobin: 8.9 g/dL — ABNORMAL LOW (ref 13.0–17.0)
MCH: 28.9 pg (ref 26.0–34.0)
MCHC: 32.2 g/dL (ref 30.0–36.0)
MCV: 89.6 fL (ref 80.0–100.0)
Platelets: 196 10*3/uL (ref 150–400)
RBC: 3.08 MIL/uL — ABNORMAL LOW (ref 4.22–5.81)
RDW: 13.2 % (ref 11.5–15.5)
WBC: 12.2 10*3/uL — ABNORMAL HIGH (ref 4.0–10.5)
nRBC: 0 % (ref 0.0–0.2)

## 2022-10-21 LAB — BASIC METABOLIC PANEL
Anion gap: 9 (ref 5–15)
BUN: 16 mg/dL (ref 8–23)
CO2: 25 mmol/L (ref 22–32)
Calcium: 8.5 mg/dL — ABNORMAL LOW (ref 8.9–10.3)
Chloride: 103 mmol/L (ref 98–111)
Creatinine, Ser: 1.1 mg/dL (ref 0.61–1.24)
GFR, Estimated: >60 mL/min (ref >60)
Glucose, Bld: 198 mg/dL — ABNORMAL HIGH (ref 70–99)
Potassium: 3.1 mmol/L — ABNORMAL LOW (ref 3.5–5.1)
Sodium: 137 mmol/L (ref 135–145)

## 2022-10-21 NOTE — ED Provider Notes (Signed)
[facility] Emergency Department Provider Note MRN:  [id number]  Arrival date & time: 10/21/22     Chief Complaint   Hypertension   History of Present Illness   Tim Cunningham is a 71 y.o. year-old male with a history of hypertension presenting to the ED with chief complaint of hypertension.  Patient notices diastolic blood pressure was 90-100 this evening and so here for evaluation.  No headache or vision change, no chest pain or shortness of breath, no abdominal pain, no complaints.  Higher than normal.  Noted that his stool was a reddish color this evening.  But then he remembered that he had red velvet cake.  Review of Systems  A thorough review of systems was obtained and all systems are negative except as noted in the HPI and PMH.   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 Oneida Alar: SML IH, RECTAL CARCINOID, HYPEPRPLASTIC POLYPS(2), Moderate TICS/Small lipoma IN HF   COLONOSCOPY N/A 03/15/2015   Dr. Gala Romney: Pancolonic diverticulosis. Arterial bleeding arising from a cecal diverticulum status post hemostasis clip placement and injection therapy . Stie of prior rectal mucosal tattooing identified with no evidence of recurrent carcinoid tumor. No EGD done today,.   ESOPHAGOGASTRODUODENOSCOPY  11/01/2012   Dr. Fields:Schatzki ring was found/MILD GASTRITIS/DUODENIIS   EUS  12/11/2012   RECTAL CARCINOID EXCISED-MARGINS NOT CLEAR   FLEXIBLE SIGMOIDOSCOPY  Jan 2016   WITH EUS. Dr. Paulita Fujita. NO residual lesion identified   TONSILLECTOMY      Family History  Problem Relation Age of Onset   Cancer Mother    Cancer Maternal Uncle    Colon cancer Other        maternal  great uncle   Lung cancer Other        uncles    Social History   Socioeconomic History   Marital status: Divorced    Spouse name: Not on file   Number of children: 5   Years of education: Not on file   Highest education level: Not on file  Occupational History   Occupation: unemployed  Tobacco Use   Smoking status: Former    Packs/day: 0.50    Years: 10.00    Total pack years: 5.00    Types: Cigarettes    Quit date: 11/01/1981    Years since quitting: 40.9   Smokeless tobacco: Never   Tobacco comments:    quit 1999  Substance and Sexual Activity   Alcohol use: Yes    Comment: 1-2 drinks a month   Drug use: No   Sexual activity: Yes    Birth control/protection: None  Other Topics Concern   Not on file  Social History Narrative   Not on file   Social Determinants of Health   Financial Resource Strain: Not on file  Food Insecurity: Not on file  Transportation Needs: Not on file  Physical Activity: Not on file  Stress: Not on file  Social Connections: Not on file  Intimate Partner Violence: Not on file     Physical Exam   Vitals:   10/21/22 2217 10/21/22 2300  BP: (!) 155/73 122/70  Pulse: (!) 105 96  Resp: 18 18  Temp:    SpO2: 96% 98%    CONSTITUTIONAL: Well-appearing, NAD NEURO/PSYCH:  Alert and oriented x 3, no focal deficits EYES:  eyes equal and reactive ENT/NECK:  no LAD, no JVD CARDIO: Regular rate, well-perfused, normal S1 and S2 PULM:  CTAB no wheezing or rhonchi GI/GU:  non-distended, non-tender MSK/SPINE:  No gross deformities, no edema SKIN:  no rash, atraumatic   *Additional and/or pertinent findings included in MDM below  Diagnostic and Interventional Summary    EKG Interpretation  Date/Time:    Ventricular Rate:    PR Interval:    QRS Duration:   QT Interval:    QTC Calculation:   R Axis:     Text Interpretation:         Labs Reviewed  CBC - Abnormal; Notable for the following components:      Result Value   WBC  12.2 (*)    RBC 3.08 (*)    Hemoglobin 8.9 (*)    HCT 27.6 (*)    All other components within normal limits  BASIC METABOLIC PANEL - Abnormal; Notable for the following components:   Potassium 3.1 (*)    Glucose, Bld 198 (*)    Calcium 8.5 (*)    All other components within normal limits    No orders to display    Medications - No data to display   Procedures  /  Critical Care Procedures  ED Course and Medical Decision Making  Initial Impression and Ddx Asymptomatic and hypertension, provided reassurance given his extremely well-appearing nature, no symptoms, normal vital signs, blood pressure normal here in the emergency department.  Doubt significant GI bleeding based on his report of red stool.  His H&H is a bit lower compared to levels in June, recommended close monitoring and PCP follow-up.  No emergent process.  Past medical/surgical history that increases complexity of ED encounter: Hypertension  Interpretation of Diagnostics See above  Patient Reassessment and Ultimate Disposition/Management     Discharge  Patient management required discussion with the following services or consulting groups:  None  Complexity of Problems Addressed Acute illness or injury that poses threat of life of bodily function  Additional Data Reviewed and Analyzed Further history obtained from: None  Additional Factors Impacting ED Encounter Risk None  Barth Kirks. Sedonia Small, MD [place] [email or web address]  Final Clinical Impressions(s) / ED Diagnoses     ICD-10-CM   1. 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  Your exam/testing  today was overall reassuring.  Recommend continued follow-up with your primary care doctor to discuss your blood pressure.  Also recommend monitoring your stool for any continued signs of redness or blood.  Please return to the Emergency Department if you experience any worsening of your condition.  Thank you for allowing [place] to be a part of your care.       Maudie Flakes, MD 10/21/22 [phone]

## 2022-10-21 NOTE — ED Triage Notes (Signed)
Pt presents with elevated B/P and states his diastolic was "356" at home. Pt denies any CP, ShOB, vision changes, or HA. Pt also reports he noted some bright red blood on the toilet paper after having a BM.

## 2022-10-21 NOTE — Discharge Instructions (Signed)
You were evaluated in the Emergency Department and after careful evaluation, we did not find any emergent condition requiring admission or further testing in the hospital.  Your exam/testing today was overall reassuring.  Recommend continued follow-up with your primary care doctor to discuss your blood pressure.  Also recommend monitoring your stool for any continued signs of redness or blood.  Please return to the Emergency Department if you experience any worsening of your condition.  Thank you for allowing [place] to be a part of your care.

## 2022-10-24 ENCOUNTER — Emergency Department (HOSPITAL_COMMUNITY): Payer: Medicare HMO

## 2022-10-24 ENCOUNTER — Encounter (HOSPITAL_COMMUNITY): Payer: Self-pay | Admitting: Emergency Medicine

## 2022-10-24 ENCOUNTER — Observation Stay (HOSPITAL_COMMUNITY)
Admission: EM | Admit: 2022-10-24 | Discharge: 2022-10-27 | Disposition: A | Discharge status: Home / Self Care | Payer: Medicare HMO | Attending: Internal Medicine | Admitting: Internal Medicine

## 2022-10-24 ENCOUNTER — Other Ambulatory Visit: Payer: Self-pay

## 2022-10-24 DIAGNOSIS — K922 Gastrointestinal hemorrhage, unspecified: Secondary | ICD-10-CM | POA: Diagnosis not present

## 2022-10-24 DIAGNOSIS — I10 Essential (primary) hypertension: Secondary | ICD-10-CM | POA: Diagnosis not present

## 2022-10-24 DIAGNOSIS — E876 Hypokalemia: Secondary | ICD-10-CM | POA: Insufficient documentation

## 2022-10-24 DIAGNOSIS — Z87891 Personal history of nicotine dependence: Secondary | ICD-10-CM | POA: Insufficient documentation

## 2022-10-24 DIAGNOSIS — Z86010 Personal history of colonic polyps: Secondary | ICD-10-CM

## 2022-10-24 DIAGNOSIS — K625 Hemorrhage of anus and rectum: Secondary | ICD-10-CM | POA: Insufficient documentation

## 2022-10-24 DIAGNOSIS — D500 Iron deficiency anemia secondary to blood loss (chronic): Secondary | ICD-10-CM | POA: Insufficient documentation

## 2022-10-24 DIAGNOSIS — Z79899 Other long term (current) drug therapy: Secondary | ICD-10-CM | POA: Diagnosis not present

## 2022-10-24 DIAGNOSIS — K5730 Diverticulosis of large intestine without perforation or abscess without bleeding: Secondary | ICD-10-CM | POA: Diagnosis not present

## 2022-10-24 DIAGNOSIS — E039 Hypothyroidism, unspecified: Secondary | ICD-10-CM | POA: Diagnosis not present

## 2022-10-24 DIAGNOSIS — Z7982 Long term (current) use of aspirin: Secondary | ICD-10-CM | POA: Insufficient documentation

## 2022-10-24 DIAGNOSIS — K633 Ulcer of intestine: Secondary | ICD-10-CM

## 2022-10-24 DIAGNOSIS — K921 Melena: Secondary | ICD-10-CM | POA: Diagnosis present

## 2022-10-24 DIAGNOSIS — K635 Polyp of colon: Secondary | ICD-10-CM | POA: Insufficient documentation

## 2022-10-24 LAB — COMPREHENSIVE METABOLIC PANEL
ALT: 27 U/L (ref 0–44)
AST: 27 U/L (ref 15–41)
Albumin: 3.5 g/dL (ref 3.5–5.0)
Alkaline Phosphatase: 67 U/L (ref 38–126)
Anion gap: 7 (ref 5–15)
BUN: 9 mg/dL (ref 8–23)
CO2: 29 mmol/L (ref 22–32)
Calcium: 8.7 mg/dL — ABNORMAL LOW (ref 8.9–10.3)
Chloride: 102 mmol/L (ref 98–111)
Creatinine, Ser: 0.89 mg/dL (ref 0.61–1.24)
GFR, Estimated: >60 mL/min (ref >60)
Glucose, Bld: 116 mg/dL — ABNORMAL HIGH (ref 70–99)
Potassium: 3.3 mmol/L — ABNORMAL LOW (ref 3.5–5.1)
Sodium: 138 mmol/L (ref 135–145)
Total Bilirubin: 0.6 mg/dL (ref 0.3–1.2)
Total Protein: 6.3 g/dL — ABNORMAL LOW (ref 6.5–8.1)

## 2022-10-24 LAB — PROTIME-INR
INR: 0.9 (ref 0.8–1.2)
Prothrombin Time: 12 seconds (ref 11.4–15.2)

## 2022-10-24 LAB — CBC WITH DIFFERENTIAL/PLATELET
Abs Immature Granulocytes: 0.1 10*3/uL — ABNORMAL HIGH (ref 0.00–0.07)
Basophils Absolute: 0 10*3/uL (ref 0.0–0.1)
Basophils Relative: 0 %
Eosinophils Absolute: 0.2 10*3/uL (ref 0.0–0.5)
Eosinophils Relative: 2 %
HCT: 23.3 % — ABNORMAL LOW (ref 39.0–52.0)
Hemoglobin: 7.6 g/dL — ABNORMAL LOW (ref 13.0–17.0)
Immature Granulocytes: 1 %
Lymphocytes Relative: 13 %
Lymphs Abs: 1.3 10*3/uL (ref 0.7–4.0)
MCH: 29.9 pg (ref 26.0–34.0)
MCHC: 32.6 g/dL (ref 30.0–36.0)
MCV: 91.7 fL (ref 80.0–100.0)
Monocytes Absolute: 0.8 10*3/uL (ref 0.1–1.0)
Monocytes Relative: 8 %
Neutro Abs: 7.5 10*3/uL (ref 1.7–7.7)
Neutrophils Relative %: 76 %
Platelets: 199 10*3/uL (ref 150–400)
RBC: 2.54 MIL/uL — ABNORMAL LOW (ref 4.22–5.81)
RDW: 13.7 % (ref 11.5–15.5)
WBC: 10 10*3/uL (ref 4.0–10.5)
nRBC: 0.2 % (ref 0.0–0.2)

## 2022-10-24 LAB — PREPARE RBC (CROSSMATCH)

## 2022-10-24 MED ORDER — LEVOTHYROXINE SODIUM 25 MCG PO TABS
25.0000 ug | ORAL_TABLET | Freq: Every day | ORAL | Status: DC
  Administered 2022-10-25 – 2022-10-27 (×3): 25 ug via ORAL
  Filled 2022-10-24 (×3): qty 1

## 2022-10-24 MED ORDER — POTASSIUM CHLORIDE CRYS ER 20 MEQ PO TBCR
40.0000 meq | EXTENDED_RELEASE_TABLET | Freq: Once | ORAL | Status: AC
  Administered 2022-10-24: 40 meq via ORAL
  Filled 2022-10-24: qty 2

## 2022-10-24 MED ORDER — SODIUM CHLORIDE 0.9 % IV SOLN: INTRAVENOUS | Status: DC

## 2022-10-24 MED ORDER — PANTOPRAZOLE SODIUM 40 MG IV SOLR
40.0000 mg | Freq: Two times a day (BID) | INTRAVENOUS | Status: DC
  Administered 2022-10-24 – 2022-10-27 (×6): 40 mg via INTRAVENOUS
  Filled 2022-10-24 (×6): qty 10

## 2022-10-24 MED ORDER — ACETAMINOPHEN 650 MG RE SUPP: 650.0000 mg | Freq: Four times a day (QID) | RECTAL | Status: DC | PRN

## 2022-10-24 MED ORDER — ONDANSETRON HCL 4 MG/2ML IJ SOLN
4.0000 mg | Freq: Four times a day (QID) | INTRAMUSCULAR | Status: DC | PRN
  Administered 2022-10-25: 4 mg via INTRAVENOUS
  Filled 2022-10-24: qty 2

## 2022-10-24 MED ORDER — IOHEXOL 350 MG/ML SOLN
80.0000 mL | Freq: Once | INTRAVENOUS | Status: AC | PRN
  Administered 2022-10-24: 80 mL via INTRAVENOUS

## 2022-10-24 MED ORDER — SODIUM CHLORIDE 0.9% IV SOLUTION: Freq: Once | INTRAVENOUS | Status: DC

## 2022-10-24 MED ORDER — PEG 3350-KCL-NA BICARB-NACL 420 G PO SOLR
4000.0000 mL | Freq: Once | ORAL | Status: AC
  Administered 2022-10-24: 4000 mL via ORAL
  Filled 2022-10-24: qty 4000

## 2022-10-24 MED ORDER — ACETAMINOPHEN 325 MG PO TABS
650.0000 mg | ORAL_TABLET | Freq: Four times a day (QID) | ORAL | Status: DC | PRN
  Administered 2022-10-25: 650 mg via ORAL
  Filled 2022-10-24: qty 2

## 2022-10-24 MED ORDER — ONDANSETRON HCL 4 MG PO TABS: 4.0000 mg | ORAL_TABLET | Freq: Four times a day (QID) | ORAL | Status: DC | PRN

## 2022-10-24 MED ORDER — HYDRALAZINE HCL 20 MG/ML IJ SOLN: 10.0000 mg | INTRAMUSCULAR | Status: DC | PRN

## 2022-10-24 NOTE — ED Notes (Signed)
Lab called that blood product is ready. Per provider he explained to patient that we will transfuse if hgb drops lower.

## 2022-10-24 NOTE — H&P (Signed)
History and Physical  Tim Cunningham PJK:[id number] DOB: [birth year] DOA: 10/24/2022  Referring physician: Dr. Davonna Belling PCP: Leslie Andrea, MD   Chief Complaint: Blood in stool  HPI: Tim Cunningham is a 71 y.o. male with a history of diverticular bleed, carcinoma tumor resection, Hypothyroidism, HTN, HLD, and R knee arthritis who presented to the ED on 10/24/22 with blood in the stool. He was in the ED on 10/21/22 for hypertension and reddish stool. He was found to be anemia, but was released without further workup and told to follow with his PCP. PCP found he was still anemic and still reported red stools, so he was sent back to the ED today for further workup. Last blood stool was yesterday. He is being admitted for suspected GI bleed.  ED Course: CMP showed K+ 3.3, Glucose 116, Ca8.7 with normal (3.5) albumin, total protein of 6.3 CBC showed hgb 7.6 FOBT ordered and is pending. Abdominal CT showed No active arterial GI bleed, possible colitis or diverticulitis, cholelithiasis, and prostatomegaly. PTT 12.0, INR 0.9 Type and screen completed in ED.  Review of Systems: All systems reviewed and apart from history of presenting illness, are negative.  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 Oneida Alar: SML IH, RECTAL CARCINOID, HYPEPRPLASTIC POLYPS(2), Moderate TICS/Small lipoma IN HF   COLONOSCOPY N/A 03/15/2015   Dr. Gala Romney: Pancolonic diverticulosis. Arterial bleeding arising from a cecal diverticulum status post hemostasis clip placement and injection therapy . Stie of prior rectal mucosal tattooing identified with no evidence of recurrent carcinoid tumor. No EGD done today,.   ESOPHAGOGASTRODUODENOSCOPY  11/01/2012    Dr. Fields:Schatzki ring was found/MILD GASTRITIS/DUODENIIS   EUS  12/11/2012   RECTAL CARCINOID EXCISED-MARGINS NOT CLEAR   FLEXIBLE SIGMOIDOSCOPY  Jan 2016   WITH EUS. Dr. Paulita Fujita. NO residual lesion identified   TONSILLECTOMY     Social History:  reports that he quit smoking about 41 years ago. His smoking use included cigarettes. He has a 5.00 pack-year smoking history. He has never used smokeless tobacco. He reports current alcohol use. He reports that he does not use drugs.  No Known Allergies  Family History  Problem Relation Age of Onset   Cancer Mother    Cancer Maternal Uncle    Colon cancer Other        maternal great uncle   Lung cancer Other        uncles    Prior to Admission medications   Medication Sig Start Date End Date Taking? Authorizing Provider  aspirin EC 81 MG tablet Take 1 tablet (81 mg total) by mouth every other day. Resume in 1 week 03/16/15  Yes Kathie Dike, MD  hydrochlorothiazide (HYDRODIURIL) 25 MG tablet Take 25 mg by mouth daily. for high blood pressure 01/17/19  Yes [provider]  levothyroxine (SYNTHROID) 25 MCG tablet TAKE 1 TABLET BY MOUTH ONCE DAILY FOR LOW THYROID 11/20/18  Yes [provider]  losartan (COZAAR) 50 MG tablet Take 50 mg by mouth daily. 09/19/22  Yes [provider]  lovastatin (MEVACOR) 40 MG tablet Take 40 mg by mouth daily. 09/09/22  Yes [provider]  Multiple Vitamin (MULTIVITAMIN WITH MINERALS) TABS Take 1  tablet by mouth every morning.   Yes [provider]  omeprazole (PRILOSEC) 20 MG capsule TAKE 1 CAPSULE BY MOUTH ONCE DAILY BEFORE  BREAKFAST 05/09/18  Yes Annitta Needs, NP  sildenafil (REVATIO) 20 MG tablet Take 20 mg by mouth daily as needed (FOR ERECTILE DYSFuncation). 04/27/21  Yes [provider]  HYDROcodone-acetaminophen (NORCO) 5-325 MG tablet Take 1-2 tablets by mouth every 6 (six) hours as needed. Patient not taking: Reported on 10/24/2022 07/22/21   Veryl Speak, MD   Physical Exam: Vitals:   10/24/22 1037 10/24/22 1103  BP: (!) 159/68 122/67  Pulse: 91 87  Resp: 17   Temp: 98.3 F (36.8 C)   TempSrc: Oral   SpO2: 100% 96%    General exam: Moderately built and nourished patient, lying comfortably supine on the gurney in no obvious distress. Head, eyes and ENT: Nontraumatic and normocephalic. Pupils equally reacting to light and accommodation. Oral mucosa moist. Neck: Supple. No JVD, carotid bruit or thyromegaly. Lymphatics: No lymphadenopathy. Respiratory system: Clear to auscultation. No increased work of breathing. Cardiovascular system: S1 and S2 heard, RRR. No JVD, murmurs, gallops, clicks or pedal edema. Gastrointestinal system: Abdomen is nondistended, soft and nontender. Normal bowel sounds heard. No organomegaly or masses appreciated. Central nervous system: Alert and oriented. No focal neurological deficits. Extremities: No gross deformities or lesions Skin: No rashes or acute findings. Musculoskeletal system: Negative exam. Psychiatry: Pleasant and cooperative.  Labs on Admission:  Basic Metabolic Panel: Recent Labs  Lab 10/21/22 2017 10/24/22 1155  NA 137 138  K 3.1* 3.3*  CL 103 102  CO2 25 29  GLUCOSE 198* 116*  BUN 16 9  CREATININE 1.10 0.89  CALCIUM 8.5* 8.7*   Liver Function Tests: Recent Labs  Lab 10/24/22 1155  AST 27  ALT 27  ALKPHOS 67  BILITOT 0.6  PROT 6.3*  ALBUMIN 3.5   No results for input(s): "LIPASE", "AMYLASE" in the last 168 hours. No results for input(s): "AMMONIA" in the last 168 hours. CBC: Recent Labs  Lab 10/21/22 2017 10/24/22 1155  WBC 12.2* 10.0  NEUTROABS  --  7.5  HGB 8.9* 7.6*  HCT 27.6* 23.3*  MCV 89.6 91.7  PLT 196 199   Cardiac Enzymes: No results for input(s): "CKTOTAL", "CKMB", "CKMBINDEX", "TROPONINI" in the last 168 hours.  BNP (last 3 results) No results for input(s): "PROBNP" in the last 8760 hours. CBG: No results for input(s): "GLUCAP" in the  last 168 hours.  Radiological Exams on Admission: CT ANGIO GI BLEED  Result Date: 10/24/2022 CLINICAL DATA:  Rectal bleeding with decreased appetite. EXAM: CTA ABDOMEN AND PELVIS WITHOUT AND WITH CONTRAST TECHNIQUE: Multidetector CT imaging of the abdomen and pelvis was performed using the standard protocol during bolus administration of intravenous contrast. Multiplanar reconstructed images and MIPs were obtained and reviewed to evaluate the vascular anatomy. RADIATION DOSE REDUCTION: This exam was performed according to the departmental dose-optimization program which includes automated exposure control, adjustment of the mA and/or kV according to patient size and/or use of iterative reconstruction technique. CONTRAST:  38m OMNIPAQUE IOHEXOL 350 MG/ML SOLN COMPARISON:  08/26/2013 FINDINGS: VASCULAR Aorta: Normal caliber aorta without aneurysm, dissection, vasculitis or significant stenosis. Atherosclerotic calcification evident. Celiac: Patent without evidence of aneurysm, dissection, vasculitis or significant stenosis. SMA: Patent without evidence of aneurysm, dissection, vasculitis or significant stenosis. Renals: Both renal arteries are patent without evidence of aneurysm, dissection, vasculitis, fibromuscular dysplasia or significant stenosis. IMA: Patent without evidence of aneurysm, dissection, vasculitis or significant  stenosis. Inflow: Patent without evidence of aneurysm, dissection, vasculitis or significant stenosis. Proximal Outflow: Bilateral common femoral and visualized portions of the superficial and profunda femoral arteries are patent without evidence of aneurysm, dissection, vasculitis or significant stenosis. Veins: No obvious venous abnormality within the limitations of this arterial phase study. Review of the MIP images confirms the above findings. NON-VASCULAR Lower chest: Unremarkable Hepatobiliary: No suspicious focal abnormality within the liver parenchyma. Gallbladder is distended  with numerous noncalcified gallstones evident. No intrahepatic or extrahepatic biliary dilation. Pancreas: No focal mass lesion. No dilatation of the main duct. No intraparenchymal cyst. No peripancreatic edema. Spleen: No splenomegaly. No focal mass lesion. Adrenals/Urinary Tract: No adrenal nodule or mass. Kidneys unremarkable. No evidence for hydroureter. The urinary bladder appears normal for the degree of distention. Stomach/Bowel: Stomach is unremarkable. No gastric wall thickening. No evidence of outlet obstruction. Duodenum is normally positioned as is the ligament of Treitz. No small bowel wall thickening. No small bowel dilatation. The terminal ileum is normal. The wall the cecum is ill-defined in there is subtle pericecal edema (see axial 150/11) in the region of the ileocecal valve. Multiple colonic diverticuli evident including in the cecum/right colon. No evidence for arterial phase contrast extravasation in the stomach, small bowel, or colon to suggest active arterial GI hemorrhage. Lymphatic: Upper normal lymph nodes identified in the hepatoduodenal ligament. No retroperitoneal or mesenteric lymphadenopathy. No pelvic sidewall lymphadenopathy. Reproductive: Prostate gland is enlarged. Other: No intraperitoneal free fluid. Musculoskeletal: No worrisome lytic or sclerotic osseous abnormality. IMPRESSION: 1. No evidence for arterial phase contrast extravasation in the stomach, small bowel, or colon to suggest active arterial GI hemorrhage. 2. The wall of the cecum is ill-defined and there is subtle pericecal edema in the region of the ileocecal valve. Imaging features are nonspecific and may be related to infectious/inflammatory colitis. Given the numerous right-sided colonic diverticuli, diverticulitis would also be a consideration. 3. Cholelithiasis. 4. Prostatomegaly. Electronically Signed   By: Misty Stanley M.D.   On: 10/24/2022 14:19     Assessment/Plan  Normocytic anemia in the setting of  potential GI Bleed -history of diverticular bleed, carcinoma tumor resection -hgb 7.6 on admission -Clear liquid diet, NPO at midnight -GI consult, appreciate recs -Abdominal CT showed No active arterial GI bleed, possible colitis or diverticulitis, cholelithiasis, and prostatomegaly -PTT 12.0, INR 0.9 -Transfuse if hgb<7 -Check H/H every 6h to monitor anemia -IVF -Protonix IV  Hypokalemia -K+ 3.3 on admission -IVF -Potassium repletion, recheck with BMP tomorrow  HTN -Continue home losartan -Continue home hydrodiuril -BP 122/67 1103 10/24/22  Hypothyroidism -Continue home synthroid -Last TSH 5.13 on 09/02/22  HLD -continue home lovastatin  R knee arthritis -No current pain -Continue to monitor  DVT Prophylaxis: SCDs Code Status: Full  Family Communication: Wife at bedside  Disposition Plan: Home once stable   Time spent: 64mn  Tim Cunningham D Pearson Reasons, OMS IV [facility] How to contact the TSamaritan Endoscopy LLCAttending or Consulting provider [place] provider during after hours [place] for this patient?  Check the care team in [facility] Centerand look for a) attending/consulting [facility] provider listed and b) the [facility] Creekteam listed Log into [email or web address] and use [place]'s universal password to access. If you do not have the password, please contact the hospital operator. Locate the TBig South Fork Medical Centerprovider you are looking for under [facility] and page to a number that you can be directly reached. If you still have difficulty reaching the provider, please page the DNav[facility](Director on Call) for  the Hospitalists listed on amion for assistance.

## 2022-10-24 NOTE — ED Triage Notes (Signed)
Pt here Saturday for same, rectal bleeding. Seen pcp yesterday and was called today due to abnormal labs. Last bloody stool was yesterday and is dark red when he wipes. States is not in toilet now like it was. Pt denies dizziness or weakness. Denies abd pain. C/o decreased appetite.

## 2022-10-24 NOTE — ED Provider Notes (Addendum)
[name]   CSN: [id number] Arrival date & time: 10/24/22  1020     History  Chief Complaint  Patient presents with   Rectal Bleeding    Tim Cunningham is a 71 y.o. male.   Rectal Bleeding Patient presents with rectal bleeding.  Has had blood in the stool for the last few days.  Patient thought the redness was due to red valve take his head.  Came to the ER for high blood pressure and found of a hemoglobin down in the eights.  Not lightheaded or dizzy.  Do not see the guaiac testing was done.  Follow-up with PCP and recheck hemoglobin today and gone down to lower eights.  Sent in here.  Had some abdominal pain previously.  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 Authorizing Provider  aspirin EC 81 MG tablet Take 1 tablet (81 mg total) by mouth every other day. Resume in 1 week 03/16/15  Yes Erick Blinks, MD  hydrochlorothiazide (HYDRODIURIL) 25 MG tablet Take 25 mg by mouth daily. for high blood pressure 01/17/19  Yes [provider]  levothyroxine (SYNTHROID) 25 MCG tablet TAKE 1 TABLET BY MOUTH ONCE DAILY FOR LOW THYROID 11/20/18  Yes [provider]  losartan (COZAAR) 50 MG tablet Take 50 mg by mouth daily. 09/19/22  Yes [provider]  lovastatin (MEVACOR) 40 MG tablet Take 40 mg by mouth daily. 09/09/22  Yes [provider]  Multiple Vitamin (MULTIVITAMIN WITH MINERALS) TABS Take 1 tablet by mouth every morning.   Yes [provider]  omeprazole (PRILOSEC) 20 MG capsule TAKE 1 CAPSULE BY MOUTH ONCE DAILY BEFORE  BREAKFAST  05/09/18  Yes Gelene Mink, NP  sildenafil (REVATIO) 20 MG tablet Take 20 mg by mouth daily as needed (FOR ERECTILE DYSFuncation). 04/27/21  Yes [provider]  HYDROcodone-acetaminophen (NORCO) 5-325 MG tablet Take 1-2 tablets by mouth every 6 (six) hours as needed. Patient not taking: Reported on 10/24/2022 07/22/21   Geoffery Lyons, MD      Allergies    Patient has no known allergies.    Review of Systems   Review of Systems  Gastrointestinal:  Positive for hematochezia.    Physical Exam Updated Vital Signs BP 119/62 (BP Location: Left Arm)   Pulse 67   Temp 97.8 F (36.6 C)   Resp 18   SpO2 100%  Physical Exam Vitals and nursing note reviewed.  Eyes:     Pupils: Pupils are equal, round, and reactive to light.  Cardiovascular:     Rate and Rhythm: Normal rate.  Pulmonary:     Breath sounds: No wheezing.  Abdominal:     Tenderness: There is no abdominal tenderness.  Skin:    Capillary Refill: Capillary refill takes less than 2 seconds.     Coloration: Skin is not pale.  Neurological:     Mental Status: He is alert and oriented to person, place, and time.     ED Results / Procedures / Treatments   Labs (all labs ordered are listed, but only abnormal results are  displayed) Labs Reviewed  COMPREHENSIVE METABOLIC PANEL - Abnormal; Notable for the following components:      Result Value   Potassium 3.3 (*)    Glucose, Bld 116 (*)    Calcium 8.7 (*)    Total Protein 6.3 (*)    All other components within normal limits  CBC WITH DIFFERENTIAL/PLATELET - Abnormal; Notable for the following components:   RBC 2.54 (*)    Hemoglobin 7.6 (*)    HCT 23.3 (*)    Abs Immature Granulocytes 0.10 (*)    All other components within normal limits  BASIC METABOLIC PANEL - Abnormal; Notable for the following components:   Glucose, Bld 107 (*)    BUN 7 (*)    Calcium 8.3 (*)    All other components within normal limits  CBC - Abnormal; Notable for the following components:    RBC 2.76 (*)    Hemoglobin 8.0 (*)    HCT 24.7 (*)    All other components within normal limits  PROTIME-INR  MAGNESIUM  POC OCCULT BLOOD, ED  TYPE AND SCREEN  PREPARE RBC (CROSSMATCH)    EKG None  Radiology CT ANGIO GI BLEED  Result Date: 10/24/2022 CLINICAL DATA:  Rectal bleeding with decreased appetite. EXAM: CTA ABDOMEN AND PELVIS WITHOUT AND WITH CONTRAST TECHNIQUE: Multidetector CT imaging of the abdomen and pelvis was performed using the standard protocol during bolus administration of intravenous contrast. Multiplanar reconstructed images and MIPs were obtained and reviewed to evaluate the vascular anatomy. RADIATION DOSE REDUCTION: This exam was performed according to the departmental dose-optimization program which includes automated exposure control, adjustment of the mA and/or kV according to patient size and/or use of iterative reconstruction technique. CONTRAST:  80mL OMNIPAQUE IOHEXOL 350 MG/ML SOLN COMPARISON:  08/26/2013 FINDINGS: VASCULAR Aorta: Normal caliber aorta without aneurysm, dissection, vasculitis or significant stenosis. Atherosclerotic calcification evident. Celiac: Patent without evidence of aneurysm, dissection, vasculitis or significant stenosis. SMA: Patent without evidence of aneurysm, dissection, vasculitis or significant stenosis. Renals: Both renal arteries are patent without evidence of aneurysm, dissection, vasculitis, fibromuscular dysplasia or significant stenosis. IMA: Patent without evidence of aneurysm, dissection, vasculitis or significant stenosis. Inflow: Patent without evidence of aneurysm, dissection, vasculitis or significant stenosis. Proximal Outflow: Bilateral common femoral and visualized portions of the superficial and profunda femoral arteries are patent without evidence of aneurysm, dissection, vasculitis or significant stenosis. Veins: No obvious venous abnormality within the limitations of this arterial phase study. Review of the MIP  images confirms the above findings. NON-VASCULAR Lower chest: Unremarkable Hepatobiliary: No suspicious focal abnormality within the liver parenchyma. Gallbladder is distended with numerous noncalcified gallstones evident. No intrahepatic or extrahepatic biliary dilation. Pancreas: No focal mass lesion. No dilatation of the main duct. No intraparenchymal cyst. No peripancreatic edema. Spleen: No splenomegaly. No focal mass lesion. Adrenals/Urinary Tract: No adrenal nodule or mass. Kidneys unremarkable. No evidence for hydroureter. The urinary bladder appears normal for the degree of distention. Stomach/Bowel: Stomach is unremarkable. No gastric wall thickening. No evidence of outlet obstruction. Duodenum is normally positioned as is the ligament of Treitz. No small bowel wall thickening. No small bowel dilatation. The terminal ileum is normal. The wall the cecum is ill-defined in there is subtle pericecal edema (see axial 150/11) in the region of the ileocecal valve. Multiple colonic diverticuli evident including in the cecum/right colon. No evidence for arterial phase contrast extravasation in the stomach, small bowel, or colon to suggest active arterial GI hemorrhage. Lymphatic: Upper normal lymph nodes identified in the hepatoduodenal  ligament. No retroperitoneal or mesenteric lymphadenopathy. No pelvic sidewall lymphadenopathy. Reproductive: Prostate gland is enlarged. Other: No intraperitoneal free fluid. Musculoskeletal: No worrisome lytic or sclerotic osseous abnormality. IMPRESSION: 1. No evidence for arterial phase contrast extravasation in the stomach, small bowel, or colon to suggest active arterial GI hemorrhage. 2. The wall of the cecum is ill-defined and there is subtle pericecal edema in the region of the ileocecal valve. Imaging features are nonspecific and may be related to infectious/inflammatory colitis. Given the numerous right-sided colonic diverticuli, diverticulitis would also be a  consideration. 3. Cholelithiasis. 4. Prostatomegaly. Electronically Signed   By: Kennith Center M.D.   On: 10/24/2022 14:19    Procedures Procedures    Medications Ordered in ED Medications  0.9 %  sodium chloride infusion ( Intravenous New Bag/Given 10/25/22 0525)  levothyroxine (SYNTHROID) tablet 25 mcg (25 mcg Oral Given 10/25/22 0522)  acetaminophen (TYLENOL) tablet 650 mg (has no administration in time range)    Or  acetaminophen (TYLENOL) suppository 650 mg (has no administration in time range)  ondansetron (ZOFRAN) tablet 4 mg (has no administration in time range)    Or  ondansetron (ZOFRAN) injection 4 mg (has no administration in time range)  pantoprazole (PROTONIX) injection 40 mg (40 mg Intravenous Given 10/25/22 0847)  hydrALAZINE (APRESOLINE) injection 10 mg (has no administration in time range)  0.9 %  sodium chloride infusion (Manually program via Guardrails IV Fluids) (has no administration in time range)  iohexol (OMNIPAQUE) 350 MG/ML injection 80 mL (80 mLs Intravenous Contrast Given 10/24/22 1347)  potassium chloride SA (KLOR-CON M) CR tablet 40 mEq (40 mEq Oral Given 10/24/22 1854)  polyethylene glycol-electrolytes (NuLYTELY) solution 4,000 mL (4,000 mLs Oral Given 10/24/22 1854)    ED Course/ Medical Decision Making/ A&P                           Medical Decision Making Amount and/or Complexity of Data Reviewed Labs: ordered. Radiology: ordered.  Risk Prescription drug management. Decision regarding hospitalization.   Patient was anemia.  Worsening.  Red stools that is likely blood.  States less blood today than it was before.  Saw PCP and got sent in for decreasing hemoglobins.  Differential diagnosis includes most likely lower GI bleed.  Has had diverticular bleed in the past also had a carcinoid tumor.  Hemoglobin is now decreased more down to 7.6.  Will get CT angiography to further evaluate.  Appears hemodynamically stable at this time so far.  CT angiography  does not show bleed but does show some enterocolitis in the cecal area.  Discussed with hospitalist admit.  Discussed with GI who will see in consult.  CRITICAL CARE Performed by: Benjiman Core Total critical care time: 30 minutes Critical care time was exclusive of separately billable procedures and treating other patients. Critical care was necessary to treat or prevent imminent or life-threatening deterioration. Critical care was time spent personally by me on the following activities: development of treatment plan with patient and/or surrogate as well as nursing, discussions with consultants, evaluation of patient's response to treatment, examination of patient, obtaining history from patient or surrogate, ordering and performing treatments and interventions, ordering and review of laboratory studies, ordering and review of radiographic studies, pulse oximetry and re-evaluation of patient's condition.       Final Clinical Impression(s) / ED Diagnoses Final diagnoses:  Lower GI bleed  Blood loss anemia    Rx / DC Orders ED Discharge Orders  None         Benjiman Core, MD 10/25/22 1005    Benjiman Core, MD 10/25/22 1018

## 2022-10-24 NOTE — H&P (View-Only) (Signed)
Gastroenterology Consult   Referring Provider: Dr. Manuella Ghazi Primary Care Physician:  Leslie Andrea, MD Primary Gastroenterologist:  previously Dr. Oneida Alar   Patient ID: Tim Cunningham; [id number]; October 12, 1951   Admit date: 10/24/2022  LOS: 0 days   Date of Consultation: 10/24/2022  Reason for Consultation:  GI bleed   History of Present Illness   Tim Cunningham is a 71 y.o. year old male with a history of rectal carcinoid tumor s/p transanal resection by Dr. Paulita Fujita in Jan 2014, diverticular bleed in 2016 s/p clip placement and epi,  Tumor nests were seen at the resection border, but the plan was to follow periodically with endoscopic evaluations and not pursue surgical intervention. Presented over the weekend to the ED with rectal bleeding and Hgb 8.9, down from 12.8 earlier this year. Presenting again today at the request of PCP due to outside labs and rectal bleeding.   In the ED: Hgb 7.6, down from 8.9 several days ago. Previously 12.8 five months ago. INR 0.9. CT angiogram without extravasation. Wall of cecum ill-defined and subtle pericecal edema in region of the IC valve non-specific. Numerous right-sided diverticuli.   Patient notes onset of low-volume hematochezia with bowel movements starting prior to Thanksgiving. Noted with each bowel movement. Straining around that time. Last rectal bleeding yesterday. No melena. No abdominal pain. No N/V. Takes Aleve for back pain. Took several Goody powders over the weekend. No weight loss, lack of appetite. No dysphagia. No anticoagulation.    Last colonoscopy 2016: Pancolonic diverticulosis. Arterial bleeding arising from a cecal diverticulum status post hemostasis clip placement and injection therapy . Site of prior rectal mucosal tattooing identified with no evidence of recurrent carcinoid tumor.   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 Oneida Alar: SML IH, RECTAL CARCINOID, HYPEPRPLASTIC POLYPS(2), Moderate TICS/Small lipoma IN HF   COLONOSCOPY N/A 03/15/2015   Dr. Gala Romney: Pancolonic diverticulosis. Arterial bleeding arising from a cecal diverticulum status post hemostasis clip placement and injection therapy . Stie of prior rectal mucosal tattooing identified with no evidence of recurrent carcinoid tumor. No EGD done today,.   ESOPHAGOGASTRODUODENOSCOPY  11/01/2012   Dr. Fields:Schatzki ring was found/MILD GASTRITIS/DUODENIIS   EUS  12/11/2012   RECTAL CARCINOID EXCISED-MARGINS NOT CLEAR   FLEXIBLE SIGMOIDOSCOPY  Jan 2016   WITH EUS. Dr. Paulita Fujita. NO residual lesion identified   TONSILLECTOMY      Prior to Admission medications   Medication Sig Start Date End Date Taking? Authorizing Provider  aspirin EC 81 MG tablet Take 1 tablet (81 mg total) by mouth every other day. Resume in 1 week 03/16/15  Yes Kathie Dike, MD  hydrochlorothiazide (HYDRODIURIL) 25 MG tablet Take 25 mg by mouth daily. for high blood pressure 01/17/19  Yes [provider]  levothyroxine (SYNTHROID) 25 MCG tablet TAKE 1 TABLET BY MOUTH ONCE DAILY FOR LOW THYROID 11/20/18  Yes [provider]  losartan (COZAAR) 50 MG tablet Take 50 mg by mouth daily. 09/19/22  Yes [provider]  lovastatin (MEVACOR) 40 MG tablet Take 40 mg by mouth daily. 09/09/22  Yes [provider]  Multiple Vitamin (MULTIVITAMIN WITH MINERALS) TABS Take 1  tablet by mouth every morning.   Yes [provider]  omeprazole (PRILOSEC) 20 MG capsule TAKE 1 CAPSULE BY MOUTH ONCE DAILY BEFORE  BREAKFAST 05/09/18  Yes Annitta Needs, NP  sildenafil (REVATIO) 20 MG tablet Take 20 mg by mouth daily as needed (FOR ERECTILE  DYSFuncation). 04/27/21  Yes [provider]  HYDROcodone-acetaminophen (NORCO) 5-325 MG tablet Take 1-2 tablets by mouth every 6 (six) hours as needed. Patient not taking: Reported on 10/24/2022 07/22/21   Veryl Speak, MD    Current Facility-Administered Medications  Medication Dose Route Frequency Provider Last Rate Last Admin   0.9 %  sodium chloride infusion   Intravenous Continuous Davonna Belling, MD 125 mL/hr at 10/24/22 1341 New Bag at 10/24/22 1341   Current Outpatient Medications  Medication Sig Dispense Refill   aspirin EC 81 MG tablet Take 1 tablet (81 mg total) by mouth every other day. Resume in 1 week     hydrochlorothiazide (HYDRODIURIL) 25 MG tablet Take 25 mg by mouth daily. for high blood pressure     levothyroxine (SYNTHROID) 25 MCG tablet TAKE 1 TABLET BY MOUTH ONCE DAILY FOR LOW THYROID     losartan (COZAAR) 50 MG tablet Take 50 mg by mouth daily.     lovastatin (MEVACOR) 40 MG tablet Take 40 mg by mouth daily.     Multiple Vitamin (MULTIVITAMIN WITH MINERALS) TABS Take 1 tablet by mouth every morning.     omeprazole (PRILOSEC) 20 MG capsule TAKE 1 CAPSULE BY MOUTH ONCE DAILY BEFORE  BREAKFAST 30 capsule 5   sildenafil (REVATIO) 20 MG tablet Take 20 mg by mouth daily as needed (FOR ERECTILE DYSFuncation).     HYDROcodone-acetaminophen (NORCO) 5-325 MG tablet Take 1-2 tablets by mouth every 6 (six) hours as needed. (Patient not taking: Reported on 10/24/2022) 10 tablet 0    Allergies as of 10/24/2022   (No Known Allergies)    Family History  Problem Relation Age of Onset   Cancer Mother    Cancer Maternal Uncle    Colon cancer Other        maternal great uncle   Lung cancer Other        uncles    Social History   Socioeconomic History   Marital status: Divorced    Spouse name: Not on file   Number of children: 5   Years of education: Not on file   Highest education level: Not on file  Occupational History   Occupation: unemployed  Tobacco Use    Smoking status: Former    Packs/day: 0.50    Years: 10.00    Total pack years: 5.00    Types: Cigarettes    Quit date: 11/01/1981    Years since quitting: 41.0   Smokeless tobacco: Never   Tobacco comments:    quit 1999  Substance and Sexual Activity   Alcohol use: Yes    Comment: 1-2 drinks a month   Drug use: No   Sexual activity: Yes    Birth control/protection: None  Other Topics Concern   Not on file  Social History Narrative   Not on file   Social Determinants of Health   Financial Resource Strain: Not on file  Food Insecurity: Not on file  Transportation Needs: Not on file  Physical Activity: Not on file  Stress: Not on file  Social Connections: Not on file  Intimate Partner Violence: Not on file     Review of Systems   Gen: Denies any fever,  chills, loss of appetite, change in weight or weight loss CV: Denies chest pain, heart palpitations, syncope, edema  Resp: Denies shortness of breath with rest, cough, wheezing, coughing up blood, and pleurisy. GI: see HPI GU : Denies urinary burning, blood in urine, urinary frequency, and urinary incontinence. MS: Denies joint pain, limitation of movement, swelling, cramps, and atrophy.  Derm: Denies rash, itching, dry skin, hives. Psych: Denies depression, anxiety, memory loss, hallucinations, and confusion. Heme: Denies bruising or bleeding Neuro:  Denies any headaches, dizziness, paresthesias, shaking  Physical Exam   Vital Signs in last 24 hours: Temp:  [98.3 F (36.8 C)] 98.3 F (36.8 C) (12/05 1037) Pulse Rate:  [87-91] 87 (12/05 1103) Resp:  [17] 17 (12/05 1037) BP: (122-159)/(67-68) 122/67 (12/05 1103) SpO2:  [96 %-100 %] 96 % (12/05 1103)    General:   Alert,  Well-developed, well-nourished, pleasant and cooperative in NAD Head:  Normocephalic and atraumatic. Eyes:  Sclera clear, no icterus.   Conjunctiva pink. Ears:  Normal auditory acuity. Mouth:  No deformity or lesions, dentition  normal. Neck:  Supple; no masses Lungs:  Clear throughout to auscultation.    Heart:  S1 S2 present without murmurs  Abdomen:  Soft, nontender and nondistended. No masses, hepatosplenomegaly. Umbilical hernia easily reducible.  Normal bowel sounds, without guarding, and without rebound.   Rectal: deferred   Msk:  Symmetrical without gross deformities. Normal posture. Extremities:  Without edema. Neurologic:  Alert and  oriented x4. Skin:  Intact without significant lesions or rashes. Psych:  Alert and cooperative. Normal mood and affect.  Intake/Output from previous day: No intake/output data recorded. Intake/Output this shift: No intake/output data recorded.   Labs/Studies   Recent Labs Recent Labs    10/21/22 2017 10/24/22 1155  WBC 12.2* 10.0  HGB 8.9* 7.6*  HCT 27.6* 23.3*  PLT 196 199   BMET Recent Labs    10/21/22 2017 10/24/22 1155  NA 137 138  K 3.1* 3.3*  CL 103 102  CO2 25 29  GLUCOSE 198* 116*  BUN 16 9  CREATININE 1.10 0.89  CALCIUM 8.5* 8.7*   LFT Recent Labs    10/24/22 1155  PROT 6.3*  ALBUMIN 3.5  AST 27  ALT 27  ALKPHOS 67  BILITOT 0.6   PT/INR Recent Labs    10/24/22 1155  LABPROT 12.0  INR 0.9     Radiology/Studies CT ANGIO GI BLEED  Result Date: 10/24/2022 CLINICAL DATA:  Rectal bleeding with decreased appetite. EXAM: CTA ABDOMEN AND PELVIS WITHOUT AND WITH CONTRAST TECHNIQUE: Multidetector CT imaging of the abdomen and pelvis was performed using the standard protocol during bolus administration of intravenous contrast. Multiplanar reconstructed images and MIPs were obtained and reviewed to evaluate the vascular anatomy. RADIATION DOSE REDUCTION: This exam was performed according to the departmental dose-optimization program which includes automated exposure control, adjustment of the mA and/or kV according to patient size and/or use of iterative reconstruction technique. CONTRAST:  48m OMNIPAQUE IOHEXOL 350 MG/ML SOLN  COMPARISON:  08/26/2013 FINDINGS: VASCULAR Aorta: Normal caliber aorta without aneurysm, dissection, vasculitis or significant stenosis. Atherosclerotic calcification evident. Celiac: Patent without evidence of aneurysm, dissection, vasculitis or significant stenosis. SMA: Patent without evidence of aneurysm, dissection, vasculitis or significant stenosis. Renals: Both renal arteries are patent without evidence of aneurysm, dissection, vasculitis, fibromuscular dysplasia or significant stenosis. IMA: Patent without evidence of aneurysm, dissection, vasculitis or significant stenosis. Inflow: Patent without evidence of aneurysm, dissection, vasculitis or significant stenosis. Proximal Outflow: Bilateral common femoral and  visualized portions of the superficial and profunda femoral arteries are patent without evidence of aneurysm, dissection, vasculitis or significant stenosis. Veins: No obvious venous abnormality within the limitations of this arterial phase study. Review of the MIP images confirms the above findings. NON-VASCULAR Lower chest: Unremarkable Hepatobiliary: No suspicious focal abnormality within the liver parenchyma. Gallbladder is distended with numerous noncalcified gallstones evident. No intrahepatic or extrahepatic biliary dilation. Pancreas: No focal mass lesion. No dilatation of the main duct. No intraparenchymal cyst. No peripancreatic edema. Spleen: No splenomegaly. No focal mass lesion. Adrenals/Urinary Tract: No adrenal nodule or mass. Kidneys unremarkable. No evidence for hydroureter. The urinary bladder appears normal for the degree of distention. Stomach/Bowel: Stomach is unremarkable. No gastric wall thickening. No evidence of outlet obstruction. Duodenum is normally positioned as is the ligament of Treitz. No small bowel wall thickening. No small bowel dilatation. The terminal ileum is normal. The wall the cecum is ill-defined in there is subtle pericecal edema (see axial 150/11) in the  region of the ileocecal valve. Multiple colonic diverticuli evident including in the cecum/right colon. No evidence for arterial phase contrast extravasation in the stomach, small bowel, or colon to suggest active arterial GI hemorrhage. Lymphatic: Upper normal lymph nodes identified in the hepatoduodenal ligament. No retroperitoneal or mesenteric lymphadenopathy. No pelvic sidewall lymphadenopathy. Reproductive: Prostate gland is enlarged. Other: No intraperitoneal free fluid. Musculoskeletal: No worrisome lytic or sclerotic osseous abnormality. IMPRESSION: 1. No evidence for arterial phase contrast extravasation in the stomach, small bowel, or colon to suggest active arterial GI hemorrhage. 2. The wall of the cecum is ill-defined and there is subtle pericecal edema in the region of the ileocecal valve. Imaging features are nonspecific and may be related to infectious/inflammatory colitis. Given the numerous right-sided colonic diverticuli, diverticulitis would also be a consideration. 3. Cholelithiasis. 4. Prostatomegaly. Electronically Signed   By: Misty Stanley M.D.   On: 10/24/2022 14:19     Assessment   Tim Cunningham is a 71 y.o. year old male with a history of rectal carcinoid tumor s/p transanal resection by Dr. Paulita Fujita in Jan 2014, diverticular bleed in 2016 s/p clip placement and epi, presenting over the weekend to the ED with rectal bleeding and Hgb 8.9, down from 12.8 earlier this year. Presenting again today at the request of PCP due to declining Hgb and rectal bleeding.    Rectal bleeding: notes onset of low-volume hematochezia with every BM prior to Thanksgiving. No abdominal pain. He does endorse Aleve routinely for back pain and took several Goody powders over the weekend. No anticoagulation. Desires colonoscopy. He does have a drop in Hgb from baseline. Hemodynamically stable. Last colonoscopy in 2016 due to diverticular bleed.   CTA with wall of cecum ill-defined and subtle pericecal  edema in region of the IC valve non-specific. Numerous right-sided diverticuli. He clinically is not presenting with diverticulitis.    Plan / Recommendations    Golytely today May have clear liquids, NPO after midnight PPI BID Colonoscopy tomorrow. Risks and benefits discussed with patient and wife at bedside with stated understanding. Avoid NSAIDs/aspirin powders       10/24/2022, 2:40 PM  Annitta Needs, PhD, [facility]

## 2022-10-24 NOTE — Consult Note (Signed)
Gastroenterology Consult   Referring Provider: Dr. Manuella Ghazi Primary Care Physician:  Leslie Andrea, MD Primary Gastroenterologist:  previously Dr. Oneida Alar   Patient ID: Tim Cunningham; [id number]; [birth year]   Admit date: 10/24/2022  LOS: 0 days   Date of Consultation: 10/24/2022  Reason for Consultation:  GI bleed   History of Present Illness   Tim Cunningham is a 71 y.o. year old male with a history of rectal carcinoid tumor s/p transanal resection by Dr. Paulita Fujita in Jan 2014, diverticular bleed in 2016 s/p clip placement and epi,  Tumor nests were seen at the resection border, but the plan was to follow periodically with endoscopic evaluations and not pursue surgical intervention. Presented over the weekend to the ED with rectal bleeding and Hgb 8.9, down from 12.8 earlier this year. Presenting again today at the request of PCP due to outside labs and rectal bleeding.   In the ED: Hgb 7.6, down from 8.9 several days ago. Previously 12.8 five months ago. INR 0.9. CT angiogram without extravasation. Wall of cecum ill-defined and subtle pericecal edema in region of the IC valve non-specific. Numerous right-sided diverticuli.   Patient notes onset of low-volume hematochezia with bowel movements starting prior to Thanksgiving. Noted with each bowel movement. Straining around that time. Last rectal bleeding yesterday. No melena. No abdominal pain. No N/V. Takes Aleve for back pain. Took several Goody powders over the weekend. No weight loss, lack of appetite. No dysphagia. No anticoagulation.    Last colonoscopy 2016: Pancolonic diverticulosis. Arterial bleeding arising from a cecal diverticulum status post hemostasis clip placement and injection therapy . Site of prior rectal mucosal tattooing identified with no evidence of recurrent carcinoid tumor.   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 Oneida Alar: SML IH, RECTAL CARCINOID, HYPEPRPLASTIC POLYPS(2), Moderate TICS/Small lipoma IN HF   COLONOSCOPY N/A 03/15/2015   Dr. Gala Romney: Pancolonic diverticulosis. Arterial bleeding arising from a cecal diverticulum status post hemostasis clip placement and injection therapy . Stie of prior rectal mucosal tattooing identified with no evidence of recurrent carcinoid tumor. No EGD done today,.   ESOPHAGOGASTRODUODENOSCOPY  11/01/2012   Dr. Fields:Schatzki ring was found/MILD GASTRITIS/DUODENIIS   EUS  12/11/2012   RECTAL CARCINOID EXCISED-MARGINS NOT CLEAR   FLEXIBLE SIGMOIDOSCOPY  Jan 2016   WITH EUS. Dr. Paulita Fujita. NO residual lesion identified   TONSILLECTOMY      Prior to Admission medications   Medication Sig Start Date End Date Taking? Authorizing Provider  aspirin EC 81 MG tablet Take 1 tablet (81 mg total) by mouth every other day. Resume in 1 week 03/16/15  Yes Kathie Dike, MD  hydrochlorothiazide (HYDRODIURIL) 25 MG tablet Take 25 mg by mouth daily. for high blood pressure 01/17/19  Yes [provider]  levothyroxine (SYNTHROID) 25 MCG tablet TAKE 1 TABLET BY MOUTH ONCE DAILY FOR LOW THYROID 11/20/18  Yes [provider]  losartan (COZAAR) 50 MG tablet Take 50 mg by mouth daily. 09/19/22  Yes [provider]  lovastatin (MEVACOR) 40 MG tablet Take 40 mg by mouth daily. 09/09/22  Yes [provider]  Multiple Vitamin (MULTIVITAMIN WITH MINERALS) TABS Take 1  tablet by mouth every morning.   Yes [provider]  omeprazole (PRILOSEC) 20 MG capsule TAKE 1 CAPSULE BY MOUTH ONCE DAILY BEFORE  BREAKFAST 05/09/18  Yes Annitta Needs, NP  sildenafil (REVATIO) 20 MG tablet Take 20 mg by mouth daily as needed (FOR ERECTILE  DYSFuncation). 04/27/21  Yes [provider]  HYDROcodone-acetaminophen (NORCO) 5-325 MG tablet Take 1-2 tablets by mouth every 6 (six) hours as needed. Patient not taking: Reported on 10/24/2022 07/22/21   Veryl Speak, MD    Current Facility-Administered Medications  Medication Dose Route Frequency Provider Last Rate Last Admin   0.9 %  sodium chloride infusion   Intravenous Continuous Davonna Belling, MD 125 mL/hr at 10/24/22 1341 New Bag at 10/24/22 1341   Current Outpatient Medications  Medication Sig Dispense Refill   aspirin EC 81 MG tablet Take 1 tablet (81 mg total) by mouth every other day. Resume in 1 week     hydrochlorothiazide (HYDRODIURIL) 25 MG tablet Take 25 mg by mouth daily. for high blood pressure     levothyroxine (SYNTHROID) 25 MCG tablet TAKE 1 TABLET BY MOUTH ONCE DAILY FOR LOW THYROID     losartan (COZAAR) 50 MG tablet Take 50 mg by mouth daily.     lovastatin (MEVACOR) 40 MG tablet Take 40 mg by mouth daily.     Multiple Vitamin (MULTIVITAMIN WITH MINERALS) TABS Take 1 tablet by mouth every morning.     omeprazole (PRILOSEC) 20 MG capsule TAKE 1 CAPSULE BY MOUTH ONCE DAILY BEFORE  BREAKFAST 30 capsule 5   sildenafil (REVATIO) 20 MG tablet Take 20 mg by mouth daily as needed (FOR ERECTILE DYSFuncation).     HYDROcodone-acetaminophen (NORCO) 5-325 MG tablet Take 1-2 tablets by mouth every 6 (six) hours as needed. (Patient not taking: Reported on 10/24/2022) 10 tablet 0    Allergies as of 10/24/2022   (No Known Allergies)    Family History  Problem Relation Age of Onset   Cancer Mother    Cancer Maternal Uncle    Colon cancer Other        maternal great uncle   Lung cancer Other        uncles    Social History   Socioeconomic History   Marital status: Divorced    Spouse name: Not on file   Number of children: 5   Years of education: Not on file   Highest education level: Not on file  Occupational History   Occupation: unemployed  Tobacco Use    Smoking status: Former    Packs/day: 0.50    Years: 10.00    Total pack years: 5.00    Types: Cigarettes    Quit date: 11/01/1981    Years since quitting: 41.0   Smokeless tobacco: Never   Tobacco comments:    quit 1999  Substance and Sexual Activity   Alcohol use: Yes    Comment: 1-2 drinks a month   Drug use: No   Sexual activity: Yes    Birth control/protection: None  Other Topics Concern   Not on file  Social History Narrative   Not on file   Social Determinants of Health   Financial Resource Strain: Not on file  Food Insecurity: Not on file  Transportation Needs: Not on file  Physical Activity: Not on file  Stress: Not on file  Social Connections: Not on file  Intimate Partner Violence: Not on file     Review of Systems   Gen: Denies any fever,  chills, loss of appetite, change in weight or weight loss CV: Denies chest pain, heart palpitations, syncope, edema  Resp: Denies shortness of breath with rest, cough, wheezing, coughing up blood, and pleurisy. GI: see HPI GU : Denies urinary burning, blood in urine, urinary frequency, and urinary incontinence. MS: Denies joint pain, limitation of movement, swelling, cramps, and atrophy.  Derm: Denies rash, itching, dry skin, hives. Psych: Denies depression, anxiety, memory loss, hallucinations, and confusion. Heme: Denies bruising or bleeding Neuro:  Denies any headaches, dizziness, paresthesias, shaking  Physical Exam   Vital Signs in last 24 hours: Temp:  [98.3 F (36.8 C)] 98.3 F (36.8 C) (12/05 1037) Pulse Rate:  [87-91] 87 (12/05 1103) Resp:  [17] 17 (12/05 1037) BP: (122-159)/(67-68) 122/67 (12/05 1103) SpO2:  [96 %-100 %] 96 % (12/05 1103)    General:   Alert,  Well-developed, well-nourished, pleasant and cooperative in NAD Head:  Normocephalic and atraumatic. Eyes:  Sclera clear, no icterus.   Conjunctiva pink. Ears:  Normal auditory acuity. Mouth:  No deformity or lesions, dentition  normal. Neck:  Supple; no masses Lungs:  Clear throughout to auscultation.    Heart:  S1 S2 present without murmurs  Abdomen:  Soft, nontender and nondistended. No masses, hepatosplenomegaly. Umbilical hernia easily reducible.  Normal bowel sounds, without guarding, and without rebound.   Rectal: deferred   Msk:  Symmetrical without gross deformities. Normal posture. Extremities:  Without edema. Neurologic:  Alert and  oriented x4. Skin:  Intact without significant lesions or rashes. Psych:  Alert and cooperative. Normal mood and affect.  Intake/Output from previous day: No intake/output data recorded. Intake/Output this shift: No intake/output data recorded.   Labs/Studies   Recent Labs Recent Labs    10/21/22 2017 10/24/22 1155  WBC 12.2* 10.0  HGB 8.9* 7.6*  HCT 27.6* 23.3*  PLT 196 199   BMET Recent Labs    10/21/22 2017 10/24/22 1155  NA 137 138  K 3.1* 3.3*  CL 103 102  CO2 25 29  GLUCOSE 198* 116*  BUN 16 9  CREATININE 1.10 0.89  CALCIUM 8.5* 8.7*   LFT Recent Labs    10/24/22 1155  PROT 6.3*  ALBUMIN 3.5  AST 27  ALT 27  ALKPHOS 67  BILITOT 0.6   PT/INR Recent Labs    10/24/22 1155  LABPROT 12.0  INR 0.9     Radiology/Studies CT ANGIO GI BLEED  Result Date: 10/24/2022 CLINICAL DATA:  Rectal bleeding with decreased appetite. EXAM: CTA ABDOMEN AND PELVIS WITHOUT AND WITH CONTRAST TECHNIQUE: Multidetector CT imaging of the abdomen and pelvis was performed using the standard protocol during bolus administration of intravenous contrast. Multiplanar reconstructed images and MIPs were obtained and reviewed to evaluate the vascular anatomy. RADIATION DOSE REDUCTION: This exam was performed according to the departmental dose-optimization program which includes automated exposure control, adjustment of the mA and/or kV according to patient size and/or use of iterative reconstruction technique. CONTRAST:  21m OMNIPAQUE IOHEXOL 350 MG/ML SOLN  COMPARISON:  08/26/2013 FINDINGS: VASCULAR Aorta: Normal caliber aorta without aneurysm, dissection, vasculitis or significant stenosis. Atherosclerotic calcification evident. Celiac: Patent without evidence of aneurysm, dissection, vasculitis or significant stenosis. SMA: Patent without evidence of aneurysm, dissection, vasculitis or significant stenosis. Renals: Both renal arteries are patent without evidence of aneurysm, dissection, vasculitis, fibromuscular dysplasia or significant stenosis. IMA: Patent without evidence of aneurysm, dissection, vasculitis or significant stenosis. Inflow: Patent without evidence of aneurysm, dissection, vasculitis or significant stenosis. Proximal Outflow: Bilateral common femoral and  visualized portions of the superficial and profunda femoral arteries are patent without evidence of aneurysm, dissection, vasculitis or significant stenosis. Veins: No obvious venous abnormality within the limitations of this arterial phase study. Review of the MIP images confirms the above findings. NON-VASCULAR Lower chest: Unremarkable Hepatobiliary: No suspicious focal abnormality within the liver parenchyma. Gallbladder is distended with numerous noncalcified gallstones evident. No intrahepatic or extrahepatic biliary dilation. Pancreas: No focal mass lesion. No dilatation of the main duct. No intraparenchymal cyst. No peripancreatic edema. Spleen: No splenomegaly. No focal mass lesion. Adrenals/Urinary Tract: No adrenal nodule or mass. Kidneys unremarkable. No evidence for hydroureter. The urinary bladder appears normal for the degree of distention. Stomach/Bowel: Stomach is unremarkable. No gastric wall thickening. No evidence of outlet obstruction. Duodenum is normally positioned as is the ligament of Treitz. No small bowel wall thickening. No small bowel dilatation. The terminal ileum is normal. The wall the cecum is ill-defined in there is subtle pericecal edema (see axial 150/11) in the  region of the ileocecal valve. Multiple colonic diverticuli evident including in the cecum/right colon. No evidence for arterial phase contrast extravasation in the stomach, small bowel, or colon to suggest active arterial GI hemorrhage. Lymphatic: Upper normal lymph nodes identified in the hepatoduodenal ligament. No retroperitoneal or mesenteric lymphadenopathy. No pelvic sidewall lymphadenopathy. Reproductive: Prostate gland is enlarged. Other: No intraperitoneal free fluid. Musculoskeletal: No worrisome lytic or sclerotic osseous abnormality. IMPRESSION: 1. No evidence for arterial phase contrast extravasation in the stomach, small bowel, or colon to suggest active arterial GI hemorrhage. 2. The wall of the cecum is ill-defined and there is subtle pericecal edema in the region of the ileocecal valve. Imaging features are nonspecific and may be related to infectious/inflammatory colitis. Given the numerous right-sided colonic diverticuli, diverticulitis would also be a consideration. 3. Cholelithiasis. 4. Prostatomegaly. Electronically Signed   By: Misty Stanley M.D.   On: 10/24/2022 14:19     Assessment   Tim Cunningham is a 71 y.o. year old male with a history of rectal carcinoid tumor s/p transanal resection by Dr. Paulita Fujita in Jan 2014, diverticular bleed in 2016 s/p clip placement and epi, presenting over the weekend to the ED with rectal bleeding and Hgb 8.9, down from 12.8 earlier this year. Presenting again today at the request of PCP due to declining Hgb and rectal bleeding.    Rectal bleeding: notes onset of low-volume hematochezia with every BM prior to Thanksgiving. No abdominal pain. He does endorse Aleve routinely for back pain and took several Goody powders over the weekend. No anticoagulation. Desires colonoscopy. He does have a drop in Hgb from baseline. Hemodynamically stable. Last colonoscopy in 2016 due to diverticular bleed.   CTA with wall of cecum ill-defined and subtle pericecal  edema in region of the IC valve non-specific. Numerous right-sided diverticuli. He clinically is not presenting with diverticulitis.    Plan / Recommendations    Golytely today May have clear liquids, NPO after midnight PPI BID Colonoscopy tomorrow. Risks and benefits discussed with patient and wife at bedside with stated understanding. Avoid NSAIDs/aspirin powders       10/24/2022, 2:40 PM  Annitta Needs, PhD, [facility]

## 2022-10-25 ENCOUNTER — Inpatient Hospital Stay (HOSPITAL_COMMUNITY): Payer: Medicare HMO | Admitting: Anesthesiology

## 2022-10-25 ENCOUNTER — Encounter (HOSPITAL_COMMUNITY)
Admission: EM | Disposition: A | Discharge status: Home / Self Care | Payer: Self-pay | Source: Home / Self Care | Attending: Emergency Medicine

## 2022-10-25 ENCOUNTER — Ambulatory Visit: Payer: Medicare HMO | Admitting: Gastroenterology

## 2022-10-25 ENCOUNTER — Encounter (HOSPITAL_COMMUNITY): Payer: Self-pay | Admitting: Internal Medicine

## 2022-10-25 DIAGNOSIS — K633 Ulcer of intestine: Secondary | ICD-10-CM

## 2022-10-25 DIAGNOSIS — K5731 Diverticulosis of large intestine without perforation or abscess with bleeding: Secondary | ICD-10-CM | POA: Diagnosis not present

## 2022-10-25 DIAGNOSIS — D125 Benign neoplasm of sigmoid colon: Secondary | ICD-10-CM | POA: Diagnosis not present

## 2022-10-25 DIAGNOSIS — E039 Hypothyroidism, unspecified: Secondary | ICD-10-CM

## 2022-10-25 DIAGNOSIS — Z86010 Personal history of colonic polyps: Secondary | ICD-10-CM

## 2022-10-25 DIAGNOSIS — E876 Hypokalemia: Secondary | ICD-10-CM

## 2022-10-25 DIAGNOSIS — Z87891 Personal history of nicotine dependence: Secondary | ICD-10-CM | POA: Diagnosis not present

## 2022-10-25 DIAGNOSIS — I10 Essential (primary) hypertension: Secondary | ICD-10-CM | POA: Diagnosis not present

## 2022-10-25 DIAGNOSIS — D3A026 Benign carcinoid tumor of the rectum: Secondary | ICD-10-CM

## 2022-10-25 DIAGNOSIS — K922 Gastrointestinal hemorrhage, unspecified: Secondary | ICD-10-CM | POA: Diagnosis not present

## 2022-10-25 HISTORY — PX: POLYPECTOMY: SHX5525

## 2022-10-25 HISTORY — PX: COLONOSCOPY WITH PROPOFOL: SHX5780

## 2022-10-25 HISTORY — PX: BIOPSY: SHX5522

## 2022-10-25 LAB — BASIC METABOLIC PANEL
Anion gap: 5 (ref 5–15)
BUN: 7 mg/dL — ABNORMAL LOW (ref 8–23)
CO2: 27 mmol/L (ref 22–32)
Calcium: 8.3 mg/dL — ABNORMAL LOW (ref 8.9–10.3)
Chloride: 106 mmol/L (ref 98–111)
Creatinine, Ser: 0.89 mg/dL (ref 0.61–1.24)
GFR, Estimated: >60 mL/min (ref >60)
Glucose, Bld: 107 mg/dL — ABNORMAL HIGH (ref 70–99)
Potassium: 3.6 mmol/L (ref 3.5–5.1)
Sodium: 138 mmol/L (ref 135–145)

## 2022-10-25 LAB — CBC
HCT: 24.7 % — ABNORMAL LOW (ref 39.0–52.0)
Hemoglobin: 8 g/dL — ABNORMAL LOW (ref 13.0–17.0)
MCH: 29 pg (ref 26.0–34.0)
MCHC: 32.4 g/dL (ref 30.0–36.0)
MCV: 89.5 fL (ref 80.0–100.0)
Platelets: 186 10*3/uL (ref 150–400)
RBC: 2.76 MIL/uL — ABNORMAL LOW (ref 4.22–5.81)
RDW: 14.5 % (ref 11.5–15.5)
WBC: 8.1 10*3/uL (ref 4.0–10.5)
nRBC: 0 % (ref 0.0–0.2)

## 2022-10-25 LAB — BPAM RBC
Blood Product Expiration Date: 202401062359
ISSUE DATE / TIME: 202312052130
Unit Type and Rh: 5100

## 2022-10-25 LAB — TYPE AND SCREEN
ABO/RH(D): O POS
Antibody Screen: NEGATIVE
Unit division: 0

## 2022-10-25 LAB — MAGNESIUM: Magnesium: 2 mg/dL (ref 1.7–2.4)

## 2022-10-25 SURGERY — COLONOSCOPY WITH PROPOFOL
Anesthesia: General

## 2022-10-25 MED ORDER — SODIUM CHLORIDE 0.9 % IV SOLN
12.5000 mg | Freq: Once | INTRAVENOUS | Status: AC
  Administered 2022-10-25: 12.5 mg via INTRAVENOUS
  Filled 2022-10-25: qty 0.5

## 2022-10-25 MED ORDER — PROMETHAZINE HCL 25 MG/ML IJ SOLN
INTRAMUSCULAR | Status: AC
  Filled 2022-10-25: qty 1

## 2022-10-25 MED ORDER — GLUCAGON HCL RDNA (DIAGNOSTIC) 1 MG IJ SOLR
INTRAMUSCULAR | Status: DC | PRN
  Administered 2022-10-25: 1 mg via INTRAVENOUS

## 2022-10-25 MED ORDER — STERILE WATER FOR IRRIGATION IR SOLN
Status: DC | PRN
  Administered 2022-10-25: 180 mL

## 2022-10-25 MED ORDER — SODIUM CHLORIDE 0.9 % IV SOLN: INTRAVENOUS | Status: DC

## 2022-10-25 MED ORDER — MORPHINE SULFATE (PF) 2 MG/ML IV SOLN
2.0000 mg | Freq: Once | INTRAVENOUS | Status: AC
  Administered 2022-10-25: 2 mg via INTRAVENOUS
  Filled 2022-10-25: qty 1

## 2022-10-25 MED ORDER — GLUCAGON HCL RDNA (DIAGNOSTIC) 1 MG IJ SOLR
INTRAMUSCULAR | Status: AC
  Filled 2022-10-25: qty 1

## 2022-10-25 MED ORDER — PROPOFOL 500 MG/50ML IV EMUL
INTRAVENOUS | Status: DC | PRN
  Administered 2022-10-25: 150 ug/kg/min via INTRAVENOUS

## 2022-10-25 MED ORDER — PROPOFOL 10 MG/ML IV BOLUS
INTRAVENOUS | Status: DC | PRN
  Administered 2022-10-25: 60 mg via INTRAVENOUS

## 2022-10-25 MED ORDER — LACTATED RINGERS IV SOLN: INTRAVENOUS | Status: DC

## 2022-10-25 NOTE — Anesthesia Postprocedure Evaluation (Signed)
Anesthesia Post Note  Patient: Tim Cunningham  Procedure(s) Performed: COLONOSCOPY WITH PROPOFOL BIOPSY POLYPECTOMY  Patient location during evaluation: PACU Anesthesia Type: General Level of consciousness: awake and alert and oriented Pain management: pain level controlled Vital Signs Assessment: post-procedure vital signs reviewed and stable Respiratory status: spontaneous breathing, nonlabored ventilation and respiratory function stable Cardiovascular status: blood pressure returned to baseline and stable Postop Assessment: no apparent nausea or vomiting Anesthetic complications: no  No notable events documented.   Last Vitals:  Vitals:   10/25/22 1345 10/25/22 1352  BP: 110/68   Pulse: 75   Resp: 17   Temp:    SpO2: 96% 98%    Last Pain:  Vitals:   10/25/22 1345  TempSrc:   PainSc: Asleep                 Earlyn Sylvan C Tytionna Cloyd

## 2022-10-25 NOTE — Interval H&P Note (Signed)
History and Physical Interval Note:  10/25/2022 12:54 PM  Tim Cunningham  has presented today for surgery, with the diagnosis of rectal bleeding, anemia.  The various methods of treatment have been discussed with the patient and family. After consideration of risks, benefits and other options for treatment, the patient has consented to  Procedure(s): COLONOSCOPY WITH PROPOFOL (N/A) as a surgical intervention.  The patient's history has been reviewed, patient examined, no change in status, stable for surgery.  I have reviewed the patient's chart and labs.  Questions were answered to the patient's satisfaction.     [name]    Patient seen and examined.  Hemoglobin 8.0 this a.m.  Remains hemodynamically stable.  Agree with need for diagnostic colonoscopy.  The risks, benefits, limitations, alternatives and imponderables have been reviewed with the patient. Questions have been answered. All parties are agreeable.

## 2022-10-25 NOTE — Anesthesia Preprocedure Evaluation (Signed)
Anesthesia Evaluation  Patient identified by MRN, date of birth, ID band Patient awake    Reviewed: Allergy & Precautions, H&P , NPO status , Patient's Chart, lab work & pertinent test results  Airway Mallampati: II  TM Distance: >3 FB Neck ROM: Full    Dental  (+) Dental Advisory Given, Chipped   Pulmonary neg pulmonary ROS, Patient abstained from smoking., former smoker   Pulmonary exam normal breath sounds clear to auscultation       Cardiovascular negative cardio ROS Normal cardiovascular exam Rhythm:Regular Rate:Normal     Neuro/Psych negative neurological ROS  negative psych ROS   GI/Hepatic Neg liver ROS, Bowel prep,GERD  Medicated and Controlled,,Rectal carcinoid tumor    Endo/Other  negative endocrine ROS    Renal/GU negative Renal ROS  negative genitourinary   Musculoskeletal negative musculoskeletal ROS (+)    Abdominal   Peds negative pediatric ROS (+)  Hematology  (+) Blood dyscrasia, anemia   Anesthesia Other Findings   Reproductive/Obstetrics negative OB ROS                             Anesthesia Physical Anesthesia Plan  ASA: 3  Anesthesia Plan: General   Post-op Pain Management: Minimal or no pain anticipated   Induction: Intravenous  PONV Risk Score and Plan: Propofol infusion  Airway Management Planned: Nasal Cannula and Natural Airway  Additional Equipment:   Intra-op Plan:   Post-operative Plan:   Informed Consent: I have reviewed the patients History and Physical, chart, labs and discussed the procedure including the risks, benefits and alternatives for the proposed anesthesia with the patient or authorized representative who has indicated his/her understanding and acceptance.     Dental advisory given  Plan Discussed with: CRNA and Surgeon  Anesthesia Plan Comments:         Anesthesia Quick Evaluation

## 2022-10-25 NOTE — Progress Notes (Signed)
In to patient's room to check tele monitor leads and found pt sitting on side of bed. Pt's wife states pt very restless and having abd pain. When questioned, pt states he has pressure and bloating feeling in his upper abd and feels nauseated. Pt unable to lie down due to pressure and nausea. Pt then began vomiting, clear green emesis, 120 ml total. Once he stopped vomiting, he stated bloating and pressure relieved but still feels nauseated. Vital signs checked, systolic b/p elevated 979'Y, other VSS. Skin warm and dry, color appropriate. Lungs clear to auscultation, HR with RRR, abd distended but soft with active bowel sounds x4. Zofran IV admin'd for relief of nausea. Pt states he thinks he is having this issue because of his hiatal hernia and that he is so hungry. Advised pt order is for clear liquids and if tolerated can be advanced to full liquids. Pt and wife state understanding. Awaiting diet trays to be delivered. Advised pt to call if any further abd pain or n/v. Both he and wife state understanding.

## 2022-10-25 NOTE — Progress Notes (Signed)
PROGRESS NOTE   YONAEL TULLOCH  LTJ:[id number] DOB: [birth year] DOA: 10/24/2022 PCP: Leslie Andrea, MD   Chief Complaint  Patient presents with   Rectal Bleeding   Level of care: Telemetry  Brief Admission History:   71 y.o. male with a history of diverticular bleed, carcinoma tumor resection, Hypothyroidism, HTN, HLD, and R knee arthritis who presented to the ED on 10/24/22 with blood in the stool. He was in the ED on 10/21/22 for hypertension and reddish stool. He was found to be anemia, but was released without further workup and told to follow with his PCP. PCP found he was still anemic and still reported red stools, so he was sent back to the ED today for further workup. Last blood stool was yesterday. He is being admitted for GI bleed.   ED Course: CMP showed K+ 3.3, Glucose 116, Ca8.7 with normal (3.5) albumin, total protein of 6.3 CBC showed hgb 7.6 FOBT ordered and is pending. Abdominal CT showed No active arterial GI bleed, possible colitis or diverticulitis, cholelithiasis, and prostatomegaly. PTT 12.0, INR 0.9 Type and screen completed in ED.   Assessment and Plan: Rectal Bleeding - GI service planning upper endoscopy on 12/6 - further recommendations to follow pending work up - repeat CBC in AM  - continue supportive measures  - continue protonix for now  Hypothyroidism  - resumed home dose levothyroxine   Hypokalemia - repleted   Normocytic Anemia - recheck CBC in AM   Essential hypertension  - resumed home meds; following   DVT prophylaxis: SCDs Code Status: full  Family Communication:  Disposition: Status is: Inpatient Remains inpatient appropriate because: intensity    Consultants:  GI service  Procedures:  EGD 12/6 pending  Antimicrobials:    Subjective: Pt agreeable to endoscopy today.  No specific complaints other than wanting to eat/drink.    Objective: Vitals:   10/24/22 2205 10/25/22 0000 10/25/22 0333 10/25/22 1103  BP: 126/63 (!) 120/57  119/62   Pulse: 71 (!) 57 67 60  Resp: '18 18 18 18  '$ Temp: 98.2 F (36.8 C) 98.6 F (37 C) 97.8 F (36.6 C) 98.4 F (36.9 C)  TempSrc: Axillary Oral  Oral  SpO2:  99% 100% 100%  Weight:    90.7 kg  Height:    '5\' 6"'$  (1.676 m)    Intake/Output Summary (Last 24 hours) at 10/25/2022 1345 Last data filed at 10/25/2022 1334 Gross per 24 hour  Intake 1531.02 ml  Output --  Net 1531.02 ml   Filed Weights   10/25/22 1103  Weight: 90.7 kg   Examination:  General exam: Appears calm and comfortable  Respiratory system: Clear to auscultation. Respiratory effort normal. Cardiovascular system: normal S1 & S2 heard. No JVD, murmurs, rubs, gallops or clicks. No pedal edema. Gastrointestinal system: Abdomen is nondistended, soft and nontender. No organomegaly or masses felt. Normal bowel sounds heard. Central nervous system: Alert and oriented. No focal neurological deficits. Extremities: Symmetric 5 x 5 power. Skin: No rashes, lesions or ulcers. Psychiatry: Judgement and insight appear normal. Mood & affect appropriate.   Data Reviewed: I have personally reviewed following labs and imaging studies  CBC: Recent Labs  Lab 10/21/22 2017 10/24/22 1155 10/25/22 0553  WBC 12.2* 10.0 8.1  NEUTROABS  --  7.5  --   HGB 8.9* 7.6* 8.0*  HCT 27.6* 23.3* 24.7*  MCV 89.6 91.7 89.5  PLT 196 199 076    Basic Metabolic Panel: Recent Labs  Lab 10/21/22 2017 10/24/22 1155  10/25/22 0553  NA 137 138 138  K 3.1* 3.3* 3.6  CL 103 102 106  CO2 '25 29 27  '$ GLUCOSE 198* 116* 107*  BUN 16 9 7*  CREATININE 1.10 0.89 0.89  CALCIUM 8.5* 8.7* 8.3*  MG  --   --  2.0    CBG: No results for input(s): "GLUCAP" in the last 168 hours.  No results found for this or any previous visit (from the past 240 hour(s)).   Radiology Studies: CT ANGIO GI BLEED  Result Date: 10/24/2022 CLINICAL DATA:  Rectal bleeding with decreased appetite. EXAM: CTA ABDOMEN AND PELVIS WITHOUT AND WITH CONTRAST TECHNIQUE:  Multidetector CT imaging of the abdomen and pelvis was performed using the standard protocol during bolus administration of intravenous contrast. Multiplanar reconstructed images and MIPs were obtained and reviewed to evaluate the vascular anatomy. RADIATION DOSE REDUCTION: This exam was performed according to the departmental dose-optimization program which includes automated exposure control, adjustment of the mA and/or kV according to patient size and/or use of iterative reconstruction technique. CONTRAST:  47m OMNIPAQUE IOHEXOL 350 MG/ML SOLN COMPARISON:  08/26/2013 FINDINGS: VASCULAR Aorta: Normal caliber aorta without aneurysm, dissection, vasculitis or significant stenosis. Atherosclerotic calcification evident. Celiac: Patent without evidence of aneurysm, dissection, vasculitis or significant stenosis. SMA: Patent without evidence of aneurysm, dissection, vasculitis or significant stenosis. Renals: Both renal arteries are patent without evidence of aneurysm, dissection, vasculitis, fibromuscular dysplasia or significant stenosis. IMA: Patent without evidence of aneurysm, dissection, vasculitis or significant stenosis. Inflow: Patent without evidence of aneurysm, dissection, vasculitis or significant stenosis. Proximal Outflow: Bilateral common femoral and visualized portions of the superficial and profunda femoral arteries are patent without evidence of aneurysm, dissection, vasculitis or significant stenosis. Veins: No obvious venous abnormality within the limitations of this arterial phase study. Review of the MIP images confirms the above findings. NON-VASCULAR Lower chest: Unremarkable Hepatobiliary: No suspicious focal abnormality within the liver parenchyma. Gallbladder is distended with numerous noncalcified gallstones evident. No intrahepatic or extrahepatic biliary dilation. Pancreas: No focal mass lesion. No dilatation of the main duct. No intraparenchymal cyst. No peripancreatic edema. Spleen: No  splenomegaly. No focal mass lesion. Adrenals/Urinary Tract: No adrenal nodule or mass. Kidneys unremarkable. No evidence for hydroureter. The urinary bladder appears normal for the degree of distention. Stomach/Bowel: Stomach is unremarkable. No gastric wall thickening. No evidence of outlet obstruction. Duodenum is normally positioned as is the ligament of Treitz. No small bowel wall thickening. No small bowel dilatation. The terminal ileum is normal. The wall the cecum is ill-defined in there is subtle pericecal edema (see axial 150/11) in the region of the ileocecal valve. Multiple colonic diverticuli evident including in the cecum/right colon. No evidence for arterial phase contrast extravasation in the stomach, small bowel, or colon to suggest active arterial GI hemorrhage. Lymphatic: Upper normal lymph nodes identified in the hepatoduodenal ligament. No retroperitoneal or mesenteric lymphadenopathy. No pelvic sidewall lymphadenopathy. Reproductive: Prostate gland is enlarged. Other: No intraperitoneal free fluid. Musculoskeletal: No worrisome lytic or sclerotic osseous abnormality. IMPRESSION: 1. No evidence for arterial phase contrast extravasation in the stomach, small bowel, or colon to suggest active arterial GI hemorrhage. 2. The wall of the cecum is ill-defined and there is subtle pericecal edema in the region of the ileocecal valve. Imaging features are nonspecific and may be related to infectious/inflammatory colitis. Given the numerous right-sided colonic diverticuli, diverticulitis would also be a consideration. 3. Cholelithiasis. 4. Prostatomegaly. Electronically Signed   By: EMisty StanleyM.D.   On: 10/24/2022 14:19  Scheduled Meds:  [MAR Hold] sodium chloride   Intravenous Once   [MAR Hold] levothyroxine  25 mcg Oral Q0600   [MAR Hold] pantoprazole (PROTONIX) IV  40 mg Intravenous Q12H   Continuous Infusions:  sodium chloride 75 mL/hr at 10/25/22 0525   sodium chloride     lactated  ringers Stopped (10/25/22 1334)     LOS: 1 day   Time spent: 35 mins  Leslieanne Cobarrubias Wynetta Emery, MD How to contact the [facility] Attending or Consulting provider [place] or covering provider during after hours [place], for this patient?  Check the care team in [facility] and look for a) attending/consulting TRH provider listed and b) the [facility] team listed Log into [email or web address] and use [place]'s universal password to access. If you do not have the password, please contact the hospital operator. Locate the [facility] provider you are looking for under [facility] and page to a number that you can be directly reached. If you still have difficulty reaching the provider, please page the [facility] (Director on Call) for the Hospitalists listed on amion for assistance.  10/25/2022, 1:45 PM

## 2022-10-25 NOTE — Hospital Course (Signed)
71 y.o. male with a history of diverticular bleed, carcinoma tumor resection, Hypothyroidism, HTN, HLD, and R knee arthritis who presented to the ED on 10/24/22 with blood in the stool. He was in the ED on 10/21/22 for hypertension and reddish stool. He was found to be anemia, but was released without further workup and told to follow with his PCP. PCP found he was still anemic and still reported red stools, so he was sent back to the ED today for further workup. Last blood stool was yesterday. He is being admitted for GI bleed.   ED Course: CMP showed K+ 3.3, Glucose 116, Ca8.7 with normal (3.5) albumin, total protein of 6.3 CBC showed hgb 7.6 FOBT ordered and is pending. Abdominal CT showed No active arterial GI bleed, possible colitis or diverticulitis, cholelithiasis, and prostatomegaly. PTT 12.0, INR 0.9 Type and screen completed in ED.

## 2022-10-25 NOTE — TOC Progression Note (Signed)
Transition of Care [facility]) - Progression Note    Patient Details  Name: Tim Cunningham MRN: [id number] Date of Birth: 1950-12-08  Transition of [facility]) CM/SW Contact  Salome Arnt, [place] Phone Number: 10/25/2022, 10:49 AM  Clinical Narrative:  Transition of Care [facility]) Screening Note   Patient Details  Name: Tim Cunningham Date of Birth: 09/06/1951   Transition of Care [facility]) CM/SW Contact:    Salome Arnt, [place] Phone Number: 10/25/2022, 10:49 AM    Transition of Care Department [facility]) has reviewed patient and no TOC needs have been identified at this time. We will continue to monitor patient advancement through interdisciplinary progression rounds. If new patient transition needs arise, please place a TOC consult.          Barriers to Discharge: Continued Medical Work up  Expected Discharge Plan and Services                                                 Social Determinants of Health (SDOH) Interventions    Readmission Risk Interventions     No data to display

## 2022-10-25 NOTE — Op Note (Signed)
[facility] Patient Name: Tim Cunningham Procedure Date: 10/25/2022 12:28 PM MRN: [id number] Date of Birth: [birth year] Attending MD: Norvel Richards , MD, [id number] CSN: [id number] Age: 71 Admit Type: Outpatient Procedure:                Colonoscopy Indications:              Hematochezia Providers:                Norvel Richards, MD, Lambert Mody,                            Everardo Pacific Referring MD:              Medicines:                Propofol per Anesthesia Complications:            No immediate complications. Estimated Blood Loss:     Estimated blood loss was minimal. Procedure:                Pre-Anesthesia Assessment:                           - Prior to the procedure, a History and Physical                            was performed, and patient medications and                            allergies were reviewed. The patient's tolerance of                            previous anesthesia was also reviewed. The risks                            and benefits of the procedure and the sedation                            options and risks were discussed with the patient.                            All questions were answered, and informed consent                            was obtained. Prior Anticoagulants: The patient has                            taken no anticoagulant or antiplatelet agents. ASA                            Grade Assessment: III - A patient with severe                            systemic disease. After reviewing the risks and                            benefits,  the patient was deemed in satisfactory                            condition to undergo the procedure.                           After obtaining informed consent, the colonoscope                            was passed under direct vision. Throughout the                            procedure, the patient's blood pressure, pulse, and                            oxygen saturations were  monitored continuously. The                            [id number]) scope was introduced through the                            anus and advanced to the the cecum, identified by                            appendiceal orifice and ileocecal valve. The                            colonoscopy was performed without difficulty. The                            patient tolerated the procedure well. The quality                            of the bowel preparation was adequate. The                            ileocecal valve, appendiceal orifice, and rectum                            were photographed. The entire colon was well                            visualized. Scope In: 1:01:23 PM Scope Out: 1:34:40 PM Scope Withdrawal Time: 0 hours 20 minutes 13 seconds  Total Procedure Duration: 0 hours 33 minutes 17 seconds  Findings:      The perianal and digital rectal examinations were normal. No blood in       the colon. Redundant colon requiring external abdominal pressure to       reach the cecum.      Many Large and small diverticula were found in the entire colon.       Clustering of diverticula in the area of the cecum. Extensive       "geographically" ulcerated ileocecal valve. Please see multiple       photographs. The orifice of the ileocecal valve was edematous and  would       not admit the colonoscope although I had good approach. 2 biopsies of       the ileocecal valve ulceration were taken for histologic study. There is       minimal bleeding associated with this maneuver.      (1) 4 mm sessile polyp in the mid sigmoid. It was cold snare removed.      The remainder of the colon and rectum appeared normal aside from       evidence of rectal mucosal tattoo. No evidence of recurrent carcinoid Impression:               - Extensively ulcerated ileocecal valve with                            edematous orifice; would not admit the colonoscope.                           -Pancolonic  diverticulosis. Clustering of                            diverticula in the area of the cecum. Diverticula                            without bleeding stigmata. Diverticulosis in the                            entire examined colon.                           -Ileocecal valve ulcer biopsied.                           -Recent lower GI bleeding likely from the ileocecal                            valve/terminal ileum. This could simply be NSAID                            effect. Cannot rule out underlying inflammatory                            bowel disease at this time. Moderate Sedation:      Moderate (conscious) sedation was personally administered by an       anesthesia professional. The following parameters were monitored: oxygen       saturation, heart rate, blood pressure, respiratory rate, EKG, adequacy       of pulmonary ventilation, and response to care. Recommendation:           - Return patient to hospital ward for ongoing care.                           - Clear liquid diet.                           - Continue present medications. Absolute cessation  of all NSAIDs. Follow-up on pathology.                           - Repeat colonoscopy date to be determined after                            pending pathology results are reviewed for                            surveillance. Future reassessment of the terminal                            ileum and ileocecal valve to be determined.                           -At patient request, I called Bud Face at                            [place] findings and recommendations                            in detail. Procedure Code(s):        --- Professional ---                           [phone], Colonoscopy, flexible; diagnostic, including                            collection of specimen(s) by brushing or washing,                            when performed (separate procedure) Diagnosis Code(s):         --- Professional ---                           K92.1, Melena (includes Hematochezia)                           K57.30, Diverticulosis of large intestine without                            perforation or abscess without bleeding CPT copyright 2022 American Medical Association. All rights reserved. The codes documented in this report are preliminary and upon coder review may  be revised to meet current compliance requirements. Cristopher Estimable. Kathlean Cinco, MD Norvel Richards, MD 10/25/2022 2:07:25 PM This report has been signed electronically. Number of Addenda: 0

## 2022-10-25 NOTE — Transfer of Care (Signed)
Immediate Anesthesia Transfer of Care Note  Patient: Tim Cunningham  Procedure(s) Performed: COLONOSCOPY WITH PROPOFOL BIOPSY POLYPECTOMY  Patient Location: PACU  Anesthesia Type:General  Level of Consciousness: awake, alert , and oriented  Airway & Oxygen Therapy: Patient Spontanous Breathing  Post-op Assessment: Report given to RN, Post -op Vital signs reviewed and stable, Patient moving all extremities X 4, and Patient able to stick tongue midline  Post vital signs: Reviewed  Last Vitals:  Vitals Value Taken Time  BP 108/65   Temp 97.6   Pulse 80   Resp 16   SpO2 94     Last Pain:  Vitals:   10/25/22 1302  TempSrc:   PainSc: 0-No pain         Complications: No notable events documented.

## 2022-10-26 ENCOUNTER — Telehealth: Payer: Self-pay | Admitting: Gastroenterology

## 2022-10-26 DIAGNOSIS — K922 Gastrointestinal hemorrhage, unspecified: Principal | ICD-10-CM

## 2022-10-26 DIAGNOSIS — E876 Hypokalemia: Secondary | ICD-10-CM | POA: Diagnosis not present

## 2022-10-26 DIAGNOSIS — E039 Hypothyroidism, unspecified: Secondary | ICD-10-CM | POA: Diagnosis not present

## 2022-10-26 DIAGNOSIS — I10 Essential (primary) hypertension: Secondary | ICD-10-CM | POA: Diagnosis not present

## 2022-10-26 LAB — BASIC METABOLIC PANEL
Anion gap: 5 (ref 5–15)
BUN: 6 mg/dL — ABNORMAL LOW (ref 8–23)
CO2: 26 mmol/L (ref 22–32)
Calcium: 8.3 mg/dL — ABNORMAL LOW (ref 8.9–10.3)
Chloride: 108 mmol/L (ref 98–111)
Creatinine, Ser: 0.9 mg/dL (ref 0.61–1.24)
GFR, Estimated: >60 mL/min (ref >60)
Glucose, Bld: 91 mg/dL (ref 70–99)
Potassium: 3.4 mmol/L — ABNORMAL LOW (ref 3.5–5.1)
Sodium: 139 mmol/L (ref 135–145)

## 2022-10-26 LAB — CBC
HCT: 25.4 % — ABNORMAL LOW (ref 39.0–52.0)
Hemoglobin: 8.2 g/dL — ABNORMAL LOW (ref 13.0–17.0)
MCH: 29.2 pg (ref 26.0–34.0)
MCHC: 32.3 g/dL (ref 30.0–36.0)
MCV: 90.4 fL (ref 80.0–100.0)
Platelets: 205 10*3/uL (ref 150–400)
RBC: 2.81 MIL/uL — ABNORMAL LOW (ref 4.22–5.81)
RDW: 14.7 % (ref 11.5–15.5)
WBC: 9.3 10*3/uL (ref 4.0–10.5)
nRBC: 0.3 % — ABNORMAL HIGH (ref 0.0–0.2)

## 2022-10-26 LAB — MAGNESIUM: Magnesium: 2 mg/dL (ref 1.7–2.4)

## 2022-10-26 MED ORDER — POTASSIUM CHLORIDE CRYS ER 20 MEQ PO TBCR
30.0000 meq | EXTENDED_RELEASE_TABLET | Freq: Once | ORAL | Status: AC
  Administered 2022-10-26: 30 meq via ORAL
  Filled 2022-10-26: qty 1

## 2022-10-26 NOTE — Progress Notes (Signed)
PROGRESS NOTE   Tim Cunningham  ZOX:[id number] DOB: February 13, 1951 DOA: 10/24/2022 PCP: Leslie Andrea, MD   Chief Complaint  Patient presents with   Rectal Bleeding   Level of care: Med-Surg  Brief Admission History:   71 y.o. male with a history of diverticular bleed, carcinoma tumor resection, Hypothyroidism, HTN, HLD, and R knee arthritis who presented to the ED on 10/24/22 with blood in the stool. He was in the ED on 10/21/22 for hypertension and reddish stool. He was found to be anemia, but was released without further workup and told to follow with his PCP. PCP found he was still anemic and still reported red stools, so he was sent back to the ED today for further workup. Last blood stool was yesterday. He is being admitted for GI bleed.   ED Course: CMP showed K+ 3.3, Glucose 116, Ca8.7 with normal (3.5) albumin, total protein of 6.3 CBC showed hgb 7.6 FOBT ordered and is pending. Abdominal CT showed No active arterial GI bleed, possible colitis or diverticulitis, cholelithiasis, and prostatomegaly. PTT 12.0, INR 0.9 Type and screen completed in ED.   Assessment and Plan: Rectal Bleeding - GI service s/p upper endoscopy on 12/6 - further recommendations to follow pending work up - repeat CBC in AM  - continue supportive measures  - continue protonix for now - EGD: Extensively ulcerated ileocecal valve with edematous orifice; would not admit the colonoscope.   -Pancolonic diverticulosis. Clustering of diverticula in the area of the cecum. Diverticula without bleeding stigmata. Diverticulosis in the entire examined colon. -Ileocecal valve ulcer biopsied. -Recent lower GI bleeding likely from the ileocecal valve/terminal ileum. This could simply be NSAID effect. Cannot rule out underlying inflammatory bowel disease at this time.  Hypothyroidism  - resumed home dose levothyroxine   Hypokalemia - repleted   Normocytic Anemia - recheck CBC in AM   Essential hypertension  -  resumed home meds; following   DVT prophylaxis: SCDs Code Status: full  Family Communication:  Disposition: Status is: Inpatient Remains inpatient appropriate because: intensity    Consultants:  GI service  Procedures:  EGD 12/6  Antimicrobials:    Subjective: Pt wanting to advance his diet today. No other complaints.      Objective: Vitals:   10/25/22 2044 10/25/22 2105 10/26/22 0423 10/26/22 1411  BP: (!) 148/68 (!) 147/67 116/63 (!) 150/66  Pulse: 79 72 73 68  Resp: '20 18 20   '$ Temp: 98.2 F (36.8 C) 97.7 F (36.5 C) 98.5 F (36.9 C) 98 F (36.7 C)  TempSrc: Oral Oral Oral   SpO2: 97% 100% 98% 100%  Weight:      Height:        Intake/Output Summary (Last 24 hours) at 10/26/2022 1719 Last data filed at 10/26/2022 0900 Gross per 24 hour  Intake 240 ml  Output --  Net 240 ml   Filed Weights   10/25/22 1103  Weight: 90.7 kg   Examination:  General exam: Appears calm and comfortable  Respiratory system: Clear to auscultation. Respiratory effort normal. Cardiovascular system: normal S1 & S2 heard. No JVD, murmurs, rubs, gallops or clicks. No pedal edema. Gastrointestinal system: Abdomen is nondistended, soft and nontender. No organomegaly or masses felt. Normal bowel sounds heard. Central nervous system: Alert and oriented. No focal neurological deficits. Extremities: Symmetric 5 x 5 power. Skin: No rashes, lesions or ulcers. Psychiatry: Judgement and insight appear normal. Mood & affect appropriate.   Data Reviewed: I have personally reviewed following labs and imaging  studies  CBC: Recent Labs  Lab 10/21/22 2017 10/24/22 1155 10/25/22 0553 10/26/22 0603  WBC 12.2* 10.0 8.1 9.3  NEUTROABS  --  7.5  --   --   HGB 8.9* 7.6* 8.0* 8.2*  HCT 27.6* 23.3* 24.7* 25.4*  MCV 89.6 91.7 89.5 90.4  PLT 196 199 186 453    Basic Metabolic Panel: Recent Labs  Lab 10/21/22 2017 10/24/22 1155 10/25/22 0553 10/26/22 0603  NA 137 138 138 139  K 3.1* 3.3* 3.6  3.4*  CL 103 102 106 108  CO2 '25 29 27 26  '$ GLUCOSE 198* 116* 107* 91  BUN 16 9 7* 6*  CREATININE 1.10 0.89 0.89 0.90  CALCIUM 8.5* 8.7* 8.3* 8.3*  MG  --   --  2.0 2.0    CBG: No results for input(s): "GLUCAP" in the last 168 hours.  No results found for this or any previous visit (from the past 240 hour(s)).   Radiology Studies: No results found.  Scheduled Meds:  sodium chloride   Intravenous Once   levothyroxine  25 mcg Oral Q0600   pantoprazole (PROTONIX) IV  40 mg Intravenous Q12H   Continuous Infusions:  sodium chloride 50 mL/hr at 10/26/22 0847     LOS: 2 days   Time spent: 35 mins  Rian Koon Wynetta Emery, MD How to contact the [facility] Attending or Consulting provider [place] or covering provider during after hours [place], for this patient?  Check the care team in [facility] and look for a) attending/consulting TRH provider listed and b) the [facility] team listed Log into [email or web address] and use [place]'s universal password to access. If you do not have the password, please contact the hospital operator. Locate the [facility] provider you are looking for under [facility] and page to a number that you can be directly reached. If you still have difficulty reaching the provider, please page the [facility] (Director on Call) for the Hospitalists listed on amion for assistance.  10/26/2022, 5:19 PM

## 2022-10-26 NOTE — Progress Notes (Signed)
Gastroenterology Progress Note   Referring Provider: No ref. provider found Primary Care Physician:  Darryl Blumenstein Andrea, MD Primary Gastroenterologist:  previously Dr. Oneida Alar  Patient ID: [name]; [id number]; [birth year]   Subjective:    Feels good. No abdominal pain today. No stools since bowel prep. Tolerating diet.   Objective:   Vital signs in last 24 hours: Temp:  [97.7 F (36.5 C)-98.5 F (36.9 C)] 98.5 F (36.9 C) (12/07 0423) Pulse Rate:  [68-81] 73 (12/07 0423) Resp:  [16-20] 20 (12/07 0423) BP: (116-174)/(63-82) 116/63 (12/07 0423) SpO2:  [97 %-100 %] 98 % (12/07 0423) Last BM Date : 10/25/22 General:   Alert,  Well-developed, well-nourished, pleasant and cooperative in NAD. Ambulating in the room Head:  Normocephalic and atraumatic. Eyes:  Sclera clear, no icterus.  Abdomen:  Soft, nontender and nondistended. No masses, hepatosplenomegaly or hernias noted. Normal bowel sounds, without guarding, and without rebound.   Extremities:  Without clubbing, deformity or edema. Neurologic:  Alert and  oriented x4;  grossly normal neurologically. Skin:  Intact without significant lesions or rashes. Psych:  Alert and cooperative. Normal mood and affect.  Intake/Output from previous day: 12/06 0701 - 12/07 0700 In: 1727.8 [I.V.:1727.8] Out: -  Intake/Output this shift: Total I/O In: 240 [P.O.:240] Out: -   Lab Results: CBC Recent Labs    10/24/22 1155 10/25/22 0553 10/26/22 0603  WBC 10.0 8.1 9.3  HGB 7.6* 8.0* 8.2*  HCT 23.3* 24.7* 25.4*  MCV 91.7 89.5 90.4  PLT 199 186 205   BMET Recent Labs    10/24/22 1155 10/25/22 0553 10/26/22 0603  NA 138 138 139  K 3.3* 3.6 3.4*  CL 102 106 108  CO2 '29 27 26  '$ GLUCOSE 116* 107* 91  BUN 9 7* 6*  CREATININE 0.89 0.89 0.90  CALCIUM 8.7* 8.3* 8.3*   LFTs Recent Labs    10/24/22 1155  BILITOT 0.6  ALKPHOS 67  AST 27  ALT 27  PROT 6.3*  ALBUMIN 3.5   No results for input(s): "LIPASE" in the last  72 hours. PT/INR Recent Labs    10/24/22 1155  LABPROT 12.0  INR 0.9         Imaging Studies: CT ANGIO GI BLEED  Result Date: 10/24/2022 CLINICAL DATA:  Rectal bleeding with decreased appetite. EXAM: CTA ABDOMEN AND PELVIS WITHOUT AND WITH CONTRAST TECHNIQUE: Multidetector CT imaging of the abdomen and pelvis was performed using the standard protocol during bolus administration of intravenous contrast. Multiplanar reconstructed images and MIPs were obtained and reviewed to evaluate the vascular anatomy. RADIATION DOSE REDUCTION: This exam was performed according to the departmental dose-optimization program which includes automated exposure control, adjustment of the mA and/or kV according to patient size and/or use of iterative reconstruction technique. CONTRAST:  43m OMNIPAQUE IOHEXOL 350 MG/ML SOLN COMPARISON:  08/26/2013 FINDINGS: VASCULAR Aorta: Normal caliber aorta without aneurysm, dissection, vasculitis or significant stenosis. Atherosclerotic calcification evident. Celiac: Patent without evidence of aneurysm, dissection, vasculitis or significant stenosis. SMA: Patent without evidence of aneurysm, dissection, vasculitis or significant stenosis. Renals: Both renal arteries are patent without evidence of aneurysm, dissection, vasculitis, fibromuscular dysplasia or significant stenosis. IMA: Patent without evidence of aneurysm, dissection, vasculitis or significant stenosis. Inflow: Patent without evidence of aneurysm, dissection, vasculitis or significant stenosis. Proximal Outflow: Bilateral common femoral and visualized portions of the superficial and profunda femoral arteries are patent without evidence of aneurysm, dissection, vasculitis or significant stenosis. Veins: No obvious venous abnormality within the limitations of this arterial  phase study. Review of the MIP images confirms the above findings. NON-VASCULAR Lower chest: Unremarkable Hepatobiliary: No suspicious focal abnormality  within the liver parenchyma. Gallbladder is distended with numerous noncalcified gallstones evident. No intrahepatic or extrahepatic biliary dilation. Pancreas: No focal mass lesion. No dilatation of the main duct. No intraparenchymal cyst. No peripancreatic edema. Spleen: No splenomegaly. No focal mass lesion. Adrenals/Urinary Tract: No adrenal nodule or mass. Kidneys unremarkable. No evidence for hydroureter. The urinary bladder appears normal for the degree of distention. Stomach/Bowel: Stomach is unremarkable. No gastric wall thickening. No evidence of outlet obstruction. Duodenum is normally positioned as is the ligament of Treitz. No small bowel wall thickening. No small bowel dilatation. The terminal ileum is normal. The wall the cecum is ill-defined in there is subtle pericecal edema (see axial 150/11) in the region of the ileocecal valve. Multiple colonic diverticuli evident including in the cecum/right colon. No evidence for arterial phase contrast extravasation in the stomach, small bowel, or colon to suggest active arterial GI hemorrhage. Lymphatic: Upper normal lymph nodes identified in the hepatoduodenal ligament. No retroperitoneal or mesenteric lymphadenopathy. No pelvic sidewall lymphadenopathy. Reproductive: Prostate gland is enlarged. Other: No intraperitoneal free fluid. Musculoskeletal: No worrisome lytic or sclerotic osseous abnormality. IMPRESSION: 1. No evidence for arterial phase contrast extravasation in the stomach, small bowel, or colon to suggest active arterial GI hemorrhage. 2. The wall of the cecum is ill-defined and there is subtle pericecal edema in the region of the ileocecal valve. Imaging features are nonspecific and may be related to infectious/inflammatory colitis. Given the numerous right-sided colonic diverticuli, diverticulitis would also be a consideration. 3. Cholelithiasis. 4. Prostatomegaly. Electronically Signed   By: Misty Stanley M.D.   On: 10/24/2022 14:19  [2  weeks]  Assessment:  Tim Cunningham is a 71 y.o. year old male with a history of rectal carcinoid tumor s/p transanal resection by Dr. Paulita Fujita in Jan 2014, diverticular bleed in 2016 s/p clip placement and epi, presenting over the weekend to the ED with rectal bleeding and Hgb 8.9, down from 12.8 earlier this year. Presenting again today at the request of PCP due to declining Hgb and rectal bleeding.    Rectal bleeding: New onset low-volume hematochezia with every bowel movement prior to Thanksgiving.  No abdominal pain.  Takes Aleve routinely for back pain.  Has taken Goody powders as well. Hgb at presentation 7.6, 5 months prior 12.8. Today, Hgb 8.2.  CTA with wall of cecum ill-defined and subtle pericecal edema in the region of the IC valve nonspecific.  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 Pancolonic diverticulosis.  Ileocecal valve ulcer biopsied.  Recent lower GI bleeding likely from ileocecal valve/terminal ileum.  This could be NSAID effect but cannot rule out underlying IBD.  Plan:   Advance diet.  Avoid NSAIDs/ASA powders. Follow-up pathology. We will arrange for outpatient follow up. GI to sign off, call with questions.    LOS: 2 days   Laureen Ochs. Bernarda Caffey [facility] [phone] 12/7/20231:51 PM

## 2022-10-26 NOTE — Telephone Encounter (Signed)
Please arrange for hospital follow up with dr. Abbey Chatters or APP

## 2022-10-26 NOTE — Progress Notes (Signed)
Pt. C/o nausea refractory to zofran. Dr. Clearence Ped notified.

## 2022-10-26 NOTE — Progress Notes (Signed)
Pt. Also c/o severe abdominal pain refractory to tylenol. Dr. Clearence Ped made aware.

## 2022-10-26 NOTE — Progress Notes (Signed)
Pt has been ambulatory to the bathroom independently this entire shift, sat up in the chair intermittently. GI consulted about advancing diet, pt has done well with the progression, no pain, no N/V/D, no melena. Pt states "I am doing better today".

## 2022-10-27 DIAGNOSIS — K633 Ulcer of intestine: Secondary | ICD-10-CM | POA: Diagnosis not present

## 2022-10-27 DIAGNOSIS — K922 Gastrointestinal hemorrhage, unspecified: Secondary | ICD-10-CM | POA: Diagnosis not present

## 2022-10-27 MED ORDER — POTASSIUM CHLORIDE CRYS ER 20 MEQ PO TBCR
40.0000 meq | EXTENDED_RELEASE_TABLET | Freq: Once | ORAL | Status: AC
  Administered 2022-10-27: 40 meq via ORAL
  Filled 2022-10-27: qty 2

## 2022-10-27 NOTE — Care Management Important Message (Signed)
Important Message  Patient Details  Name: Tim Cunningham MRN: [id number] Date of Birth: 25-Jun-1951   Medicare Important Message Given:  Yes     [name] 10/27/2022, 10:38 AM

## 2022-10-27 NOTE — Care Management CC44 (Signed)
Condition Code 44 Documentation Completed  Patient Details  Name: Tim Cunningham MRN: [id number] Date of Birth: [birth year]   Condition Code 44 given:  Yes Patient signature on Condition Code 44 notice:  Yes Documentation of 2 MD's agreement:  Yes Code 44 added to claim:  Yes    Shade Flood, LCSW 10/27/2022, 11:46 AM

## 2022-10-27 NOTE — Discharge Instructions (Signed)
PLEASE AVOID ALL NSAIDS (ibuprofen, aleve, Goody Powder, naproxen) Please follow up with GI office for biopsy results.    IMPORTANT INFORMATION: PAY CLOSE ATTENTION   PHYSICIAN DISCHARGE INSTRUCTIONS  Follow with Primary care provider  Leslie Andrea, MD  and other consultants as instructed by your Hospitalist Physician  [place] IF SYMPTOMS COME BACK, WORSEN OR NEW PROBLEM DEVELOPS   Please note: You were cared for by a hospitalist during your hospital stay. Every effort will be made to forward records to your primary care provider.  You can request that your primary care provider send for your hospital records if they have not received them.  Once you are discharged, your primary care physician will handle any further medical issues. Please note that NO REFILLS for any discharge medications will be authorized once you are discharged, as it is imperative that you return to your primary care physician (or establish a relationship with a primary care physician if you do not have one) for your post hospital discharge needs so that they can reassess your need for medications and monitor your lab values.  Please get a complete blood count and chemistry panel checked by your Primary MD at your next visit, and again as instructed by your Primary MD.  Get Medicines reviewed and adjusted: Please take all your medications with you for your next visit with your Primary MD  Laboratory/radiological data: Please request your Primary MD to go over all hospital tests and procedure/radiological results at the follow up, please ask your primary care provider to get all Hospital records sent to his/her office.  In some cases, they will be blood work, cultures and biopsy results pending at the time of your discharge. Please request that your primary care provider follow up on these results.  If you are diabetic, please bring your blood sugar readings with you to your  follow up appointment with primary care.    Please call and make your follow up appointments as soon as possible.    Also Note the following: If you experience worsening of your admission symptoms, develop shortness of breath, life threatening emergency, suicidal or homicidal thoughts you must seek medical attention immediately by calling 911 or calling your MD immediately  if symptoms less severe.  You must read complete instructions/literature along with all the possible adverse reactions/side effects for all the Medicines you take and that have been prescribed to you. Take any new Medicines after you have completely understood and accpet all the possible adverse reactions/side effects.   Do not drive when taking Pain medications or sleeping medications (Benzodiazepines)  Do not take more than prescribed Pain, Sleep and Anxiety Medications. It is not advisable to combine anxiety,sleep and pain medications without talking with your primary care practitioner  Special Instructions: If you have smoked or chewed Tobacco  in the last 2 yrs please stop smoking, stop any regular Alcohol  and or any Recreational drug use.  Wear Seat belts while driving.  Do not drive if taking any narcotic, mind altering or controlled substances or recreational drugs or alcohol.

## 2022-10-27 NOTE — Discharge Summary (Signed)
Physician Discharge Summary  Tim Cunningham DXI:[id number] DOB: 20-Oct-1951 DOA: 10/24/2022  PCP: Leslie Andrea, MD GI: [facility]   Admit date: 10/24/2022 Discharge date: 10/27/2022  Admitted From:  Home  Disposition:  Home   Recommendations for Outpatient Follow-up:  Follow up with PCP in 1 weeks Please follow up with [facility] in 2 weeks  Please avoid all NSAIDS  Please follow up on the following pending results: biopsy results   Discharge Condition: STABLE   CODE STATUS: FULL DIET: soft foods recommended    Brief Hospitalization Summary: Please see all hospital notes, images, labs for full details of the hospitalization. ADMISSION HPI:  71 y.o. male with a history of diverticular bleed, carcinoma tumor resection, Hypothyroidism, HTN, HLD, and R knee arthritis who presented to the ED on 10/24/22 with blood in the stool. He was in the ED on 10/21/22 for hypertension and reddish stool. He was found to be anemia, but was released without further workup and told to follow with his PCP. PCP found he was still anemic and still reported red stools, so he was sent back to the ED today for further workup. Last blood stool was yesterday. He is being admitted for GI bleed.   ED Course: CMP showed K+ 3.3, Glucose 116, Ca8.7 with normal (3.5) albumin, total protein of 6.3 CBC showed hgb 7.6 FOBT ordered and is pending. Abdominal CT showed No active arterial GI bleed, possible colitis or diverticulitis, cholelithiasis, and prostatomegaly. PTT 12.0, INR 0.9 Type and screen completed in ED.  Hospital Course by Problem   Rectal Bleeding - GI service s/p upper endoscopy on 12/6 - GI took biopsies and recommended to avoid all NSAIDS - repeat CBC with stable Hg  - continue supportive measures  - continue protonix for now - EGD: Extensively ulcerated ileocecal valve with edematous orifice; would not admit the colonoscope.   -Pancolonic diverticulosis. Clustering of diverticula in the area of  the cecum. Diverticula without bleeding stigmata. Diverticulosis in the entire examined colon. -Ileocecal valve ulcer biopsied. -Recent lower GI bleeding likely from the ileocecal valve/terminal ileum. This could simply be NSAID effect. Cannot rule out underlying inflammatory bowel disease at this time.   Hypothyroidism  - resumed home dose levothyroxine    Hypokalemia - repleted orally   Normocytic Anemia - recheck CBC outpatient on follow up    Essential hypertension  - resumed home meds; follow outpatient    Discharge Diagnoses:  Principal Problem:   GI bleed Active Problems:   Colonic ulcer   History of colonic polyps   Lower GI bleed   Discharge Instructions:  Allergies as of 10/27/2022   No Known Allergies      Medication List     STOP taking these medications    aspirin EC 81 MG tablet   HYDROcodone-acetaminophen 5-325 MG tablet Commonly known as: Norco       TAKE these medications    hydrochlorothiazide 25 MG tablet Commonly known as: HYDRODIURIL Take 25 mg by mouth daily. for high blood pressure   levothyroxine 25 MCG tablet Commonly known as: SYNTHROID TAKE 1 TABLET BY MOUTH ONCE DAILY FOR LOW THYROID   losartan 50 MG tablet Commonly known as: COZAAR Take 50 mg by mouth daily.   lovastatin 40 MG tablet Commonly known as: MEVACOR Take 40 mg by mouth daily.   multivitamin with minerals Tabs tablet Take 1 tablet by mouth every morning.   omeprazole 20 MG capsule Commonly known as: PRILOSEC TAKE 1 CAPSULE BY MOUTH  ONCE DAILY BEFORE  BREAKFAST   sildenafil 20 MG tablet Commonly known as: REVATIO Take 20 mg by mouth daily as needed (FOR ERECTILE DYSFuncation).        Follow-up Information     Leslie Andrea, MD. Schedule an appointment as soon as possible for a visit in 1 week(s).   Specialty: Family Medicine Why: Hospital Follow Up Contact information: [place] [address] [place]. Schedule an appointment as soon as possible for a visit in 2 week(s).   Why: Hospital Follow Up Contact information: [address] [place] [phone]               No Known Allergies Allergies as of 10/27/2022   No Known Allergies      Medication List     STOP taking these medications    aspirin EC 81 MG tablet   HYDROcodone-acetaminophen 5-325 MG tablet Commonly known as: Norco       TAKE these medications    hydrochlorothiazide 25 MG tablet Commonly known as: HYDRODIURIL Take 25 mg by mouth daily. for high blood pressure   levothyroxine 25 MCG tablet Commonly known as: SYNTHROID TAKE 1 TABLET BY MOUTH ONCE DAILY FOR LOW THYROID   losartan 50 MG tablet Commonly known as: COZAAR Take 50 mg by mouth daily.   lovastatin 40 MG tablet Commonly known as: MEVACOR Take 40 mg by mouth daily.   multivitamin with minerals Tabs tablet Take 1 tablet by mouth every morning.   omeprazole 20 MG capsule Commonly known as: PRILOSEC TAKE 1 CAPSULE BY MOUTH ONCE DAILY BEFORE  BREAKFAST   sildenafil 20 MG tablet Commonly known as: REVATIO Take 20 mg by mouth daily as needed (FOR ERECTILE DYSFuncation).        Procedures/Studies: CT ANGIO GI BLEED  Result Date: 10/24/2022 CLINICAL DATA:  Rectal bleeding with decreased appetite. EXAM: CTA ABDOMEN AND PELVIS WITHOUT AND WITH CONTRAST TECHNIQUE: Multidetector CT imaging of the abdomen and pelvis was performed using the standard protocol during bolus administration of intravenous contrast. Multiplanar reconstructed images and MIPs were obtained and reviewed to evaluate the vascular anatomy. RADIATION DOSE REDUCTION: This exam was performed according to the departmental dose-optimization program which includes automated exposure control, adjustment of the mA and/or kV according to patient size and/or use of iterative reconstruction technique. CONTRAST:   59m OMNIPAQUE IOHEXOL 350 MG/ML SOLN COMPARISON:  08/26/2013 FINDINGS: VASCULAR Aorta: Normal caliber aorta without aneurysm, dissection, vasculitis or significant stenosis. Atherosclerotic calcification evident. Celiac: Patent without evidence of aneurysm, dissection, vasculitis or significant stenosis. SMA: Patent without evidence of aneurysm, dissection, vasculitis or significant stenosis. Renals: Both renal arteries are patent without evidence of aneurysm, dissection, vasculitis, fibromuscular dysplasia or significant stenosis. IMA: Patent without evidence of aneurysm, dissection, vasculitis or significant stenosis. Inflow: Patent without evidence of aneurysm, dissection, vasculitis or significant stenosis. Proximal Outflow: Bilateral common femoral and visualized portions of the superficial and profunda femoral arteries are patent without evidence of aneurysm, dissection, vasculitis or significant stenosis. Veins: No obvious venous abnormality within the limitations of this arterial phase study. Review of the MIP images confirms the above findings. NON-VASCULAR Lower chest: Unremarkable Hepatobiliary: No suspicious focal abnormality within the liver parenchyma. Gallbladder is distended with numerous noncalcified gallstones evident. No intrahepatic or extrahepatic biliary dilation. Pancreas: No focal mass lesion. No dilatation of the main duct. No intraparenchymal cyst. No peripancreatic edema. Spleen: No splenomegaly. No focal mass  lesion. Adrenals/Urinary Tract: No adrenal nodule or mass. Kidneys unremarkable. No evidence for hydroureter. The urinary bladder appears normal for the degree of distention. Stomach/Bowel: Stomach is unremarkable. No gastric wall thickening. No evidence of outlet obstruction. Duodenum is normally positioned as is the ligament of Treitz. No small bowel wall thickening. No small bowel dilatation. The terminal ileum is normal. The wall the cecum is ill-defined in there is subtle  pericecal edema (see axial 150/11) in the region of the ileocecal valve. Multiple colonic diverticuli evident including in the cecum/right colon. No evidence for arterial phase contrast extravasation in the stomach, small bowel, or colon to suggest active arterial GI hemorrhage. Lymphatic: Upper normal lymph nodes identified in the hepatoduodenal ligament. No retroperitoneal or mesenteric lymphadenopathy. No pelvic sidewall lymphadenopathy. Reproductive: Prostate gland is enlarged. Other: No intraperitoneal free fluid. Musculoskeletal: No worrisome lytic or sclerotic osseous abnormality. IMPRESSION: 1. No evidence for arterial phase contrast extravasation in the stomach, small bowel, or colon to suggest active arterial GI hemorrhage. 2. The wall of the cecum is ill-defined and there is subtle pericecal edema in the region of the ileocecal valve. Imaging features are nonspecific and may be related to infectious/inflammatory colitis. Given the numerous right-sided colonic diverticuli, diverticulitis would also be a consideration. 3. Cholelithiasis. 4. Prostatomegaly. Electronically Signed   By: Misty Stanley M.D.   On: 10/24/2022 14:19     Subjective: Pt reports that he feels well and tolerating diet.  No specific complaints, wants to go home.   Discharge Exam: Vitals:   10/26/22 2002 10/27/22 0525  BP: (!) 145/69 127/70  Pulse: 62 73  Resp: 19   Temp: 98.4 F (36.9 C) 99 F (37.2 C)  SpO2: 99% 99%   Vitals:   10/26/22 0423 10/26/22 1411 10/26/22 2002 10/27/22 0525  BP: 116/63 (!) 150/66 (!) 145/69 127/70  Pulse: 73 68 62 73  Resp: 20  19   Temp: 98.5 F (36.9 C) 98 F (36.7 C) 98.4 F (36.9 C) 99 F (37.2 C)  TempSrc: Oral     SpO2: 98% 100% 99% 99%  Weight:      Height:       General: Pt is alert, awake, not in acute distress Cardiovascular: RRR, S1/S2 +, no rubs, no gallops Respiratory: CTA bilaterally, no wheezing, no rhonchi Abdominal: Soft, NT, ND, bowel sounds + Extremities:  no edema, no cyanosis   The results of significant diagnostics from this hospitalization (including imaging, microbiology, ancillary and laboratory) are listed below for reference.     Microbiology: No results found for this or any previous visit (from the past 240 hour(s)).   Labs: BNP (last 3 results) No results for input(s): "BNP" in the last 8760 hours. Basic Metabolic Panel: Recent Labs  Lab 10/21/22 2017 10/24/22 1155 10/25/22 0553 10/26/22 0603  NA 137 138 138 139  K 3.1* 3.3* 3.6 3.4*  CL 103 102 106 108  CO2 '25 29 27 26  '$ GLUCOSE 198* 116* 107* 91  BUN 16 9 7* 6*  CREATININE 1.10 0.89 0.89 0.90  CALCIUM 8.5* 8.7* 8.3* 8.3*  MG  --   --  2.0 2.0   Liver Function Tests: Recent Labs  Lab 10/24/22 1155  AST 27  ALT 27  ALKPHOS 67  BILITOT 0.6  PROT 6.3*  ALBUMIN 3.5   No results for input(s): "LIPASE", "AMYLASE" in the last 168 hours. No results for input(s): "AMMONIA" in the last 168 hours. CBC: Recent Labs  Lab 10/21/22 2017 10/24/22 1155 10/25/22  6384 10/26/22 0603  WBC 12.2* 10.0 8.1 9.3  NEUTROABS  --  7.5  --   --   HGB 8.9* 7.6* 8.0* 8.2*  HCT 27.6* 23.3* 24.7* 25.4*  MCV 89.6 91.7 89.5 90.4  PLT 196 199 186 205   Cardiac Enzymes: No results for input(s): "CKTOTAL", "CKMB", "CKMBINDEX", "TROPONINI" in the last 168 hours. BNP: Invalid input(s): "POCBNP" CBG: No results for input(s): "GLUCAP" in the last 168 hours. D-Dimer No results for input(s): "DDIMER" in the last 72 hours. Hgb A1c No results for input(s): "HGBA1C" in the last 72 hours. Lipid Profile No results for input(s): "CHOL", "HDL", "LDLCALC", "TRIG", "CHOLHDL", "LDLDIRECT" in the last 72 hours. Thyroid function studies No results for input(s): "TSH", "T4TOTAL", "T3FREE", "THYROIDAB" in the last 72 hours.  Invalid input(s): "FREET3" Anemia work up No results for input(s): "VITAMINB12", "FOLATE", "FERRITIN", "TIBC", "IRON", "RETICCTPCT" in the last 72 hours. Urinalysis No  results found for: "COLORURINE", "APPEARANCEUR", "LABSPEC", "PHURINE", "GLUCOSEU", "HGBUR", "BILIRUBINUR", "KETONESUR", "PROTEINUR", "UROBILINOGEN", "NITRITE", "LEUKOCYTESUR" Sepsis Labs Recent Labs  Lab 10/21/22 2017 10/24/22 1155 10/25/22 0553 10/26/22 0603  WBC 12.2* 10.0 8.1 9.3   Microbiology No results found for this or any previous visit (from the past 240 hour(s)).  Time coordinating discharge: 33 mins   SIGNED:  Irwin Brakeman, MD  [facility] 10/27/2022, 9:24 AM How to contact the [facility] Attending or Consulting provider [place] or covering provider during after hours [place], for this patient?  Check the care team in [facility] and look for a) attending/consulting [facility] provider listed and b) the [facility] team listed Log into [email or web address] and use [place]'s universal password to access. If you do not have the password, please contact the hospital operator. Locate the [facility] provider you are looking for under [facility] and page to a number that you can be directly reached. If you still have difficulty reaching the provider, please page the [facility] (Director on Call) for the Hospitalists listed on amion for assistance.

## 2022-10-27 NOTE — Care Management Obs Status (Signed)
[facility] NOTIFICATION   Patient Details  Name: NYZIR DUBOIS MRN: [id number] Date of Birth: [birth year]   Medicare Observation Status Notification Given:  Yes    Shade Flood, LCSW 10/27/2022, 11:46 AM

## 2022-10-30 LAB — SURGICAL PATHOLOGY

## 2022-11-03 ENCOUNTER — Encounter (HOSPITAL_COMMUNITY): Payer: Self-pay | Admitting: Internal Medicine

## 2022-11-03 ENCOUNTER — Other Ambulatory Visit: Payer: Self-pay

## 2022-11-03 ENCOUNTER — Encounter: Payer: Self-pay | Admitting: Internal Medicine

## 2022-11-03 DIAGNOSIS — K922 Gastrointestinal hemorrhage, unspecified: Secondary | ICD-10-CM

## 2022-11-09 ENCOUNTER — Inpatient Hospital Stay: Payer: Medicare HMO | Admitting: Gastroenterology

## 2022-12-14 ENCOUNTER — Other Ambulatory Visit: Payer: Self-pay

## 2022-12-14 ENCOUNTER — Encounter: Payer: Self-pay | Admitting: Gastroenterology

## 2022-12-14 ENCOUNTER — Ambulatory Visit: Payer: Medicare PPO | Admitting: Gastroenterology

## 2022-12-14 VITALS — BP 137/72 | HR 56 | Temp 98.1°F | Ht 66.0 in | Wt 203.4 lb

## 2022-12-14 DIAGNOSIS — K922 Gastrointestinal hemorrhage, unspecified: Secondary | ICD-10-CM | POA: Diagnosis not present

## 2022-12-14 NOTE — Patient Instructions (Signed)
Please complete blood work today. We will call with results!  No further colonoscopy recommended unless you have recurrent issues.  Continue to avoid Ibuprofen, Advil, Aleve, Motrin, Goody powders, aspirin powders, etc.   We will see you back as needed!  I enjoyed seeing you again today! At our first visit, I mentioned how I value our relationship and want to provide genuine, compassionate, and quality care. You may receive a survey regarding your visit with me, and I welcome your feedback! Thanks so much for taking the time to complete this. I look forward to seeing you again.   Annitta Needs, PhD, ANP-BC [facility]

## 2022-12-14 NOTE — Progress Notes (Signed)
Gastroenterology Office Note     Primary Care Physician:  Leslie Andrea, MD  Primary Gastroenterologist: Dr. Abbey Chatters    Chief Complaint   Chief Complaint  Patient presents with   Follow-up    ED follow up from GI bleed     History of Present Illness   Tim Cunningham is a 72 y.o. male presenting today in follow-up with a history of rectal carcinoid tumor s/p transanal resection by Dr. Paulita Fujita in Jan 2014, diverticular bleed in 2016 s/p clip placement and epi, presenting to the ED in Dec 2023 with GI bleed s/p colonoscopy revealing extensive ulcerated ICV with edematous orifice and unable to advance scope. This was felt secondary to NSAID effect. Benign colon polyp removed.   Hgb on admission 8.9, down to 7.6. During hospitalization, he had 1 unit PRBCs given. Discharge Hgb 8.2.   Needs repeat H/H. No abdominal pain, N/V, changes in bowel habits, constipation, diarrhea, overt GI bleeding, dysphagia, unexplained weight loss, lack of appetite, unexplained weight gain.    Omeprazole daily controls GERD. No longer taking any NSAIDs.   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 Oneida Alar: SML IH, RECTAL CARCINOID, HYPEPRPLASTIC POLYPS(2), Moderate TICS/Small lipoma IN HF   COLONOSCOPY N/A 03/15/2015   Dr. Gala Romney: Pancolonic diverticulosis. Arterial bleeding arising from a cecal diverticulum status post hemostasis clip placement and injection therapy . Stie of prior rectal mucosal tattooing identified with no evidence of recurrent carcinoid tumor. No EGD done today,.   COLONOSCOPY WITH PROPOFOL N/A  10/25/2022   Procedure: COLONOSCOPY WITH PROPOFOL;  Surgeon: Daneil Dolin, MD;  Location: [facility];  Service: Endoscopy;  Laterality: N/A;   ESOPHAGOGASTRODUODENOSCOPY  11/01/2012   Dr. Fields:Schatzki ring was found/MILD GASTRITIS/DUODENIIS   EUS  12/11/2012   RECTAL CARCINOID EXCISED-MARGINS NOT CLEAR   FLEXIBLE SIGMOIDOSCOPY  Jan 2016   WITH EUS. Dr. Paulita Fujita. NO residual lesion identified   POLYPECTOMY  10/25/2022   Procedure: POLYPECTOMY;  Surgeon: Daneil Dolin, MD;  Location: [facility];  Service: Endoscopy;;   TONSILLECTOMY      Current Outpatient Medications  Medication Sig Dispense Refill   cholecalciferol (VITAMIN D3) 25 MCG (1000 UNIT) tablet Take 1,000 Units by mouth daily.     hydrochlorothiazide (HYDRODIURIL) 25 MG tablet Take 25 mg by mouth daily. for high blood pressure     levothyroxine (SYNTHROID) 25 MCG tablet TAKE 1 TABLET BY MOUTH ONCE DAILY FOR LOW THYROID     losartan (COZAAR) 50 MG tablet Take 50 mg by mouth daily.     lovastatin (MEVACOR) 40 MG tablet Take 40 mg by mouth daily.     Multiple Vitamin (MULTIVITAMIN WITH MINERALS) TABS Take 1 tablet by mouth every morning.     omeprazole (PRILOSEC) 20 MG capsule TAKE 1 CAPSULE BY MOUTH ONCE DAILY BEFORE  BREAKFAST 30 capsule 5   sildenafil (REVATIO) 20 MG tablet Take 20 mg by mouth daily as needed (FOR ERECTILE DYSFuncation).     No current facility-administered medications for this visit.    Allergies  as of 12/14/2022   (No Known Allergies)    Family History  Problem Relation Age of Onset   Cancer Mother    Cancer Maternal Uncle    Colon cancer Other        maternal great uncle   Lung cancer Other        uncles    Social History   Socioeconomic History   Marital status: Divorced    Spouse name: Not on file   Number of children: 5   Years of education: Not on file   Highest education level: Not on file  Occupational History   Occupation: unemployed  Tobacco Use   Smoking status: Former     Packs/day: 0.50    Years: 10.00    Total pack years: 5.00    Types: Cigarettes    Quit date: 11/01/1981    Years since quitting: 41.1   Smokeless tobacco: Never   Tobacco comments:    quit 1999  Substance and Sexual Activity   Alcohol use: Yes    Comment: 1-2 drinks a month   Drug use: No   Sexual activity: Yes    Birth control/protection: None  Other Topics Concern   Not on file  Social History Narrative   Not on file   Social Determinants of Health   Financial Resource Strain: Not on file  Food Insecurity: No Food Insecurity (10/25/2022)   Hunger Vital Sign    Worried About Running Out of Food in the Last Year: Never true    [place] in the Last Year: Never true  Transportation Needs: No Transportation Needs (10/25/2022)   PRAPARE - Hydrologist (Medical): No    Lack of Transportation (Non-Medical): No  Physical Activity: Not on file  Stress: Not on file  Social Connections: Not on file  Intimate Partner Violence: Not At Risk (10/25/2022)   Humiliation, Afraid, Rape, and Kick questionnaire    Fear of Current or Ex-Partner: No    Emotionally Abused: No    Physically Abused: No    Sexually Abused: No     Review of Systems   Gen: Denies any fever, chills, fatigue, weight loss, lack of appetite.  CV: Denies chest pain, heart palpitations, peripheral edema, syncope.  Resp: Denies shortness of breath at rest or with exertion. Denies wheezing or cough.  GI: Denies dysphagia or odynophagia. Denies jaundice, hematemesis, fecal incontinence. GU : Denies urinary burning, urinary frequency, urinary hesitancy MS: Denies joint pain, muscle weakness, cramps, or limitation of movement.  Derm: Denies rash, itching, dry skin Psych: Denies depression, anxiety, memory loss, and confusion Heme: Denies bruising, bleeding, and enlarged lymph nodes.   Physical Exam   BP 137/72   Pulse (!) 56   Temp 98.1 F (36.7 C)   Ht '5\' 6"'$  (1.676 m)   Wt  203 lb 6.4 oz (92.3 kg)   BMI 32.83 kg/m  General:   Alert and oriented. Pleasant and cooperative. Well-nourished and well-developed.  Head:  Normocephalic and atraumatic. Eyes:  Without icterus Abdomen:  +BS, soft, non-tender and non-distended. No HSM noted. No guarding or rebound. No masses appreciated.  Rectal:  Deferred  Msk:  Symmetrical without gross deformities. Normal posture. Extremities:  Without edema. Neurologic:  Alert and  oriented x4;  grossly normal neurologically. Skin:  Intact without significant lesions or rashes. Psych:  Alert and cooperative. Normal mood and affect.   Assessment   Tim Cunningham is a 72 y.o. male  presenting today in follow-up with a history of rectal carcinoid tumor s/p transanal resection by Dr. Paulita Fujita in Jan 2014, diverticular bleed in 2016 s/p clip placement and epi, presenting to the ED in Dec 2023 with GI bleed s/p colonoscopy revealing extensive ulcerated ICV with edematous orifice and unable to advance scope. This was felt secondary to NSAID effect. Benign colon polyp removed.   Doing well at this time without further overt GI bleeding. Avoiding NSAIDs. Received 1 unit PRBCs while inpatient with discharge Hgb 8.2. We will recheck CBC today.   GERD controlled well with omeprazole daily.     PLAN    Check CBC PPI daily Continue to avoid NSAIDs No repeat colonoscopy unless clinically indicated   Annitta Needs, PhD, ANP-BC [facility]

## 2022-12-15 LAB — CBC WITH DIFFERENTIAL/PLATELET
Basophils Absolute: 0.1 10*3/uL (ref 0.0–0.2)
Basos: 1 %
EOS (ABSOLUTE): 0.4 10*3/uL (ref 0.0–0.4)
Eos: 5 %
Hematocrit: 37.5 % (ref 37.5–51.0)
Hemoglobin: 12.3 g/dL — ABNORMAL LOW (ref 13.0–17.7)
Immature Grans (Abs): 0 10*3/uL (ref 0.0–0.1)
Immature Granulocytes: 0 %
Lymphocytes Absolute: 1.8 10*3/uL (ref 0.7–3.1)
Lymphs: 22 %
MCH: 27.9 pg (ref 26.6–33.0)
MCHC: 32.8 g/dL (ref 31.5–35.7)
MCV: 85 fL (ref 79–97)
Monocytes Absolute: 0.7 10*3/uL (ref 0.1–0.9)
Monocytes: 8 %
Neutrophils Absolute: 5.2 10*3/uL (ref 1.4–7.0)
Neutrophils: 64 %
Platelets: 257 10*3/uL (ref 150–450)
RBC: 4.41 x10E6/uL (ref 4.14–5.80)
RDW: 13.4 % (ref 11.6–15.4)
WBC: 8.2 10*3/uL (ref 3.4–10.8)

## 2024-06-30 ENCOUNTER — Encounter (HOSPITAL_COMMUNITY): Payer: Self-pay | Admitting: Nurse Practitioner

## 2024-07-16 ENCOUNTER — Other Ambulatory Visit: Payer: Self-pay | Admitting: Urology

## 2024-07-16 DIAGNOSIS — R972 Elevated prostate specific antigen [PSA]: Secondary | ICD-10-CM

## 2024-07-18 ENCOUNTER — Encounter: Payer: Self-pay | Admitting: Urology

## 2024-07-31 ENCOUNTER — Ambulatory Visit
Admission: RE | Admit: 2024-07-31 | Discharge: 2024-07-31 | Disposition: A | Discharge status: Home / Self Care | Source: Ambulatory Visit | Attending: Urology | Admitting: Urology

## 2024-07-31 DIAGNOSIS — R972 Elevated prostate specific antigen [PSA]: Secondary | ICD-10-CM

## 2024-07-31 MED ORDER — GADOPICLENOL 0.5 MMOL/ML IV SOLN
10.0000 mL | Freq: Once | INTRAVENOUS | Status: AC | PRN
  Administered 2024-07-31: 10 mL via INTRAVENOUS

## 2024-08-31 NOTE — Progress Notes (Unsigned)
 Cardiology Office Note   Date:  09/03/2024   ID:  RAYMOND BHARDWAJ, DOB 1951-05-12, MRN [id number]  PCP:  [facility]  Cardiologist:   None Referring:    Chief Complaint  Patient presents with   Bradycardia      History of Present Illness: Tim Cunningham is a 73 y.o. male who is referred for evaluation of bradycarida.  He was noted to have this.  He does not feel this.  He was noted to have this recently.  The patient denies any new symptoms such as chest discomfort, neck or arm discomfort. There has been no new shortness of breath, PND or orthopnea. There have been no reported palpitations, presyncope or syncope.    He walks routinely.  He denies any cardiovascular symptoms with this.  He was noted to have a right bundle branch block with a heart rate of 60.  I did review outside records.  He has had normal electrolytes.  He is managed for hypothyroidism and has normal levels.  He has had a normal BNP.    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 Harvey: SML IH, RECTAL CARCINOID, HYPEPRPLASTIC POLYPS(2), Moderate TICS/Small lipoma IN HF   COLONOSCOPY N/A 03/15/2015   Dr. Shaaron: Pancolonic diverticulosis. Arterial bleeding arising from a cecal diverticulum status post hemostasis clip placement and injection therapy . Stie of prior rectal mucosal tattooing identified with no evidence of recurrent carcinoid tumor. No EGD done today,.   COLONOSCOPY WITH PROPOFOL  N/A 10/25/2022    Procedure: COLONOSCOPY WITH PROPOFOL ;  Surgeon: Shaaron Lamar HERO, MD;  Location: [facility];  Service: Endoscopy;  Laterality: N/A;   ESOPHAGOGASTRODUODENOSCOPY  11/01/2012   Dr. Fields:Schatzki ring was found/MILD GASTRITIS/DUODENIIS   EUS  12/11/2012   RECTAL CARCINOID EXCISED-MARGINS NOT CLEAR   FLEXIBLE SIGMOIDOSCOPY  Jan 2016   WITH EUS. Dr. Burnette. NO residual lesion identified   POLYPECTOMY  10/25/2022   Procedure: POLYPECTOMY;  Surgeon: Shaaron Lamar HERO, MD;  Location: [facility];  Service: Endoscopy;;   TONSILLECTOMY       Current Outpatient Medications  Medication Sig Dispense Refill   albuterol  (VENTOLIN  HFA) 108 (90 Base) MCG/ACT inhaler as needed.     cholecalciferol (VITAMIN D3) 25 MCG (1000 UNIT) tablet Take 1,000 Units by mouth daily.     hydrochlorothiazide (HYDRODIURIL) 25 MG tablet Take 25 mg by mouth daily. for high blood pressure     HYDROcodone -acetaminophen  (NORCO/VICODIN) 5-325 MG tablet Take 1 tablet by mouth daily as needed.     levothyroxine  (SYNTHROID ) 25 MCG tablet TAKE 1 TABLET BY MOUTH ONCE DAILY FOR LOW THYROID     losartan (COZAAR) 50 MG tablet Take 50 mg by mouth daily.     lovastatin (MEVACOR) 40 MG tablet Take 40 mg by mouth daily.     Multiple Vitamin (MULTIVITAMIN WITH MINERALS) TABS Take 1 tablet by mouth every morning.     omeprazole  (PRILOSEC) 20  MG capsule TAKE 1 CAPSULE BY MOUTH ONCE DAILY BEFORE  BREAKFAST 30 capsule 5   pantoprazole  (PROTONIX ) 40 MG tablet Take 40 mg by mouth every morning.     sildenafil (REVATIO) 20 MG tablet Take 20 mg by mouth daily as needed (FOR ERECTILE DYSFuncation).     tamsulosin (FLOMAX) 0.4 MG CAPS capsule Take 0.4 mg by mouth daily.     No current facility-administered medications for this visit.    Allergies:   Patient has no known allergies.    Social History:  The patient  reports that he quit smoking about 42 years ago. His smoking use included cigarettes. He started smoking about 52 years ago. He has a  5 pack-year smoking history. He has never used smokeless tobacco. He reports current alcohol use. He reports that he does not use drugs.   Family History:  The patient's family history includes Cancer in his maternal uncle and mother; Colon cancer in an other family member; Lung cancer in an other family member.    ROS:  Please see the history of present illness.   Otherwise, review of systems are positive for none.   All other systems are reviewed and negative.    PHYSICAL EXAM: VS:  BP 134/70   Pulse 60   Ht 5' 6 (1.676 m)   Wt 194 lb (88 kg)   SpO2 97%   BMI 31.31 kg/m  , BMI Body mass index is 31.31 kg/m. GENERAL:  Well appearing HEENT:  Pupils equal round and reactive, fundi not visualized, oral mucosa unremarkable NECK:  No jugular venous distention, waveform within normal limits, carotid upstroke brisk and symmetric, no bruits, no thyromegaly LYMPHATICS:  No cervical, inguinal adenopathy LUNGS:  Clear to auscultation bilaterally BACK:  No CVA tenderness CHEST:  Unremarkable HEART:  PMI not displaced or sustained,S1 and S2 within normal limits, no S3, no S4, no clicks, no rubs, no murmurs ABD:  Flat, positive bowel sounds normal in frequency in pitch, no bruits, no rebound, no guarding, no midline pulsatile mass, no hepatomegaly, no splenomegaly EXT:  2 plus pulses throughout, no edema, no cyanosis no clubbing SKIN:  No rashes no nodules NEURO:  Cranial nerves II through XII grossly intact, motor grossly intact throughout PSYCH:  Cognitively intact, oriented to person place and time    EKG:  EKG Interpretation Date/Time:  Wednesday September 03 2024 10:45:48 EDT Ventricular Rate:  60 PR Interval:  150 QRS Duration:  154 QT Interval:  446 QTC Calculation: 446 R Axis:   83  Text Interpretation: Normal sinus rhythm with sinus arrhythmia Right bundle branch block When compared with ECG of 14-Mar-2015 22:39, No significant change since last tracing Confirmed by Lavona Agent ([id number]) on 09/03/2024 10:56:08 AM     Recent Labs: No results found for requested labs within last 365 days.    Lipid Panel    Component Value Date/Time   CHOL 238 (A) 09/02/2012 1058   HDL 35 09/02/2012 1058   LDLCALC 164 09/02/2012 1058      Wt Readings from Last 3 Encounters:  09/03/24 194 lb (88 kg)  12/14/22 203 lb 6.4 oz (92.3 kg)  10/25/22 199 lb 15.3 oz (90.7 kg)      Other studies Reviewed: Additional studies/ records that were reviewed today include: Primary care office records. Review of the above records demonstrates:  Please see elsewhere in the note.  You   ASSESSMENT AND PLAN:  Bradycardia: Patient has sinus bradycardia with right bundle branch block  but no symptoms related to this.  Lab work is up-to-date.  At this point I do not see indication for further management but I will check a 3-day monitor to make sure there are no sustained bradycardia arrhythmias.  Also given his history of carcinoid and this abnormal EKG I will check an echocardiogram.  This resected rectal carcinoid is unlikely however to cause cardiac valvular abnormalities.  Hypertension: Blood pressure is well-controlled.  Continue the meds as listed.  Avoid AV nodal blocking agents.  Dyslipidemia: His LDL was mildly elevated.  We discussed dietary changes.   Current medicines are reviewed at length with the patient today.  The patient does not have concerns regarding medicines.  The following changes have been made:  no change  Labs/ tests ordered today include:   Orders Placed This Encounter  Procedures   LONG TERM MONITOR (3-14 DAYS)   EKG 12-Lead   ECHOCARDIOGRAM COMPLETE     Disposition:   FU with APP.      Signed, Lynwood Schilling, MD  09/03/2024 12:41 PM    [facility]

## 2024-09-03 ENCOUNTER — Ambulatory Visit: Attending: Cardiology | Admitting: Cardiology

## 2024-09-03 ENCOUNTER — Encounter: Payer: Self-pay | Admitting: Cardiology

## 2024-09-03 ENCOUNTER — Ambulatory Visit

## 2024-09-03 VITALS — BP 134/70 | HR 60 | Ht 66.0 in | Wt 194.0 lb

## 2024-09-03 DIAGNOSIS — E785 Hyperlipidemia, unspecified: Secondary | ICD-10-CM | POA: Diagnosis not present

## 2024-09-03 DIAGNOSIS — R001 Bradycardia, unspecified: Secondary | ICD-10-CM

## 2024-09-03 DIAGNOSIS — R079 Chest pain, unspecified: Secondary | ICD-10-CM | POA: Diagnosis not present

## 2024-09-03 NOTE — Patient Instructions (Signed)
 Medication Instructions:  Your physician recommends that you continue on your current medications as directed. Please refer to the Current Medication list given to you today.  *If you need a refill on your cardiac medications before your next appointment, please call your pharmacy*  Lab Work: NONE If you have labs (blood work) drawn today and your tests are completely normal, you will receive your results only by: MyChart Message (if you have MyChart) OR A paper copy in the mail If you have any lab test that is abnormal or we need to change your treatment, we will call you to review the results.  Testing/Procedures: Echocardiogram Your physician has requested that you have an echocardiogram. Echocardiography is a painless test that uses sound waves to create images of your heart. It provides your doctor with information about the size and shape of your heart and how well your heart's chambers and valves are working. This procedure takes approximately one hour. There are no restrictions for this procedure. Please do NOT wear cologne, perfume, aftershave, or lotions (deodorant is allowed). Please arrive 15 minutes prior to your appointment time.  Please note: We ask at that you not bring children with you during ultrasound (echo/ vascular) testing. Due to room size and safety concerns, children are not allowed in the ultrasound rooms during exams. Our front office staff cannot provide observation of children in our lobby area while testing is being conducted. An adult accompanying a patient to their appointment will only be allowed in the ultrasound room at the discretion of the ultrasound technician under special circumstances. We apologize for any inconvenience.  3 Day Zio Heart Monitor Your physician has requested that you wear a Zio heart monitor for __3___ days. This will be mailed to your home with instructions on how to apply the monitor and how to return it when finished. Please allow 2  weeks after returning the heart monitor before our office calls you with the results.   Follow-Up: At [facility], you and your health needs are our priority.  As part of our continuing mission to provide you with exceptional heart care, our providers are all part of one team.  This team includes your primary Cardiologist (physician) and Advanced Practice Providers or APPs (Physician Assistants and Nurse Practitioners) who all work together to provide you with the care you need, when you need it.  Your next appointment:   6 month(s)  Provider:   APP  We recommend signing up for the patient portal called MyChart.  Sign up information is provided on this After Visit Summary.  MyChart is used to connect with patients for Virtual Visits (Telemedicine).  Patients are able to view lab/test results, encounter notes, upcoming appointments, etc.  Non-urgent messages can be sent to your provider as well.   To learn more about what you can do with MyChart, go to [email or web address].   Other Instructions ZIO XT- Long Term Monitor Instructions  Your physician has requested you wear a ZIO patch monitor for 3 days.  This is a single patch monitor. [employer] supplies one patch monitor per enrollment. Additional stickers are not available. Please do not apply patch if you will be having a Nuclear Stress Test,  Echocardiogram, Cardiac CT, MRI, or Chest Xray during the period you would be wearing the  monitor. The patch cannot be worn during these tests. You cannot remove and re-apply the  ZIO XT patch monitor.  Your ZIO patch monitor will be mailed 3 day USPS to your  address on file. It may take 3-5 days  to receive your monitor after you have been enrolled.  Once you have received your monitor, please review the enclosed instructions. Your monitor  has already been registered assigning a specific monitor serial # to you.  Billing and Patient Assistance Program Information  We have supplied  [employer] with any of your insurance information on file for billing purposes. [employer] offers a sliding scale Patient Assistance Program for patients that do not have  insurance, or whose insurance does not completely cover the cost of the ZIO monitor.  You must apply for the Patient Assistance Program to qualify for this discounted rate.  To apply, please call [employer] at [phone], select option 4, select option 2, ask to apply for  Patient Assistance Program. Meredeth will ask your household income, and how many people  are in your household. They will quote your out-of-pocket cost based on that information.  [employer] will also be able to set up a 7-month, interest-free payment plan if needed.  Applying the monitor   Shave hair from upper left chest.  Hold abrader disc by orange tab. Rub abrader in 40 strokes over the upper left chest as  indicated in your monitor instructions.  Clean area with 4 enclosed alcohol pads. Let dry.  Apply patch as indicated in monitor instructions. Patch will be placed under collarbone on left  side of chest with arrow pointing upward.  Rub patch adhesive wings for 2 minutes. Remove white label marked 1. Remove the white  label marked 2. Rub patch adhesive wings for 2 additional minutes.  While looking in a mirror, press and release button in center of patch. A small green light will  flash 3-4 times. This will be your only indicator that the monitor has been turned on.  Do not shower for the first 24 hours. You may shower after the first 24 hours.  Press the button if you feel a symptom. You will hear a small click. Record Date, Time and  Symptom in the Patient Logbook.  When you are ready to remove the patch, follow instructions on the last 2 pages of Patient  Logbook. Stick patch monitor onto the last page of Patient Logbook.  Place Patient Logbook in the blue and white box. Use locking tab on box and tape box closed  securely. The blue and white box  has prepaid postage on it. Please place it in the mailbox as  soon as possible. Your physician should have your test results approximately 7 days after the  monitor has been mailed back to [facility].  Call [facility] Customer Care at [phone] if you have questions regarding  your ZIO XT patch monitor. Call them immediately if you see an orange light blinking on your  monitor.  If your monitor falls off in less than 4 days, contact our Monitor department at [phone].  If your monitor becomes loose or falls off after 4 days call [employer] at [phone] for  suggestions on securing your monitor

## 2024-09-03 NOTE — Progress Notes (Unsigned)
 Enrolled patient for a 3 day Zio XT monitor to be mailed to patients home

## 2024-09-21 DIAGNOSIS — R001 Bradycardia, unspecified: Secondary | ICD-10-CM

## 2024-09-23 ENCOUNTER — Ambulatory Visit: Payer: Self-pay | Admitting: Cardiology

## 2024-10-14 ENCOUNTER — Ambulatory Visit (HOSPITAL_COMMUNITY): Attending: Internal Medicine

## 2024-10-14 ENCOUNTER — Encounter (HOSPITAL_COMMUNITY): Payer: Self-pay | Admitting: Cardiology

## 2024-10-22 ENCOUNTER — Ambulatory Visit (HOSPITAL_COMMUNITY)
Admission: RE | Admit: 2024-10-22 | Discharge: 2024-10-22 | Disposition: A | Discharge status: Home / Self Care | Source: Ambulatory Visit | Attending: Cardiology | Admitting: Cardiology

## 2024-10-22 DIAGNOSIS — I340 Nonrheumatic mitral (valve) insufficiency: Secondary | ICD-10-CM | POA: Diagnosis not present

## 2024-10-22 DIAGNOSIS — R001 Bradycardia, unspecified: Secondary | ICD-10-CM | POA: Insufficient documentation

## 2024-10-22 LAB — ECHOCARDIOGRAM COMPLETE
Area-P 1/2: 4.63 cm2
S' Lateral: 3.3 cm
